# Patient Record
Sex: Female | Born: 1960 | Race: Black or African American | Hispanic: No | Marital: Single | Smoking: Never smoker
Health system: Southern US, Community
[De-identification: ages and names within clinical notes are randomized; demographics above are authoritative.]

## PROBLEM LIST (undated history)

## (undated) DIAGNOSIS — R7303 Prediabetes: Secondary | ICD-10-CM

## (undated) DIAGNOSIS — M549 Dorsalgia, unspecified: Secondary | ICD-10-CM

## (undated) DIAGNOSIS — E785 Hyperlipidemia, unspecified: Secondary | ICD-10-CM

## (undated) DIAGNOSIS — D649 Anemia, unspecified: Secondary | ICD-10-CM

## (undated) DIAGNOSIS — G709 Myoneural disorder, unspecified: Secondary | ICD-10-CM

## (undated) DIAGNOSIS — F419 Anxiety disorder, unspecified: Secondary | ICD-10-CM

## (undated) DIAGNOSIS — T7840XA Allergy, unspecified, initial encounter: Secondary | ICD-10-CM

## (undated) DIAGNOSIS — M1990 Unspecified osteoarthritis, unspecified site: Secondary | ICD-10-CM

## (undated) DIAGNOSIS — J209 Acute bronchitis, unspecified: Secondary | ICD-10-CM

## (undated) DIAGNOSIS — R600 Localized edema: Secondary | ICD-10-CM

## (undated) DIAGNOSIS — I10 Essential (primary) hypertension: Secondary | ICD-10-CM

## (undated) DIAGNOSIS — M2550 Pain in unspecified joint: Secondary | ICD-10-CM

## (undated) HISTORY — DX: Hyperlipidemia, unspecified: E78.5

## (undated) HISTORY — PX: BREAST LUMPECTOMY: SHX2

## (undated) HISTORY — PX: MYOMECTOMY: SHX85

## (undated) HISTORY — PX: COLONOSCOPY: SHX174

## (undated) HISTORY — DX: Myoneural disorder, unspecified: G70.9

## (undated) HISTORY — DX: Acute bronchitis, unspecified: J20.9

## (undated) HISTORY — DX: Dorsalgia, unspecified: M54.9

## (undated) HISTORY — DX: Prediabetes: R73.03

## (undated) HISTORY — DX: Allergy, unspecified, initial encounter: T78.40XA

## (undated) HISTORY — DX: Essential (primary) hypertension: I10

## (undated) HISTORY — DX: Localized edema: R60.0

## (undated) HISTORY — DX: Unspecified osteoarthritis, unspecified site: M19.90

## (undated) HISTORY — DX: Anxiety disorder, unspecified: F41.9

## (undated) HISTORY — DX: Pain in unspecified joint: M25.50

## (undated) HISTORY — PX: POLYPECTOMY: SHX149

## (undated) HISTORY — DX: Anemia, unspecified: D64.9

---

## 1996-04-21 HISTORY — PX: ABDOMINAL HYSTERECTOMY: SHX81

## 1998-10-22 ENCOUNTER — Ambulatory Visit (HOSPITAL_COMMUNITY): Admission: RE | Admit: 1998-10-22 | Discharge: 1998-10-22 | Payer: Self-pay | Admitting: Endocrinology

## 1998-10-22 ENCOUNTER — Encounter: Payer: Self-pay | Admitting: Endocrinology

## 1998-11-29 ENCOUNTER — Ambulatory Visit (HOSPITAL_COMMUNITY): Admission: RE | Admit: 1998-11-29 | Discharge: 1998-11-29 | Payer: Self-pay | Admitting: Nurse Practitioner

## 1998-11-29 ENCOUNTER — Encounter: Payer: Self-pay | Admitting: Internal Medicine

## 1998-12-06 ENCOUNTER — Ambulatory Visit (HOSPITAL_COMMUNITY): Admission: RE | Admit: 1998-12-06 | Discharge: 1998-12-06 | Payer: Self-pay | Admitting: Nurse Practitioner

## 1998-12-12 ENCOUNTER — Encounter (INDEPENDENT_AMBULATORY_CARE_PROVIDER_SITE_OTHER): Payer: Self-pay | Admitting: Specialist

## 1998-12-12 ENCOUNTER — Other Ambulatory Visit: Admission: RE | Admit: 1998-12-12 | Discharge: 1998-12-12 | Payer: Self-pay | Admitting: Gastroenterology

## 2001-09-27 ENCOUNTER — Other Ambulatory Visit: Admission: RE | Admit: 2001-09-27 | Discharge: 2001-09-27 | Payer: Self-pay | Admitting: Family Medicine

## 2004-05-06 ENCOUNTER — Ambulatory Visit: Payer: Self-pay | Admitting: Family Medicine

## 2004-05-09 ENCOUNTER — Ambulatory Visit: Payer: Self-pay | Admitting: Family Medicine

## 2004-05-13 ENCOUNTER — Ambulatory Visit: Payer: Self-pay | Admitting: Family Medicine

## 2004-05-16 ENCOUNTER — Ambulatory Visit: Payer: Self-pay

## 2004-11-18 ENCOUNTER — Ambulatory Visit: Payer: Self-pay | Admitting: Family Medicine

## 2004-11-18 ENCOUNTER — Encounter: Admission: RE | Admit: 2004-11-18 | Discharge: 2004-11-18 | Payer: Self-pay | Admitting: Family Medicine

## 2004-12-05 ENCOUNTER — Ambulatory Visit: Payer: Self-pay | Admitting: Family Medicine

## 2004-12-05 ENCOUNTER — Other Ambulatory Visit: Admission: RE | Admit: 2004-12-05 | Discharge: 2004-12-05 | Payer: Self-pay | Admitting: Family Medicine

## 2005-09-18 ENCOUNTER — Ambulatory Visit: Payer: Self-pay | Admitting: Family Medicine

## 2005-10-07 ENCOUNTER — Ambulatory Visit: Payer: Self-pay | Admitting: Family Medicine

## 2005-10-30 ENCOUNTER — Ambulatory Visit: Payer: Self-pay

## 2005-10-30 ENCOUNTER — Ambulatory Visit: Payer: Self-pay | Admitting: Family Medicine

## 2005-10-30 ENCOUNTER — Encounter: Payer: Self-pay | Admitting: Cardiology

## 2005-11-21 ENCOUNTER — Ambulatory Visit: Payer: Self-pay | Admitting: Family Medicine

## 2005-11-24 ENCOUNTER — Ambulatory Visit: Payer: Self-pay

## 2005-12-16 ENCOUNTER — Ambulatory Visit: Payer: Self-pay | Admitting: Family Medicine

## 2006-01-02 ENCOUNTER — Ambulatory Visit: Payer: Self-pay | Admitting: Family Medicine

## 2006-02-25 ENCOUNTER — Ambulatory Visit: Payer: Self-pay | Admitting: Family Medicine

## 2006-05-22 ENCOUNTER — Ambulatory Visit: Payer: Self-pay | Admitting: Family Medicine

## 2006-05-22 LAB — CONVERTED CEMR LAB
BUN: 14 mg/dL (ref 6–23)
CO2: 32 meq/L (ref 19–32)
Calcium: 9.8 mg/dL (ref 8.4–10.5)
Chloride: 102 meq/L (ref 96–112)
Creatinine, Ser: 0.84 mg/dL (ref 0.40–1.20)
Glucose, Bld: 95 mg/dL (ref 70–99)
Potassium: 4.2 meq/L (ref 3.5–5.3)
Sodium: 139 meq/L (ref 135–145)

## 2006-06-23 ENCOUNTER — Ambulatory Visit: Payer: Self-pay | Admitting: Family Medicine

## 2006-08-05 ENCOUNTER — Encounter: Payer: Self-pay | Admitting: Family Medicine

## 2006-08-05 ENCOUNTER — Ambulatory Visit: Payer: Self-pay | Admitting: Family Medicine

## 2006-08-05 DIAGNOSIS — I209 Angina pectoris, unspecified: Secondary | ICD-10-CM | POA: Insufficient documentation

## 2006-08-05 DIAGNOSIS — J309 Allergic rhinitis, unspecified: Secondary | ICD-10-CM | POA: Insufficient documentation

## 2006-08-05 DIAGNOSIS — R05 Cough: Secondary | ICD-10-CM

## 2006-08-05 DIAGNOSIS — R079 Chest pain, unspecified: Secondary | ICD-10-CM | POA: Insufficient documentation

## 2006-08-05 DIAGNOSIS — E785 Hyperlipidemia, unspecified: Secondary | ICD-10-CM | POA: Insufficient documentation

## 2006-08-05 DIAGNOSIS — E1169 Type 2 diabetes mellitus with other specified complication: Secondary | ICD-10-CM | POA: Insufficient documentation

## 2006-08-05 DIAGNOSIS — I10 Essential (primary) hypertension: Secondary | ICD-10-CM | POA: Insufficient documentation

## 2006-08-05 DIAGNOSIS — R059 Cough, unspecified: Secondary | ICD-10-CM | POA: Insufficient documentation

## 2006-08-13 ENCOUNTER — Ambulatory Visit: Payer: Self-pay | Admitting: Cardiology

## 2007-02-05 ENCOUNTER — Encounter: Payer: Self-pay | Admitting: Family Medicine

## 2007-02-18 ENCOUNTER — Encounter (INDEPENDENT_AMBULATORY_CARE_PROVIDER_SITE_OTHER): Payer: Self-pay | Admitting: *Deleted

## 2007-06-07 ENCOUNTER — Ambulatory Visit: Payer: Self-pay | Admitting: Family Medicine

## 2007-06-07 DIAGNOSIS — R609 Edema, unspecified: Secondary | ICD-10-CM | POA: Insufficient documentation

## 2007-06-11 ENCOUNTER — Telehealth (INDEPENDENT_AMBULATORY_CARE_PROVIDER_SITE_OTHER): Payer: Self-pay | Admitting: *Deleted

## 2007-06-17 ENCOUNTER — Ambulatory Visit: Payer: Self-pay | Admitting: Family Medicine

## 2007-06-17 DIAGNOSIS — J189 Pneumonia, unspecified organism: Secondary | ICD-10-CM | POA: Insufficient documentation

## 2007-06-25 ENCOUNTER — Ambulatory Visit: Payer: Self-pay | Admitting: Family Medicine

## 2007-07-13 ENCOUNTER — Telehealth (INDEPENDENT_AMBULATORY_CARE_PROVIDER_SITE_OTHER): Payer: Self-pay | Admitting: *Deleted

## 2007-07-14 ENCOUNTER — Telehealth: Payer: Self-pay | Admitting: Family Medicine

## 2007-07-14 ENCOUNTER — Encounter: Payer: Self-pay | Admitting: Family Medicine

## 2007-07-19 ENCOUNTER — Telehealth (INDEPENDENT_AMBULATORY_CARE_PROVIDER_SITE_OTHER): Payer: Self-pay | Admitting: *Deleted

## 2007-07-19 ENCOUNTER — Ambulatory Visit: Payer: Self-pay | Admitting: Family Medicine

## 2007-07-19 DIAGNOSIS — J209 Acute bronchitis, unspecified: Secondary | ICD-10-CM | POA: Insufficient documentation

## 2007-07-19 DIAGNOSIS — J90 Pleural effusion, not elsewhere classified: Secondary | ICD-10-CM | POA: Insufficient documentation

## 2007-07-19 LAB — CONVERTED CEMR LAB: Inflenza A Ag: NEGATIVE

## 2007-07-20 ENCOUNTER — Ambulatory Visit: Payer: Self-pay | Admitting: Internal Medicine

## 2007-07-23 ENCOUNTER — Telehealth (INDEPENDENT_AMBULATORY_CARE_PROVIDER_SITE_OTHER): Payer: Self-pay | Admitting: *Deleted

## 2007-08-02 ENCOUNTER — Telehealth (INDEPENDENT_AMBULATORY_CARE_PROVIDER_SITE_OTHER): Payer: Self-pay | Admitting: *Deleted

## 2007-08-02 ENCOUNTER — Encounter: Payer: Self-pay | Admitting: Family Medicine

## 2007-09-16 ENCOUNTER — Telehealth (INDEPENDENT_AMBULATORY_CARE_PROVIDER_SITE_OTHER): Payer: Self-pay | Admitting: *Deleted

## 2007-10-18 ENCOUNTER — Ambulatory Visit: Payer: Self-pay | Admitting: Family Medicine

## 2007-10-18 DIAGNOSIS — M79609 Pain in unspecified limb: Secondary | ICD-10-CM | POA: Insufficient documentation

## 2008-01-18 ENCOUNTER — Ambulatory Visit: Payer: Self-pay | Admitting: Family Medicine

## 2008-01-21 ENCOUNTER — Telehealth (INDEPENDENT_AMBULATORY_CARE_PROVIDER_SITE_OTHER): Payer: Self-pay | Admitting: *Deleted

## 2008-01-30 LAB — CONVERTED CEMR LAB
ALT: 39 units/L — ABNORMAL HIGH (ref 0–35)
AST: 40 units/L — ABNORMAL HIGH (ref 0–37)
Albumin: 3.7 g/dL (ref 3.5–5.2)
Alkaline Phosphatase: 53 units/L (ref 39–117)
BUN: 11 mg/dL (ref 6–23)
Bilirubin, Direct: 0.1 mg/dL (ref 0.0–0.3)
CO2: 30 meq/L (ref 19–32)
Calcium: 9.1 mg/dL (ref 8.4–10.5)
Chloride: 100 meq/L (ref 96–112)
Cholesterol: 144 mg/dL (ref 0–200)
Creatinine, Ser: 0.8 mg/dL (ref 0.4–1.2)
GFR calc Af Amer: 98 mL/min
GFR calc non Af Amer: 81 mL/min
Glucose, Bld: 89 mg/dL (ref 70–99)
HDL: 46.1 mg/dL (ref >39.0)
Hgb A1c MFr Bld: 6.5 % — ABNORMAL HIGH (ref 4.6–6.0)
LDL Cholesterol: 82 mg/dL (ref 0–99)
Potassium: 4 meq/L (ref 3.5–5.1)
Sodium: 138 meq/L (ref 135–145)
Total Bilirubin: 0.5 mg/dL (ref 0.3–1.2)
Total CHOL/HDL Ratio: 3.1
Total Protein: 6.9 g/dL (ref 6.0–8.3)
Triglycerides: 81 mg/dL (ref 0–149)
VLDL: 16 mg/dL (ref 0–40)

## 2008-01-31 ENCOUNTER — Encounter (INDEPENDENT_AMBULATORY_CARE_PROVIDER_SITE_OTHER): Payer: Self-pay | Admitting: *Deleted

## 2008-02-02 ENCOUNTER — Telehealth (INDEPENDENT_AMBULATORY_CARE_PROVIDER_SITE_OTHER): Payer: Self-pay | Admitting: *Deleted

## 2008-02-07 ENCOUNTER — Encounter (INDEPENDENT_AMBULATORY_CARE_PROVIDER_SITE_OTHER): Payer: Self-pay | Admitting: *Deleted

## 2008-02-07 ENCOUNTER — Encounter: Payer: Self-pay | Admitting: Family Medicine

## 2008-07-31 ENCOUNTER — Encounter: Payer: Self-pay | Admitting: Family Medicine

## 2008-08-15 IMAGING — CT CT ANGIO CHEST
2 of 4 series · 19 of 46 positions shown · IV contrast (Omnipaque 300)
Comparison: None

CLINICAL DATA: Elevated D-dimer.  Chest pain and pleural effusion.

CT ANGIOGRAPHY CHEST
TECHNIQUE: Multidetector CT imaging of the chest using the
standard protocol during bolus administration of intravenous
contrast. Multiplanar reconstructed images obtained and reviewed to
evaluate the vascular anatomy.
Contrast: 80 ml Omnipaque

[Series 5: pe_xxl 1.0 b20f st · axial · 0.74mm/px · z∈[-322,-106]mm · 16 of 238 slices shown]
[im 11/238  lung]
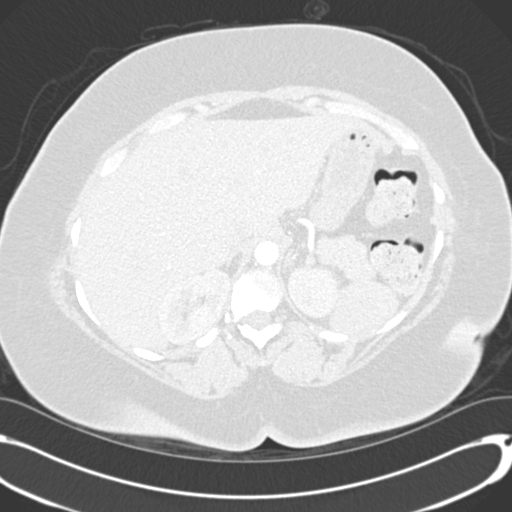
[im 31/238  soft-tissue]
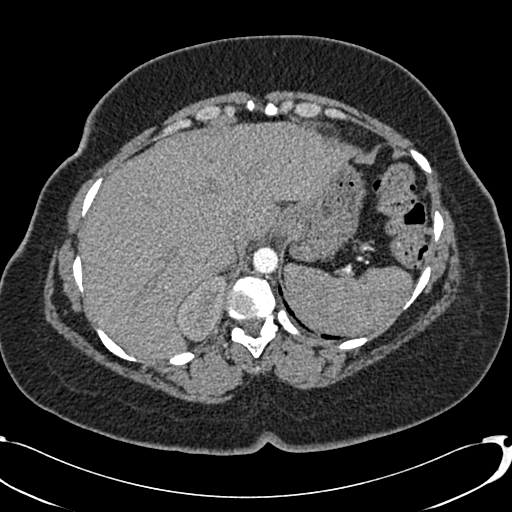
[im 42/238  lung]
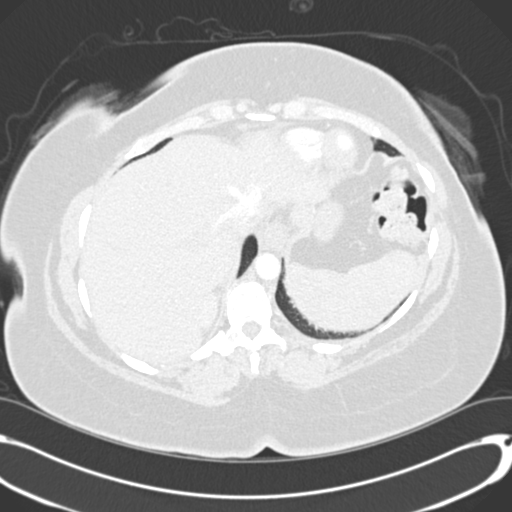
[im 52/238  soft-tissue]
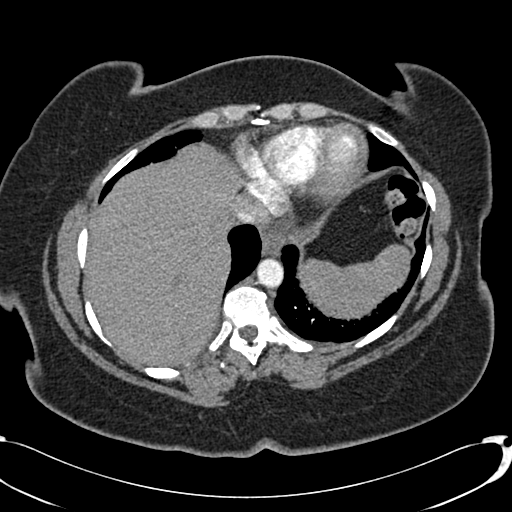
[im 73/238  lung]
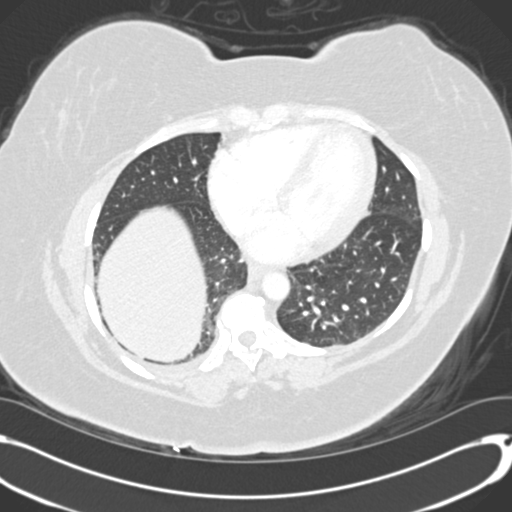
[im 83/238  soft-tissue]
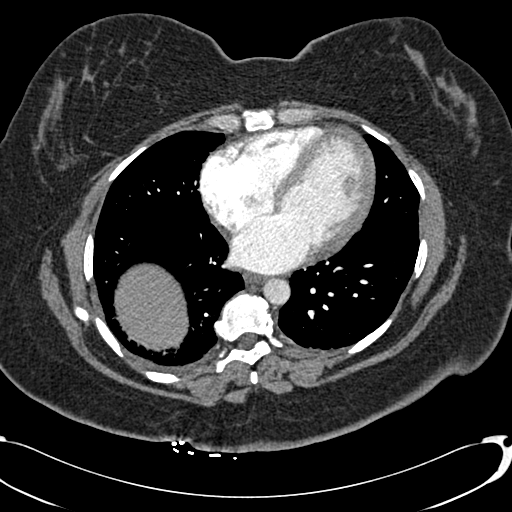
[im 93/238  lung]
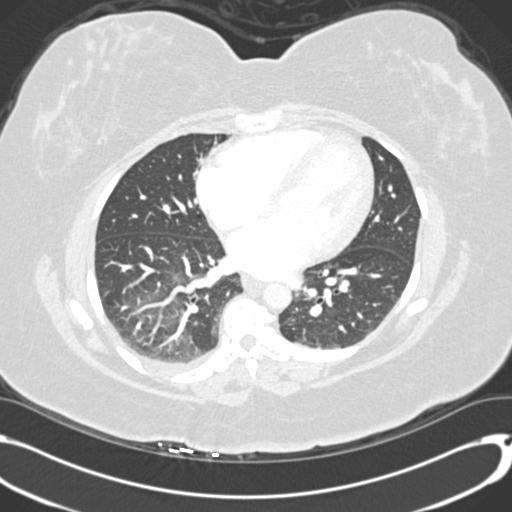
[im 114/238  soft-tissue]
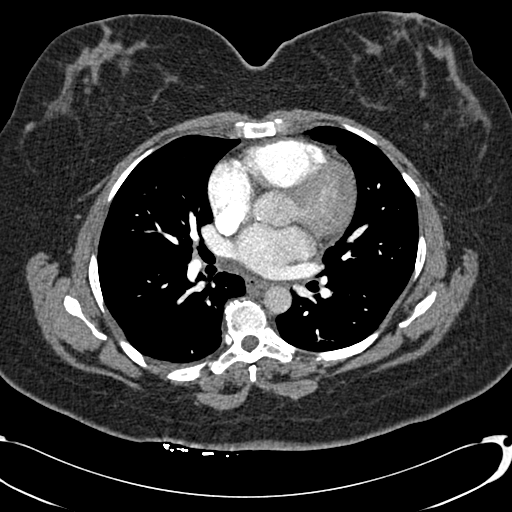
[im 124/238  lung]
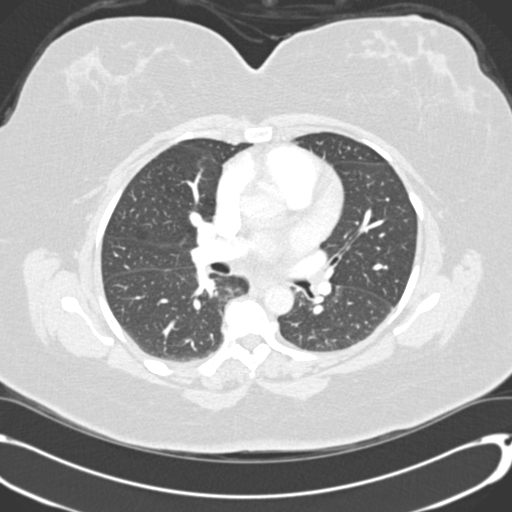
[im 145/238  soft-tissue]
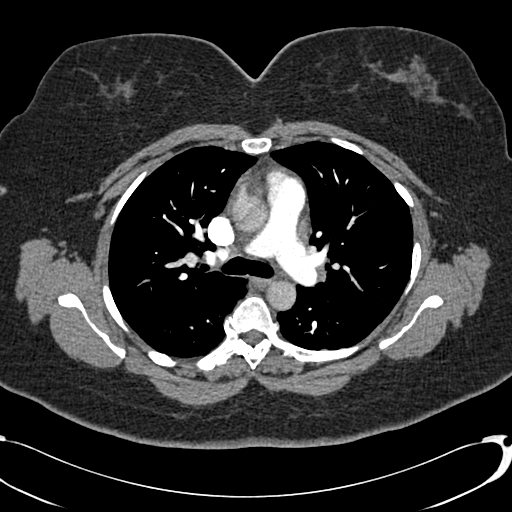
[im 155/238  lung]
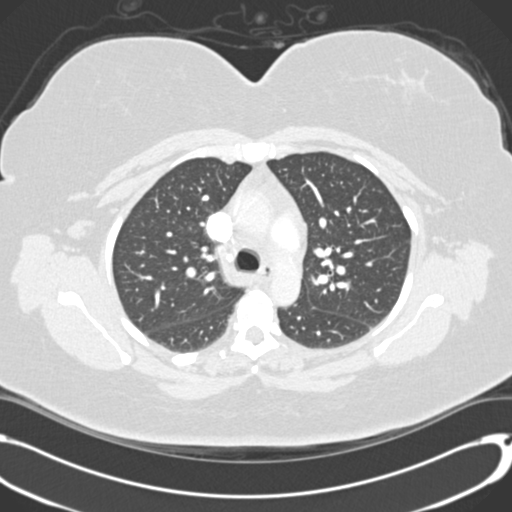
[im 165/238  soft-tissue]
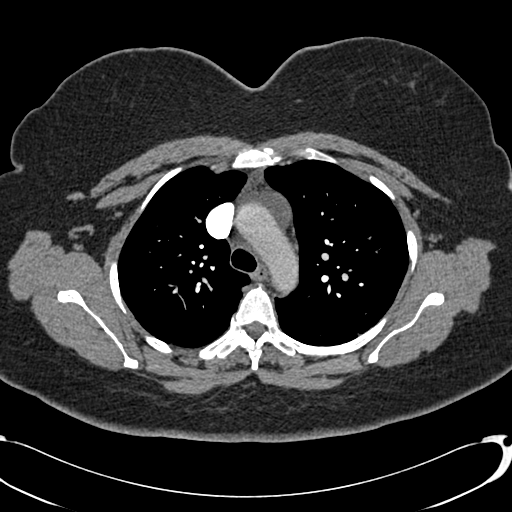
[im 186/238  lung]
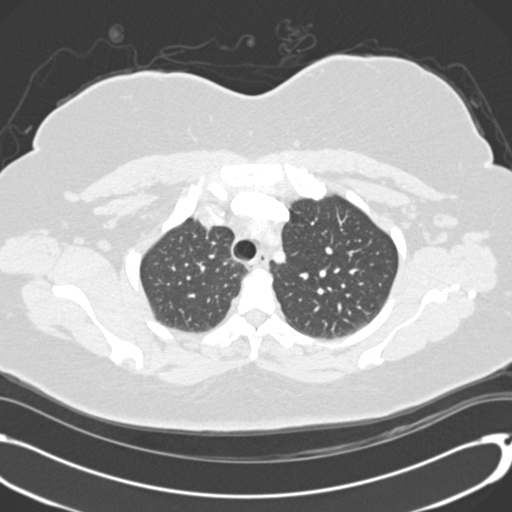
[im 196/238  soft-tissue]
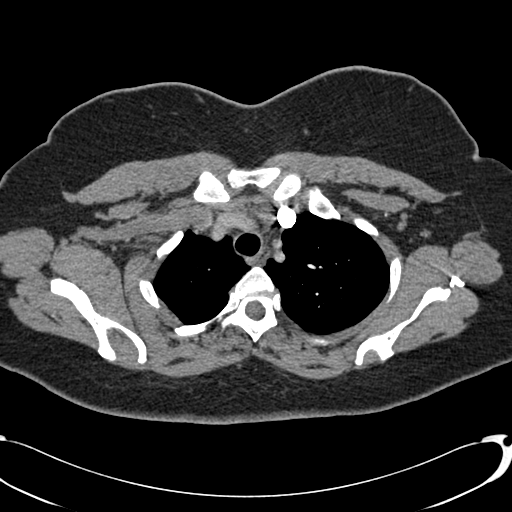
[im 207/238  lung]
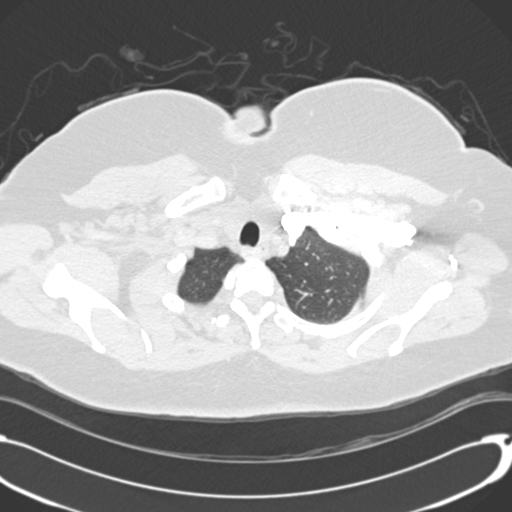
[im 227/238  soft-tissue]
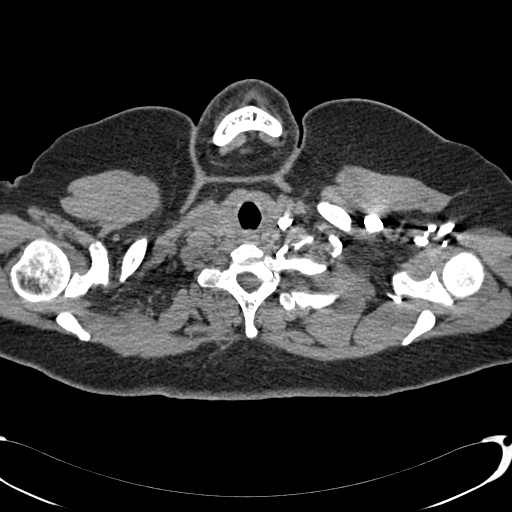

[Series 602: <mpr thick range> · coronal · 0.74mm/px · 3 of 106 slices shown]
[im 36/106  soft-tissue]
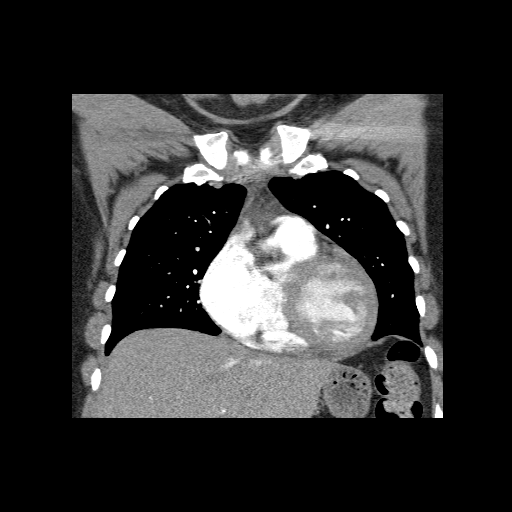
[im 47/106  soft-tissue]
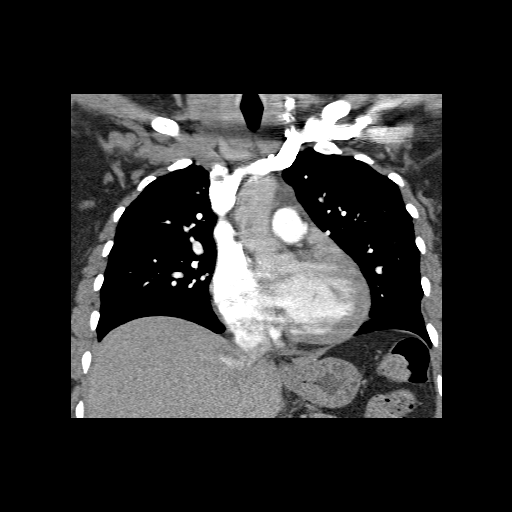
[im 59/106  soft-tissue]
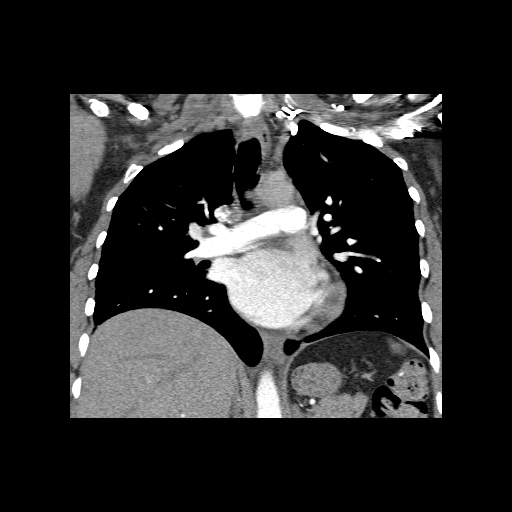

[19 of 46 positions shown; findings below may reference images not displayed]

FINDINGS: There are no abnormal filling defects within the main pulmonary
artery or its branches to suggest an acute pulmonary embolus.

No enlarged axillary lymph nodes.

No enlarged supraclavicular lymph nodes.

There is no pericardial effusion.  There is a small right effusion.

Tiny nodule within the right middle lobe measures 2.9 mm, image 35.

Adjacent nodule measures 3.1 mm, image 40.

At the right lung base there is a 2.1 mm nodule, image 47.

There is no abnormal areas of airspace consolidation.

Review of the visualized osseous structures is unremarkable.
IMPRESSION: 1.  No acute pulmonary embolus.
2.  Small nonspecific pulmonary nodules.  If this patient is at
risk for lung cancer I would recommend a follow-up examination in
12 months.

## 2008-08-24 ENCOUNTER — Ambulatory Visit: Payer: Self-pay | Admitting: Family Medicine

## 2008-08-24 ENCOUNTER — Other Ambulatory Visit: Admission: RE | Admit: 2008-08-24 | Discharge: 2008-08-24 | Payer: Self-pay | Admitting: Family Medicine

## 2008-08-24 ENCOUNTER — Encounter: Payer: Self-pay | Admitting: Family Medicine

## 2008-08-24 DIAGNOSIS — H409 Unspecified glaucoma: Secondary | ICD-10-CM | POA: Insufficient documentation

## 2008-08-24 DIAGNOSIS — R7309 Other abnormal glucose: Secondary | ICD-10-CM | POA: Insufficient documentation

## 2008-08-24 DIAGNOSIS — Z780 Asymptomatic menopausal state: Secondary | ICD-10-CM | POA: Insufficient documentation

## 2008-08-25 ENCOUNTER — Encounter (INDEPENDENT_AMBULATORY_CARE_PROVIDER_SITE_OTHER): Payer: Self-pay | Admitting: *Deleted

## 2008-08-28 ENCOUNTER — Encounter (INDEPENDENT_AMBULATORY_CARE_PROVIDER_SITE_OTHER): Payer: Self-pay | Admitting: *Deleted

## 2008-08-29 ENCOUNTER — Encounter: Payer: Self-pay | Admitting: Family Medicine

## 2008-09-08 ENCOUNTER — Encounter (INDEPENDENT_AMBULATORY_CARE_PROVIDER_SITE_OTHER): Payer: Self-pay | Admitting: *Deleted

## 2008-12-22 ENCOUNTER — Ambulatory Visit: Payer: Self-pay | Admitting: Family Medicine

## 2008-12-22 DIAGNOSIS — F438 Other reactions to severe stress: Secondary | ICD-10-CM | POA: Insufficient documentation

## 2008-12-26 ENCOUNTER — Ambulatory Visit: Payer: Self-pay

## 2008-12-26 ENCOUNTER — Encounter: Payer: Self-pay | Admitting: Family Medicine

## 2009-01-01 ENCOUNTER — Telehealth: Payer: Self-pay | Admitting: Family Medicine

## 2009-01-08 ENCOUNTER — Encounter: Payer: Self-pay | Admitting: Family Medicine

## 2009-02-27 ENCOUNTER — Ambulatory Visit: Payer: Self-pay | Admitting: Family Medicine

## 2009-02-27 ENCOUNTER — Telehealth (INDEPENDENT_AMBULATORY_CARE_PROVIDER_SITE_OTHER): Payer: Self-pay | Admitting: *Deleted

## 2009-03-05 ENCOUNTER — Encounter: Payer: Self-pay | Admitting: Family Medicine

## 2009-03-05 LAB — CONVERTED CEMR LAB
Chloride: 104 meq/L (ref 96–112)
GFR calc non Af Amer: 97.79 mL/min (ref >60)
Hgb A1c MFr Bld: 6.4 % (ref 4.6–6.5)
Potassium: 4.3 meq/L (ref 3.5–5.1)
Sodium: 142 meq/L (ref 135–145)

## 2009-03-30 ENCOUNTER — Encounter (INDEPENDENT_AMBULATORY_CARE_PROVIDER_SITE_OTHER): Payer: Self-pay | Admitting: *Deleted

## 2009-04-06 ENCOUNTER — Encounter: Payer: Self-pay | Admitting: Family Medicine

## 2009-04-11 ENCOUNTER — Encounter: Payer: Self-pay | Admitting: Family Medicine

## 2009-04-19 ENCOUNTER — Ambulatory Visit: Payer: Self-pay | Admitting: Family Medicine

## 2009-04-19 DIAGNOSIS — J0140 Acute pansinusitis, unspecified: Secondary | ICD-10-CM | POA: Insufficient documentation

## 2009-04-19 DIAGNOSIS — J019 Acute sinusitis, unspecified: Secondary | ICD-10-CM

## 2009-04-24 ENCOUNTER — Encounter: Payer: Self-pay | Admitting: Family Medicine

## 2009-05-16 ENCOUNTER — Telehealth (INDEPENDENT_AMBULATORY_CARE_PROVIDER_SITE_OTHER): Payer: Self-pay | Admitting: *Deleted

## 2009-05-22 ENCOUNTER — Ambulatory Visit: Payer: Self-pay | Admitting: Family

## 2009-05-29 ENCOUNTER — Encounter (INDEPENDENT_AMBULATORY_CARE_PROVIDER_SITE_OTHER): Payer: Self-pay | Admitting: *Deleted

## 2009-07-19 ENCOUNTER — Encounter: Payer: Self-pay | Admitting: Family Medicine

## 2009-07-25 ENCOUNTER — Encounter: Payer: Self-pay | Admitting: Family Medicine

## 2009-08-02 ENCOUNTER — Encounter (INDEPENDENT_AMBULATORY_CARE_PROVIDER_SITE_OTHER): Payer: Self-pay | Admitting: *Deleted

## 2009-08-30 ENCOUNTER — Ambulatory Visit: Payer: Self-pay | Admitting: Family Medicine

## 2009-08-30 ENCOUNTER — Telehealth (INDEPENDENT_AMBULATORY_CARE_PROVIDER_SITE_OTHER): Payer: Self-pay | Admitting: *Deleted

## 2009-08-31 ENCOUNTER — Encounter: Payer: Self-pay | Admitting: Family Medicine

## 2009-10-12 ENCOUNTER — Encounter (INDEPENDENT_AMBULATORY_CARE_PROVIDER_SITE_OTHER): Payer: Self-pay

## 2009-10-16 ENCOUNTER — Ambulatory Visit: Payer: Self-pay | Admitting: Gastroenterology

## 2009-10-30 ENCOUNTER — Ambulatory Visit: Payer: Self-pay | Admitting: Gastroenterology

## 2009-10-31 ENCOUNTER — Encounter: Payer: Self-pay | Admitting: Gastroenterology

## 2009-12-04 ENCOUNTER — Ambulatory Visit: Payer: Self-pay | Admitting: Family Medicine

## 2009-12-05 LAB — CONVERTED CEMR LAB
ALT: 14 units/L (ref 0–35)
AST: 17 units/L (ref 0–37)
Albumin: 3.9 g/dL (ref 3.5–5.2)
Calcium: 9.6 mg/dL (ref 8.4–10.5)
GFR calc non Af Amer: 111.88 mL/min (ref >60)
HDL: 47.8 mg/dL (ref >39.00)
Hgb A1c MFr Bld: 6.3 % (ref 4.6–6.5)
Sodium: 144 meq/L (ref 135–145)
Total Protein: 6.7 g/dL (ref 6.0–8.3)
Triglycerides: 59 mg/dL (ref 0.0–149.0)
VLDL: 11.8 mg/dL (ref 0.0–40.0)

## 2010-02-18 ENCOUNTER — Ambulatory Visit: Payer: Self-pay | Admitting: Family Medicine

## 2010-02-20 ENCOUNTER — Ambulatory Visit: Payer: Self-pay | Admitting: Family Medicine

## 2010-02-20 DIAGNOSIS — J029 Acute pharyngitis, unspecified: Secondary | ICD-10-CM | POA: Insufficient documentation

## 2010-05-19 LAB — CONVERTED CEMR LAB
Bilirubin Urine: NEGATIVE
Blood in Urine, dipstick: NEGATIVE
CO2: 31 meq/L (ref 19–32)
Calcium: 9.9 mg/dL (ref 8.4–10.5)
GFR calc non Af Amer: 97.99 mL/min (ref >60)
Glucose, Urine, Semiquant: NEGATIVE
Ketones, urine, test strip: NEGATIVE
Nitrite: NEGATIVE
Potassium: 4.4 meq/L (ref 3.5–5.1)
Protein, U semiquant: NEGATIVE
Sodium: 141 meq/L (ref 135–145)
Specific Gravity, Urine: 1.015
Urobilinogen, UA: NEGATIVE
WBC Urine, dipstick: NEGATIVE
pH: 5

## 2010-05-21 ENCOUNTER — Telehealth (INDEPENDENT_AMBULATORY_CARE_PROVIDER_SITE_OTHER): Payer: Self-pay | Admitting: *Deleted

## 2010-05-23 NOTE — Therapy (Signed)
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

## 2010-05-23 NOTE — Letter (Signed)
Summary: Primary Care Appointment Letter  [facility]  [address] [place], [place] [address]   Phone: [phone]  Fax: [phone]    08/02/2009 MRN: [id number]  Heather [name] [address] [place], [place]  [address]  Dear [name],   Your Primary Care Physician Loreen Freud DO has indicated that:    _______it is time to schedule an appointment.    _______you missed your appointment on______ and need to call and          reschedule.    __X_____you need to have lab work done.    _______you need to schedule an appointment discuss lab or test results.    _______you need to call to reschedule your appointment that is                       scheduled on _________.     Please call our office as soon as possible. Our phone number is [phone]________. Please press option 1. Our office is open 8a-12noon and 1p-5p, Monday through Friday.     Thank you,    [facility]

## 2010-05-23 NOTE — Progress Notes (Signed)
Summary: Prior Auth APPROVED  for [facility]  Phone Note Refill Request Call back at [phone] Message from:  Pharmacy on Aug 30, 2009 4:06 PM  Refills Requested: Medication #1:  LOVAZA 1 GM CAPS 2 by mouth two times a day.   Dosage confirmed as above?Dosage Confirmed   Supply Requested: 3 months Prior Auth [id number] ID#: [id number] [facility] [address]   Next Appointment Scheduled: today Initial call taken by: Harold Barban,  Aug 30, 2009 4:07 PM  Follow-up for Phone Call        Allena Katz [facility]  CASE ID #[id number]  PRIOR AUTH APPROVED FROM 08/10/09 TO 08/31/10 .Kandice Hams  Sep 04, 2009 9:17 AM  Follow-up by: Kandice Hams,  Sep 04, 2009 9:17 AM  Additional Follow-up for Phone Call Additional follow up Details #1::        [facility] [address] informed .Kandice Hams  Sep 04, 2009 9:18 AM

## 2010-05-23 NOTE — Assessment & Plan Note (Signed)
Summary: pain in rt side of mouth and throat///sph   Vital Signs:  Patient profile:   50 year old female Menstrual status:  hysterectomy Weight:      262.8 pounds Temp:     98.3 degrees F oral BP sitting:   120 / 80  (left arm) Cuff size:   large  Vitals Entered By: Almeta Monas CMA Duncan Dull) (February 20, 2010 11:58 AM) CC: c/o of pain on the right side of throat since this morning--pt on Augmentin since Monday   History of Present Illness: Pt here stating that she was feeling better but then this am she woke up with a sore throat and she feels a lump in the back of her throat.  See last visit.  Current Medications (verified): 1)  Lisinopril 10 Mg  Tabs (Lisinopril) .Marland Kitchen.. 1 By Mouth Once Daily 2)  Lasix 20 Mg  Tabs (Furosemide) .Marland Kitchen.. 1 By Mouth Once Daily 3)  Zocor 40 Mg  Tabs (Simvastatin) .Marland Kitchen.. 1 By Mouth At Bedtime. Needs Labwork. 4)  Combigan 0.2-0.5 % Soln (Brimonidine Tartrate-Timolol) 5)  [facility]vs Iron 325 (65 Fe) Mg Tabs (Ferrous Sulfate) .... Take One Tab By Mouth Once Daily 6)  Vitamin B-12 1000 Mcg Tabs (Cyanocobalamin) .... Take As Directed 7)  Calcium 500/d 500-200 Mg-Unit Tabs (Calcium Carbonate-Vitamin D) .... Take As Directed 8)  Combigan 0.2-0.5 % Soln (Brimonidine Tartrate-Timolol) .Marland Kitchen.. 1gtt Two Times A Day 9)  Vitamin D 400 Unit Caps (Cholecalciferol) .Marland Kitchen.. 1 By Mouth Once Daily 10)  Vitamin C 500 Mg Tabs (Ascorbic Acid) .Marland Kitchen.. 1 By Mouth Once Daily 11)  Multivitamins  Tabs (Multiple Vitamin) .Marland Kitchen.. 1 By Mouth Once Daily 12)  Alprazolam 0.25 Mg Tabs (Alprazolam) .Marland Kitchen.. 1 By Mouth Three Times A Day As Needed 13)  Veramyst 27.5 Mcg/spray Susp (Fluticasone Furoate) .... 2 Sprays Each Nostril Once Daily 14)  Cefdinir 300 Mg Caps (Cefdinir) .... One Cap By Mouth Two Times A Day 10 Days 15)  Tessalon Perles 100 Mg Caps (Benzonatate) .... One Cap By Mouth Three Times A Day 16)  Lovaza 1 Gm Caps (Omega-3-Acid Ethyl Esters) .... 2 By Mouth Two Times A Day 17)  Augmentin 875-125 Mg Tabs  (Amoxicillin-Pot Clavulanate) .Marland Kitchen.. 1 By Mouth Two Times A Day 18)  Prednisone 10 Mg Tabs (Prednisone) .... 3 By Mouth Once Daily For 3 Days Then 2 By Mouth Once Daily For 3 Days Then 1 By Mouth Once Daily For 3 Days  Allergies (verified): 1)  ! * Tamaflu  Past History:  Past medical, surgical, family and social histories (including risk factors) reviewed for relevance to current acute and chronic problems.  Past Medical History: Reviewed history from 08/24/2008 and no changes required. Allergic rhinitis Hyperlipidemia Hypertension Current Problems:  GLAUCOMA (ICD-365.9) LEG PAIN, LEFT (ICD-729.5) PLEURAL EFFUSION (ICD-511.9) ACUTE BRONCHITIS (ICD-466.0) PNEUMONIA, ORGANISM UNSPECIFIED (ICD-486) EDEMA (ICD-782.3) CHEST PAIN (ICD-786.50) COUGH (ICD-786.2) FAMILY HISTORY DIABETES 1ST DEGREE RELATIVE (ICD-V18.0) HYPERTENSION (ICD-401.9) HYPERLIPIDEMIA (ICD-272.4) ALLERGIC RHINITIS (ICD-477.9)  Past Surgical History: Reviewed history from 08/24/2008 and no changes required. Hysterectomy (1998)--TAH Lumpectomy  Family History: Reviewed history from 08/30/2009 and no changes required. Family History Diabetes 1st degree relative Family History High cholesterol Family History Hypertension Family History of Endometrial cancer Family History of Stroke M 1st degree relative at 51 yo secondary to a fib F--Dementia   Social History: Reviewed history from 08/24/2008 and no changes required. Occupation: Nurse, learning disability lab Never Smoked Divorced Alcohol use-no Drug use-no Regular exercise-yes  Review of Systems  See HPI  Physical Exam  General:  Well-developed,well-nourished,in no acute distress; alert,appropriate and cooperative throughout examination Mouth:  pharyngeal erythema.   tonsils slightly enlarged Neck:  supple and cervical lymphadenopathy.   Lungs:  Normal respiratory effort, chest expands symmetrically. Lungs are clear to auscultation, no crackles or  wheezes. Heart:  Normal rate and regular rhythm. S1 and S2 normal without gallop, murmur, click, rub or other extra sounds.   Impression & Recommendations:  Problem # 1:  SINUSITIS - ACUTE-NOS (ICD-461.9)  Her updated medication list for this problem includes:    Veramyst 27.5 Mcg/spray Susp (Fluticasone furoate) .Marland Kitchen... 2 sprays each nostril once daily    Cefdinir 300 Mg Caps (Cefdinir) ..... One cap by mouth two times a day 10 days    Tessalon Perles 100 Mg Caps (Benzonatate) ..... One cap by mouth three times a day    Augmentin 875-125 Mg Tabs (Amoxicillin-pot clavulanate) .Marland Kitchen... 1 by mouth two times a day  Instructed on treatment. Call if symptoms persist or worsen.   Problem # 2:  ACUTE PHARYNGITIS (ICD-462)  Her updated medication list for this problem includes:    Cefdinir 300 Mg Caps (Cefdinir) ..... One cap by mouth two times a day 10 days    Augmentin 875-125 Mg Tabs (Amoxicillin-pot clavulanate) .Marland Kitchen... 1 by mouth two times a day  Orders: Admin of Therapeutic Inj  intramuscular or subcutaneous (13086) Depo- Medrol 80mg  (J1040)  Complete Medication List: 1)  Lisinopril 10 Mg Tabs (Lisinopril) .Marland Kitchen.. 1 by mouth once daily 2)  Lasix 20 Mg Tabs (Furosemide) .Marland Kitchen.. 1 by mouth once daily 3)  Zocor 40 Mg Tabs (Simvastatin) .Marland Kitchen.. 1 by mouth at bedtime. needs labwork. 4)  Combigan 0.2-0.5 % Soln (Brimonidine tartrate-timolol) 5)  Cvs Iron 325 (65 Fe) Mg Tabs (Ferrous sulfate) .... Take one tab by mouth once daily 6)  Vitamin B-12 1000 Mcg Tabs (Cyanocobalamin) .... Take as directed 7)  Calcium 500/d 500-200 Mg-unit Tabs (Calcium carbonate-vitamin d) .... Take as directed 8)  Combigan 0.2-0.5 % Soln (Brimonidine tartrate-timolol) .Marland Kitchen.. 1gtt two times a day 9)  Vitamin D 400 Unit Caps (Cholecalciferol) .Marland Kitchen.. 1 by mouth once daily 10)  Vitamin C 500 Mg Tabs (Ascorbic acid) .Marland Kitchen.. 1 by mouth once daily 11)  Multivitamins Tabs (Multiple vitamin) .Marland Kitchen.. 1 by mouth once daily 12)  Alprazolam 0.25 Mg  Tabs (Alprazolam) .Marland Kitchen.. 1 by mouth three times a day as needed 13)  Veramyst 27.5 Mcg/spray Susp (Fluticasone furoate) .... 2 sprays each nostril once daily 14)  Cefdinir 300 Mg Caps (Cefdinir) .... One cap by mouth two times a day 10 days 15)  Tessalon Perles 100 Mg Caps (Benzonatate) .... One cap by mouth three times a day 16)  Lovaza 1 Gm Caps (Omega-3-acid ethyl esters) .... 2 by mouth two times a day 17)  Augmentin 875-125 Mg Tabs (Amoxicillin-pot clavulanate) .Marland Kitchen.. 1 by mouth two times a day 18)  Prednisone 10 Mg Tabs (Prednisone) .... 3 by mouth once daily for 3 days then 2 by mouth once daily for 3 days then 1 by mouth once daily for 3 days Prescriptions: PREDNISONE 10 MG TABS (PREDNISONE) 3 by mouth once daily for 3 days then 2 by mouth once daily for 3 days then 1 by mouth once daily for 3 days  #18 x 0   Entered and Authorized by:   Loreen Freud DO   Signed by:   Loreen Freud DO on 02/20/2010   Method used:   Electronically to  [facility] [address] (retail)       [phone]       [place], [place]  [address]       Ph: [phone]       Fax: [phone]   RxID:   [id number]    Medication Administration  Injection # 1:    Medication: Depo- Medrol 80mg     Diagnosis: ACUTE PHARYNGITIS (ICD-462)    Route: IM    Site: RUOQ gluteus    Exp Date: 10/20/2010    Lot #: [id number]    Mfr: Pharmacia    Patient tolerated injection without complications    Given by: Almeta Monas CMA Duncan Dull) (February 20, 2010 12:22 PM)  Orders Added: 1)  Admin of Therapeutic Inj  intramuscular or subcutaneous [96372] 2)  Depo- Medrol 80mg  [J1040] 3)  Est. Patient Level III [76160]

## 2010-05-23 NOTE — Medication Information (Signed)
Summary: Prior Authorization for [employer]  Prior Authorization for [employer]   Imported By: Lanelle Bal 09/11/2009 12:16:03  _____________________________________________________________________  External Attachment:    Type:   Image     Comment:   External Document

## 2010-05-23 NOTE — Letter (Signed)
Summary: [facility] Instructions  [facility]  [address] [place], [place] [address]   Phone: [phone]  Fax: [phone]       Heather [name]    1961/03/13    MRN: [id number]        Procedure Day /Date: Tuesday 10/30/09      Arrival Time: 8:00 a.m.     Procedure Time: 9:00 a.m.     Location of Procedure:                    _x_  [facility] (4th Floor)   PREPARATION FOR COLONOSCOPY WITH MOVIPREP   Starting 5 days prior to your procedure  10/25/09 do not eat nuts, seeds, popcorn, corn, beans, peas,  salads, or any raw vegetables.  Do not take any fiber supplements (e.g. Metamucil, Citrucel, and Benefiber).  THE DAY BEFORE YOUR PROCEDURE         DATE:  10/29/09  DAY: Monday  1.  Drink clear liquids the entire day-NO SOLID FOOD  2.  Do not drink anything colored red or purple.  Avoid juices with pulp.  No orange juice.  3.  Drink at least 64 oz. (8 glasses) of fluid/clear liquids during the day to prevent dehydration and help the prep work efficiently.  CLEAR LIQUIDS INCLUDE: Water Jello Ice Popsicles Tea (sugar ok, no milk/cream) Powdered fruit flavored drinks Coffee (sugar ok, no milk/cream) Gatorade Juice: apple, white grape, white cranberry  Lemonade Clear bullion, consomm, broth Carbonated beverages (any kind) Strained chicken noodle soup Hard Candy                             4.  In the morning, mix first dose of MoviPrep solution:    Empty 1 Pouch A and 1 Pouch B into the disposable container    Add lukewarm drinking water to the top line of the container. Mix to dissolve    Refrigerate (mixed solution should be used within 24 hrs)  5.  Begin drinking the prep at 5:00 p.m. The MoviPrep container is divided by 4 marks.   Every 15 minutes drink the solution down to the next mark (approximately 8 oz) until the full liter is complete.   6.  Follow completed prep with 16 oz of clear liquid of your choice (Nothing red or purple).   Continue to drink clear liquids until bedtime.  7.  Before going to bed, mix second dose of MoviPrep solution:    Empty 1 Pouch A and 1 Pouch B into the disposable container    Add lukewarm drinking water to the top line of the container. Mix to dissolve    Refrigerate  THE DAY OF YOUR PROCEDURE      DATE:  10/30/09  DAY:  Tuesday  Beginning at  4:00 a.m. (5 hours before procedure):         1. Every 15 minutes, drink the solution down to the next mark (approx 8 oz) until the full liter is complete.  2. Follow completed prep with 16 oz. of clear liquid of your choice.    3. You may drink clear liquids until  7:00 a.m.  (2 HOURS BEFORE PROCEDURE).   MEDICATION INSTRUCTIONS  Unless otherwise instructed, you should take regular prescription medications with a small sip of water   as early as possible the morning of your procedure   Additional medication instructions: Do not take Furosemide am of  procedure.         OTHER INSTRUCTIONS  You will need a responsible adult at least 51 years of age to accompany you and drive you home.   This person must remain in the waiting room during your procedure.  Wear loose fitting clothing that is easily removed.  Leave jewelry and other valuables at home.  However, you may wish to bring a book to read or  an iPod/MP3 player to listen to music as you wait for your procedure to start.  Remove all body piercing jewelry and leave at home.  Total time from sign-in until discharge is approximately 2-3 hours.  You should go home directly after your procedure and rest.  You can resume normal activities the  day after your procedure.  The day of your procedure you should not:   Drive   Make legal decisions   Operate machinery   Drink alcohol   Return to work  You will receive specific instructions about eating, activities and medications before you leave.    The above instructions have been reviewed and explained to me by    Ulis Rias RN  October 16, 2009 4:51 PM     I fully understand and can verbalize these instructions _____________________________ Date _________

## 2010-05-23 NOTE — Procedures (Signed)
Summary: Colonoscopy  Patient: Heather [name] Note: All result statuses are Final unless otherwise noted.  Tests: (1) Colonoscopy (COL)   COL Colonoscopy           DONE     [facility]     [address]     [place], [place]  [address]           COLONOSCOPY PROCEDURE REPORT           PATIENT:  Heather, [name]  MR#:  [id number]     BIRTHDATE:  [birth year], 49 yrs. old  GENDER:  female     ENDOSCOPIST:  Judie Petit T. Russella Dar, MD, [facility]           PROCEDURE DATE:  10/30/2009     PROCEDURE:  Colonoscopy with snare polypectomy     ASA CLASS:  Class II     INDICATIONS:  1) Routine Risk Screening     MEDICATIONS:   Fentanyl 75 mcg IV, Versed 8 mg IV     DESCRIPTION OF PROCEDURE:   After the risks benefits and     alternatives of the procedure were thoroughly explained, informed     consent was obtained.  Digital rectal exam was performed and     revealed no abnormalities.   The LB [id number]-[id number] endoscope     was introduced through the anus and advanced to the cecum, which     was identified by both the appendix and ileocecal valve, limited     by fair prep, a tortuous colon.    The quality of the prep was     Moviprep fair.  The instrument was then slowly withdrawn as the     colon was fully examined.     <<PROCEDUREIMAGES>>     FINDINGS:  Moderate melanosis coli was found throughout the colon.     A sessile polyp was found in the cecum. It was 5 mm in size. Polyp     was snared, then cauterized with monopolar cautery. Retrieval was     successful. This was otherwise a normal examination of the colon.     Retroflexed views in the rectum revealed no abnormalities. The time     to cecum =  8  minutes. The scope was then withdrawn (time =  11.5     min) from the patient and the procedure completed.           COMPLICATIONS:  None           ENDOSCOPIC IMPRESSION:     1) Melanosis throughout the colon     2) 5 mm sessile polyp in the cecum           RECOMMENDATIONS:     1)  Await pathology results     2) No aspirin or NSAID's for 2 weeks     3) High fiber diet with liberal fluid intake.     4) Repeat Colonoscopy in 5 years due to fair prep and tortuous     colon, pending path review           Zamariah Seaborn T. Russella Dar, MD, [facility]           CC: Lelon Perla, DO           n.     eSIGNED:   Venita Lick. Julianne Chamberlin at 10/30/2009 09:33 AM           Berdine Addison, [id number]  Note: An exclamation mark (!) indicates a result that  was not dispersed into the flowsheet. Document Creation Date: 10/30/2009 9:35 AM _______________________________________________________________________  (1) Order result status: Final Collection or observation date-time: 10/30/2009 09:30 Requested date-time:  Receipt date-time:  Reported date-time:  Referring Physician:   Ordering Physician: Claudette Head [phone]) Specimen Source:  Source: Launa Grill Order Number: [id number] Lab site:   Appended Document: Colonoscopy     Colonoscopy  Procedure date:  10/30/2009  Findings:      Location:  [facility].    Procedures Next Due Date:    Colonoscopy: 10/2014

## 2010-05-23 NOTE — Assessment & Plan Note (Signed)
Summary: SINUS PAIN/RH.....   Vital Signs:  Patient profile:   50 year old female Menstrual status:  hysterectomy Height:      66 inches Weight:      265.0 pounds BMI:     42.93 Temp:     98.1 degrees F oral Pulse rate:   70 / minute Pulse rhythm:   regular BP sitting:   118 / 80  (right arm) Cuff size:   large  Vitals Entered By: Almeta Monas CMA Duncan Dull) (February 18, 2010 10:33 AM) CC: x4 days c/o sinus pain/pressure, teeth pain, congestion, cough, a dn yellow mucus, URI symptoms   History of Present Illness:       This is a 50 year old woman who presents with URI symptoms.  The symptoms began 5 days ago.  Pt was taking advil and zyrtec with flonase and neti pot --- no relief.  The patient complains of nasal congestion, purulent nasal discharge, productive cough, earache, and sick contacts, but denies clear nasal discharge, sore throat, and dry cough.  Associated symptoms include fever.  The patient denies low-grade fever (<100.5 degrees), fever of 100.5-103 degrees, fever of 103.1-104 degrees, fever to >104 degrees, stiff neck, dyspnea, wheezing, rash, vomiting, diarrhea, use of an antipyretic, and response to antipyretic.  The patient also reports sneezing and headache.  The patient denies itchy watery eyes, itchy throat, seasonal symptoms, response to antihistamine, muscle aches, and severe fatigue.  Risk factors for Strep sinusitis include tooth pain.  The patient denies the following risk factors for Strep sinusitis: unilateral facial pain, unilateral nasal discharge, poor response to decongestant, double sickening, Strep exposure, tender adenopathy, and absence of cough.    Current Medications (verified): 1)  Lisinopril 10 Mg  Tabs (Lisinopril) .Marland Kitchen.. 1 By Mouth Once Daily 2)  Lasix 20 Mg  Tabs (Furosemide) .Marland Kitchen.. 1 By Mouth Once Daily 3)  Zocor 40 Mg  Tabs (Simvastatin) .Marland Kitchen.. 1 By Mouth At Bedtime. Needs Labwork. 4)  Combigan 0.2-0.5 % Soln (Brimonidine Tartrate-Timolol) 5)  Cvs Iron  325 (65 Fe) Mg Tabs (Ferrous Sulfate) .... Take One Tab By Mouth Once Daily 6)  Vitamin B-12 1000 Mcg Tabs (Cyanocobalamin) .... Take As Directed 7)  Calcium 500/d 500-200 Mg-Unit Tabs (Calcium Carbonate-Vitamin D) .... Take As Directed 8)  Combigan 0.2-0.5 % Soln (Brimonidine Tartrate-Timolol) .Marland Kitchen.. 1gtt Two Times A Day 9)  Vitamin D 400 Unit Caps (Cholecalciferol) .Marland Kitchen.. 1 By Mouth Once Daily 10)  Vitamin C 500 Mg Tabs (Ascorbic Acid) .Marland Kitchen.. 1 By Mouth Once Daily 11)  Multivitamins  Tabs (Multiple Vitamin) .Marland Kitchen.. 1 By Mouth Once Daily 12)  Alprazolam 0.25 Mg Tabs (Alprazolam) .Marland Kitchen.. 1 By Mouth Three Times A Day As Needed 13)  Flonase 50 Mcg/act Susp (Fluticasone Propionate) .... 2 Sprays Each Nostril Once Daily 14)  Cefdinir 300 Mg Caps (Cefdinir) .... One Cap By Mouth Two Times A Day 10 Days 15)  Tessalon Perles 100 Mg Caps (Benzonatate) .... One Cap By Mouth Three Times A Day 16)  Lovaza 1 Gm Caps (Omega-3-Acid Ethyl Esters) .... 2 By Mouth Two Times A Day 17)  Augmentin 875-125 Mg Tabs (Amoxicillin-Pot Clavulanate) .Marland Kitchen.. 1 By Mouth Two Times A Day  Allergies (verified): 1)  ! * Tamaflu  Past History:  Past medical, surgical, family and social histories (including risk factors) reviewed for relevance to current acute and chronic problems.  Past Medical History: Reviewed history from 08/24/2008 and no changes required. Allergic rhinitis Hyperlipidemia Hypertension Current Problems:  GLAUCOMA (ICD-365.9) LEG PAIN,  LEFT (ICD-729.5) PLEURAL EFFUSION (ICD-511.9) ACUTE BRONCHITIS (ICD-466.0) PNEUMONIA, ORGANISM UNSPECIFIED (ICD-486) EDEMA (ICD-782.3) CHEST PAIN (ICD-786.50) COUGH (ICD-786.2) FAMILY HISTORY DIABETES 1ST DEGREE RELATIVE (ICD-V18.0) HYPERTENSION (ICD-401.9) HYPERLIPIDEMIA (ICD-272.4) ALLERGIC RHINITIS (ICD-477.9)  Past Surgical History: Reviewed history from 08/24/2008 and no changes required. Hysterectomy (1998)--TAH Lumpectomy  Family History: Reviewed history from  08/30/2009 and no changes required. Family History Diabetes 1st degree relative Family History High cholesterol Family History Hypertension Family History of Endometrial cancer Family History of Stroke M 1st degree relative at 51 yo secondary to a fib F--Dementia   Social History: Reviewed history from 08/24/2008 and no changes required. Occupation: Nurse, learning disability lab Never Smoked Divorced Alcohol use-no Drug use-no Regular exercise-yes  Review of Systems      See HPI  Physical Exam  General:  Well-developed,well-nourished,in no acute distress; alert,appropriate and cooperative throughout examination Ears:  External ear exam shows no significant lesions or deformities.  Otoscopic examination reveals clear canals, tympanic membranes are intact bilaterally without bulging, retraction, inflammation or discharge. Hearing is grossly normal bilaterally. Nose:  L frontal sinus tenderness, L maxillary sinus tenderness, R frontal sinus tenderness, and R maxillary sinus tenderness.   Mouth:  Oral mucosa and oropharynx without lesions or exudates.  Teeth in good repair. Neck:  supple.   Lungs:  Normal respiratory effort, chest expands symmetrically. Lungs are clear to auscultation, no crackles or wheezes. Heart:  Normal rate and regular rhythm. S1 and S2 normal without gallop, murmur, click, rub or other extra sounds. Cervical Nodes:  R anterior LN enlarged and L anterior LN enlarged.   Psych:  Cognition and judgment appear intact. Alert and cooperative with normal attention span and concentration. No apparent delusions, illusions, hallucinations   Impression & Recommendations:  Problem # 1:  SINUSITIS - ACUTE-NOS (ICD-461.9)  Her updated medication list for this problem includes:    Veramyst 27.5 Mcg/spray Susp (Fluticasone furoate) .Marland Kitchen... 2 sprays each nostril once daily    Cefdinir 300 Mg Caps (Cefdinir) ..... One cap by mouth two times a day 10 days    Tessalon Perles 100 Mg Caps  (Benzonatate) ..... One cap by mouth three times a day    Augmentin 875-125 Mg Tabs (Amoxicillin-pot clavulanate) .Marland Kitchen... 1 by mouth two times a day  Instructed on treatment. Call if symptoms persist or worsen.   Complete Medication List: 1)  Lisinopril 10 Mg Tabs (Lisinopril) .Marland Kitchen.. 1 by mouth once daily 2)  Lasix 20 Mg Tabs (Furosemide) .Marland Kitchen.. 1 by mouth once daily 3)  Zocor 40 Mg Tabs (Simvastatin) .Marland Kitchen.. 1 by mouth at bedtime. needs labwork. 4)  Combigan 0.2-0.5 % Soln (Brimonidine tartrate-timolol) 5)  Cvs Iron 325 (65 Fe) Mg Tabs (Ferrous sulfate) .... Take one tab by mouth once daily 6)  Vitamin B-12 1000 Mcg Tabs (Cyanocobalamin) .... Take as directed 7)  Calcium 500/d 500-200 Mg-unit Tabs (Calcium carbonate-vitamin d) .... Take as directed 8)  Combigan 0.2-0.5 % Soln (Brimonidine tartrate-timolol) .Marland Kitchen.. 1gtt two times a day 9)  Vitamin D 400 Unit Caps (Cholecalciferol) .Marland Kitchen.. 1 by mouth once daily 10)  Vitamin C 500 Mg Tabs (Ascorbic acid) .Marland Kitchen.. 1 by mouth once daily 11)  Multivitamins Tabs (Multiple vitamin) .Marland Kitchen.. 1 by mouth once daily 12)  Alprazolam 0.25 Mg Tabs (Alprazolam) .Marland Kitchen.. 1 by mouth three times a day as needed 13)  Veramyst 27.5 Mcg/spray Susp (Fluticasone furoate) .... 2 sprays each nostril once daily 14)  Cefdinir 300 Mg Caps (Cefdinir) .... One cap by mouth two times a day 10 days 15)  Tessalon Perles 100 Mg Caps (Benzonatate) .... One cap by mouth three times a day 16)  Lovaza 1 Gm Caps (Omega-3-acid ethyl esters) .... 2 by mouth two times a day 17)  Augmentin 875-125 Mg Tabs (Amoxicillin-pot clavulanate) .Marland Kitchen.. 1 by mouth two times a day Prescriptions: AUGMENTIN 875-125 MG TABS (AMOXICILLIN-POT CLAVULANATE) 1 by mouth two times a day  #20 x 0   Entered and Authorized by:   Loreen Freud DO   Signed by:   Loreen Freud DO on 02/18/2010   Method used:   Electronically to        [facility] [address] (retail)       [phone]       [place], [place]  [address]       Ph: [phone]       Fax: [phone]   RxID:   [id number]    Orders Added: 1)  Est. Patient Level III [24401]

## 2010-05-23 NOTE — Miscellaneous (Signed)
Summary: Lec previsit  Clinical Lists Changes  Medications: Added new medication of MOVIPREP 100 GM  SOLR (PEG-KCL-NACL-NASULF-NA ASC-C) As per prep instructions. - Signed Rx of MOVIPREP 100 GM  SOLR (PEG-KCL-NACL-NASULF-NA ASC-C) As per prep instructions.;  #1 x 0;  Signed;  Entered by: Ulis Rias RN;  Authorized by: Meryl Dare MD [facility];  Method used: [facility] [phone]*, [address] [place], [place]  [address], Ph: [phone] or [phone], Fax: [phone] Observations: Added new observation of ALLERGY REV: Done (10/16/2009 16:20)    Prescriptions: MOVIPREP 100 GM  SOLR (PEG-KCL-NACL-NASULF-NA ASC-C) As per prep instructions.  #1 x 0   Entered by:   Ulis Rias RN   Authorized by:   Meryl Dare MD [facility]   Signed by:   Ulis Rias RN on 10/16/2009   Method used:   [facility]ically to        [facility] [address] [phone]* (retail)       [address]       [place], [place]  [address]       Ph: [phone] or [phone]       Fax: [phone]   RxID:   [id number]

## 2010-05-23 NOTE — Letter (Signed)
Summary: Patient Notice- Colon Biospy Results  [facility]  [address] [place], [place] [address]   Phone: [phone]  Fax: [phone]        October 31, 2009 MRN: [id number]    Heather [name] [address] [place], [place]  [address]    Dear [name],  I am pleased to inform you that the biopsies taken during your recent colonoscopy did not show any evidence of cancer upon pathologic examination. 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 There was no polyp tissue.  Continue with the treatment plan as outlined on the day of your      exam.  You should have a repeat colonoscopy examination in 5 years.  Please call us if you are having persistent problems or have questions about your condition that have not been fully answered at this time.  Sincerely,  Meryl Dare MD [facility]  This letter has been electronically signed by your physician.  Appended Document: Patient Notice- Colon Biospy Results letter mailed.

## 2010-05-23 NOTE — Assessment & Plan Note (Signed)
Summary: cough,neck pain,eye pain//lh   Vital Signs:  Patient profile:   50 year old female Menstrual status:  hysterectomy Height:      66 inches Weight:      284 pounds BMI:     46.00 Temp:     98.4 degrees F oral BP sitting:   124 / 80  (left arm)  Vitals Entered By: Doristine Devoid (May 22, 2009 1:31 PM) CC: dry cough and sinus pressure    Primary Care Provider:  Laury Axon  CC:  dry cough and sinus pressure .  History of Present Illness: Ms Heather [name] isa a 50 year old female who presents with c/o throat irritation, cough, neck soreness, swollen gland, + sinus HA, pressure behind eyes.  Denies fever.  Has tried mucinex D and has been using neti pot without relief.  Notes nasal discharge is clear, cough is sometimes productive of clear sputum.    Allergies: 1)  ! * Tamaflu  Physical Exam  General:  Well-developed,well-nourished,in no acute distress; alert,appropriate and cooperative throughout examination Ears:  L TM will serous effusion Nose:  External nasal examination shows no deformity or inflammation. Nasal mucosa are pink and moist without lesions or exudates. Mouth:  Oral mucosa and oropharynx without lesions or exudates.  Teeth in good repair. Neck:  No deformities, masses, or tenderness noted. Lungs:  Normal respiratory effort, chest expands symmetrically. Lungs are clear to auscultation, no crackles or wheezes. Heart:  Normal rate and regular rhythm. S1 and S2 normal without gallop, murmur, click, rub or other extra sounds.   Impression & Recommendations:  Problem # 1:  SINUSITIS - ACUTE-NOS (ICD-461.9) Assessment New Plan treatment with cefdinir, flonase and tessalon.  Patient made aware of red flags that should prompt return Her updated medication list for this problem includes:    Flonase 50 Mcg/act Susp (Fluticasone propionate) .Marland Kitchen... 2 sprays each nostril once daily    Cefdinir 300 Mg Caps (Cefdinir) ..... One cap by mouth two times a day 10 days    Tessalon  Perles 100 Mg Caps (Benzonatate) ..... One cap by mouth three times a day  Complete Medication List: 1)  Lisinopril 10 Mg Tabs (Lisinopril) .Marland Kitchen.. 1 by mouth once daily 2)  Lasix 20 Mg Tabs (Furosemide) .Marland Kitchen.. 1 by mouth once daily 3)  Zocor 40 Mg Tabs (Simvastatin) .Marland Kitchen.. 1 by mouth at bedtime 4)  Combigan 0.2-0.5 % Soln (Brimonidine tartrate-timolol) 5)  Cvs Iron 325 (65 Fe) Mg Tabs (Ferrous sulfate) .... Take one tab by mouth once daily 6)  Vitamin B-12 1000 Mcg Tabs (Cyanocobalamin) .... Take as directed 7)  Calcium 500/d 500-200 Mg-unit Tabs (Calcium carbonate-vitamin d) .... Take as directed 8)  Combigan 0.2-0.5 % Soln (Brimonidine tartrate-timolol) .Marland Kitchen.. 1gtt two times a day 9)  Vitamin D 400 Unit Caps (Cholecalciferol) .Marland Kitchen.. 1 by mouth once daily 10)  Vitamin C 500 Mg Tabs (Ascorbic acid) .Marland Kitchen.. 1 by mouth once daily 11)  Multivitamins Tabs (Multiple vitamin) .Marland Kitchen.. 1 by mouth once daily 12)  Alprazolam 0.25 Mg Tabs (Alprazolam) .Marland Kitchen.. 1 by mouth three times a day as needed 13)  Flonase 50 Mcg/act Susp (Fluticasone propionate) .... 2 sprays each nostril once daily 14)  Cefdinir 300 Mg Caps (Cefdinir) .... One cap by mouth two times a day 10 days 15)  Tessalon Perles 100 Mg Caps (Benzonatate) .... One cap by mouth three times a day  Patient Instructions: 1)   Call if you develop fever over 101, increasing sinus pressure, pain with eye  movement, increased facial tenderness of swelling, or if you develop visual changes. 2)  Continue Mucinex-D, zyrtec, flonase, and neti pot as needed.   Prescriptions: TESSALON PERLES 100 MG CAPS (BENZONATATE) one cap by mouth three times a day  #30 x 0   Entered and Authorized by:   Lemont Fillers FNP   Signed by:   Lemont Fillers FNP on 05/22/2009   Method used:   [facility]        [facility] [phone][facility] (retail)       [address]       [place], [place]  [address]       Ph: [phone]       Fax: [phone]    RxID:   [id number] CEFDINIR 300 MG CAPS (CEFDINIR) one cap by mouth two times a day 10 days  #20 x 0   Entered and Authorized by:   Lemont Fillers FNP   Signed by:   Lemont Fillers FNP on 05/22/2009   Method used:   [facility]ronically to        [facility] [phone]* (retail)       [address]       [place], [place]  [address]       Ph: [phone]       Fax: [phone]   RxID:   [id number]

## 2010-05-23 NOTE — Medication Information (Signed)
Summary: Approval for Lovaza/[employer]  Approval for Lovaza/[employer]   Imported By: Lanelle Bal 09/07/2009 08:51:22  _____________________________________________________________________  External Attachment:    Type:   Image     Comment:   External Document

## 2010-05-23 NOTE — Letter (Signed)
Summary: Colonoscopy Letter  [facility]  [address] [place], [place] [address]   Phone: [phone]  Fax: [phone]      May 29, 2009 MRN: [id number]   Heather [name] [address] [place], [place]  [address]   Dear [name],   According to your medical record, it is time for you to schedule a Colonoscopy. The American Cancer Society recommends this procedure as a method to detect early colon cancer. Patients with a family history of colon cancer, or a personal history of colon polyps or inflammatory bowel disease are at increased risk.  This letter has beeen generated based on the recommendations made at the time of your procedure. If you feel that in your particular situation this may no longer apply, please contact our office.  Please call our office at [phone] to schedule this appointment or to update your records at your earliest convenience.  Thank you for cooperating with [place] to provide you with the very best care possible.   Sincerely,  Judie Petit T. Russella Dar, M.D.  [facility] [phone]

## 2010-05-23 NOTE — Progress Notes (Signed)
Summary: REFILL  Phone Note Refill Request Message from:  Fax from [facility]acy on [facility] [address] FAX [phone]  SIMVASTATIN 40 MG  Initial call taken by: Barb Merino,  May 16, 2009 12:00 PM    Prescriptions: ZOCOR 40 MG  TABS (SIMVASTATIN) 1 by mouth at bedtime  #30 Tablet x 0   Entered by:   Army Fossa CMA   Authorized by:   Loreen Freud DO   Signed by:   Army Fossa CMA on 05/16/2009   Method used:   Electronically to        [facility] [phone]* (retail)       [address]       [place], [place]  [address]       Ph: [phone]       Fax: [phone]   RxID:   [id number]

## 2010-05-23 NOTE — Assessment & Plan Note (Signed)
Summary: cpx//lch   Vital Signs:  Patient profile:   50 year old female Menstrual status:  hysterectomy Weight:      278 pounds Pulse rate:   86 / minute Pulse rhythm:   regular BP sitting:   126 / 80  (left arm) Cuff size:   large  Vitals Entered By: Army Fossa CMA (Aug 30, 2009 12:51 PM) CC: Pt here for cpx, no pap   History of Present Illness: Pt here for cpe --no labs. Labs were done at work in March.   No complaints except swelling R leg.    Hypertension follow-up      This is a 50 year old woman who presents for Hypertension follow-up.  The patient denies lightheadedness, urinary frequency, headaches, edema, impotence, rash, and fatigue.  The patient denies the following associated symptoms: chest pain, chest pressure, exercise intolerance, dyspnea, palpitations, syncope, leg edema, and pedal edema.  Compliance with medications (by patient report) has been near 100%.  The patient reports that dietary compliance has been good.  The patient reports exercising daily.  Adjunctive measures currently used by the patient include salt restriction.    Hyperlipidemia follow-up      The patient also presents for Hyperlipidemia follow-up.  The patient denies muscle aches, GI upset, abdominal pain, flushing, itching, constipation, diarrhea, and fatigue.  The patient denies the following symptoms: chest pain/pressure, exercise intolerance, dypsnea, palpitations, syncope, and pedal edema.  Compliance with medications (by patient report) has been near 100%.  Dietary compliance has been good.  The patient reports exercising daily.  Adjunctive measures currently used by the patient include ASA, folic acid, fish oil supplements, limiting alcohol consumpton, and weight reduction.    Preventive Screening-Counseling & Management  Alcohol-Tobacco     Alcohol drinks/day: 0     Smoking Status: never     Passive Smoke Exposure: no  Caffeine-Diet-Exercise     Caffeine use/day: 1     Does Patient  Exercise: yes     Type of exercise: treadmill     Exercise (avg: min/session): <30     Times/week: 6  Hep-HIV-STD-Contraception     Dental Visit-last 6 months yes     Dental Care Counseling: not indicated; dental care within six months     SBE monthly: yes     SBE Education/Counseling: to perform regular SBE  Current Medications (verified): 1)  Lisinopril 10 Mg  Tabs (Lisinopril) .Marland Kitchen.. 1 By Mouth Once Daily 2)  Lasix 20 Mg  Tabs (Furosemide) .Marland Kitchen.. 1 By Mouth Once Daily 3)  Zocor 40 Mg  Tabs (Simvastatin) .Marland Kitchen.. 1 By Mouth At Bedtime. Needs Labwork. 4)  Combigan 0.2-0.5 % Soln (Brimonidine Tartrate-Timolol) 5)  Cvs Iron 325 (65 Fe) Mg Tabs (Ferrous Sulfate) .... Take One Tab By Mouth Once Daily 6)  Vitamin B-12 1000 Mcg Tabs (Cyanocobalamin) .... Take As Directed 7)  Calcium 500/d 500-200 Mg-Unit Tabs (Calcium Carbonate-Vitamin D) .... Take As Directed 8)  Combigan 0.2-0.5 % Soln (Brimonidine Tartrate-Timolol) .Marland Kitchen.. 1gtt Two Times A Day 9)  Vitamin D 400 Unit Caps (Cholecalciferol) .Marland Kitchen.. 1 By Mouth Once Daily 10)  Vitamin C 500 Mg Tabs (Ascorbic Acid) .Marland Kitchen.. 1 By Mouth Once Daily 11)  Multivitamins  Tabs (Multiple Vitamin) .Marland Kitchen.. 1 By Mouth Once Daily 12)  Alprazolam 0.25 Mg Tabs (Alprazolam) .Marland Kitchen.. 1 By Mouth Three Times A Day As Needed 13)  Flonase 50 Mcg/act Susp (Fluticasone Propionate) .... 2 Sprays Each Nostril Once Daily 14)  Cefdinir 300 Mg Caps (Cefdinir) .... One  Cap By Mouth Two Times A Day 10 Days 15)  Tessalon Perles 100 Mg Caps (Benzonatate) .... One Cap By Mouth Three Times A Day 16)  Lovaza 1 Gm Caps (Omega-3-Acid Ethyl Esters) .... 2 By Mouth Two Times A Day  Allergies: 1)  ! * Tamaflu  Past History:  Past Medical History: Last updated: 08/24/2008 Allergic rhinitis Hyperlipidemia Hypertension Current Problems:  GLAUCOMA (ICD-365.9) LEG PAIN, LEFT (ICD-729.5) PLEURAL EFFUSION (ICD-511.9) ACUTE BRONCHITIS (ICD-466.0) PNEUMONIA, ORGANISM UNSPECIFIED (ICD-486) EDEMA  (ICD-782.3) CHEST PAIN (ICD-786.50) COUGH (ICD-786.2) FAMILY HISTORY DIABETES 1ST DEGREE RELATIVE (ICD-V18.0) HYPERTENSION (ICD-401.9) HYPERLIPIDEMIA (ICD-272.4) ALLERGIC RHINITIS (ICD-477.9)  Past Surgical History: Last updated: 08/24/2008 Hysterectomy (1998)--TAH Lumpectomy  Family History: Last updated: 08/30/2009 Family History Diabetes 1st degree relative Family History High cholesterol Family History Hypertension Family History of Endometrial cancer Family History of Stroke M 1st degree relative at 51 yo secondary to a fib F--Dementia   Social History: Last updated: 08/24/2008 Occupation: Production designer, theatre/television/film 24h lab Never Smoked Divorced Alcohol use-no Drug use-no Regular exercise-yes  Risk Factors: Alcohol Use: 0 (08/30/2009) Caffeine Use: 1 (08/30/2009) Exercise: yes (08/30/2009)  Risk Factors: Smoking Status: never (08/30/2009) Passive Smoke Exposure: no (08/30/2009)  Family History: Reviewed history from 08/05/2006 and no changes required. Family History Diabetes 1st degree relative Family History High cholesterol Family History Hypertension Family History of Endometrial cancer Family History of Stroke M 1st degree relative at 51 yo secondary to a fib F--Dementia   Social History: Reviewed history from 08/24/2008 and no changes required. 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 Eyes:  Denies blurring, discharge, double vision, eye irritation, eye pain, halos, itching, light sensitivity, red eye, vision loss-1 eye, and vision loss-both eyes; optho--- Z6X . ENT:  Denies decreased hearing, difficulty swallowing, ear discharge, earache, hoarseness, nasal congestion, nosebleeds, postnasal drainage, ringing in ears, sinus pressure, and sore throat. CV:   Denies bluish discoloration of lips or nails, chest pain or discomfort, difficulty breathing at night, difficulty breathing while lying down, fainting, fatigue, leg cramps with exertion, lightheadness, near fainting, palpitations, shortness of breath with exertion, swelling of feet, swelling of hands, and weight gain. Resp:  Denies chest discomfort, chest pain with inspiration, cough, coughing up blood, excessive snoring, hypersomnolence, morning headaches, pleuritic, shortness of breath, sputum productive, and wheezing. GI:  Denies abdominal pain, bloody stools, change in bowel habits, constipation, dark tarry stools, diarrhea, excessive appetite, gas, hemorrhoids, indigestion, loss of appetite, and nausea. GU:  Denies abnormal vaginal bleeding, decreased libido, discharge, dysuria, genital sores, hematuria, incontinence, nocturia, urinary frequency, and urinary hesitancy. MS:  Complains of joint pain; denies joint redness, joint swelling, loss of strength, low back pain, mid back pain, muscle aches, muscle , cramps, muscle weakness, stiffness, and thoracic pain. Derm:  Denies changes in color of skin, changes in nail beds, dryness, excessive perspiration, flushing, hair loss, insect bite(s), itching, lesion(s), poor wound healing, and rash. Neuro:  Denies brief paralysis, difficulty with concentration, disturbances in coordination, falling down, headaches, inability to speak, memory loss, numbness, poor balance, seizures, sensation of room spinning, tingling, tremors, visual disturbances, and weakness. Psych:  Denies alternate hallucination ( auditory/visual), anxiety, depression, easily angered, easily tearful, irritability, mental problems, panic attacks, sense of great danger, suicidal thoughts/plans, thoughts of violence, unusual visions or sounds, and thoughts /plans of harming others. Endo:  Denies  cold intolerance, excessive hunger, excessive thirst, excessive urination, heat intolerance, polyuria,  and weight change. Heme:  Denies abnormal bruising, bleeding, enlarge lymph nodes, fevers, pallor, and skin discoloration. Allergy:  Denies hives or rash, itching eyes, persistent infections, seasonal allergies, and sneezing.  Physical Exam  General:  Well-developed,well-nourished,in no acute distress; alert,appropriate and cooperative throughout examinationoverweight-appearing.   Head:  Normocephalic and atraumatic without obvious abnormalities. No apparent alopecia or balding. Eyes:  vision grossly intact, pupils equal, pupils round, pupils reactive to light, and no injection.   Ears:  External ear exam shows no significant lesions or deformities.  Otoscopic examination reveals clear canals, tympanic membranes are intact bilaterally without bulging, retraction, inflammation or discharge. Hearing is grossly normal bilaterally. Nose:  External nasal examination shows no deformity or inflammation. Nasal mucosa are pink and moist without lesions or exudates. Mouth:  Oral mucosa and oropharynx without lesions or exudates.  Teeth in good repair. Neck:  No deformities, masses, or tenderness noted. Chest Wall:  No deformities, masses, or tenderness noted. Breasts:  No mass, nodules, thickening, tenderness, bulging, retraction, inflamation, nipple discharge or skin changes noted.   Lungs:  Normal respiratory effort, chest expands symmetrically. Lungs are clear to auscultation, no crackles or wheezes. Heart:  normal rate and no murmur.   Abdomen:  Bowel sounds positive,abdomen soft and non-tender without masses, organomegaly or hernias noted. Msk:  normal ROM, no joint tenderness, no joint swelling, no joint warmth, no redness over joints, no joint deformities, no joint instability, and no crepitation.   Pulses:  R posterior tibial normal, R dorsalis pedis normal, R carotid normal, L posterior tibial normal, L dorsalis pedis normal, and L carotid normal.   Extremities:  left pretibial edema and right  pretibial edema.   Neurologic:  alert & oriented X3 and gait normal.   Skin:  Intact without suspicious lesions or rashes Cervical Nodes:  No lymphadenopathy noted Axillary Nodes:  No palpable lymphadenopathy Psych:  Oriented X3 and normally interactive.     Impression & Recommendations:  Problem # 1:  PREVENTIVE HEALTH CARE (ICD-V70.0)  labs from work reviewed ghm utd   Orders: EKG w/ Interpretation (93000) UA Dipstick w/o Micro (manual) (13086)  Problem # 2:  HYPERGLYCEMIA, FASTING (ICD-790.29)  recheck labs 3 months watch simple sugars---con't diet and exercise Labs Reviewed: Creat: 0.8 (02/27/2009)     Orders: EKG w/ Interpretation (93000) UA Dipstick w/o Micro (manual) (57846)  Problem # 3:  POSTMENOPAUSAL STATUS (ICD-V49.81)  Orders: EKG w/ Interpretation (93000) UA Dipstick w/o Micro (manual) (96295)  Problem # 4:  HYPERTENSION (ICD-401.9)  Her updated medication list for this problem includes:    Lisinopril 10 Mg Tabs (Lisinopril) .Marland Kitchen... 1 by mouth once daily    Lasix 20 Mg Tabs (Furosemide) .Marland Kitchen... 1 by mouth once daily  BP today: 126/80 Prior BP: 124/80 (05/22/2009)  Labs Reviewed: K+: 4.3 (02/27/2009) Creat: : 0.8 (02/27/2009)   Chol: 144 (01/18/2008)   HDL: 46.1 (01/18/2008)   LDL: 82 (01/18/2008)   TG: 81 (01/18/2008)  Orders: EKG w/ Interpretation (93000) UA Dipstick w/o Micro (manual) (28413)  Problem # 5:  HYPERLIPIDEMIA (ICD-272.4)  Her updated medication list for this problem includes:    Zocor 40 Mg Tabs (Simvastatin) .Marland Kitchen... 1 by mouth at bedtime. needs labwork.    Lovaza 1 Gm Caps (Omega-3-acid ethyl esters) .Marland Kitchen... 2 by mouth two times a day  Labs Reviewed: SGOT: 40 (01/18/2008)   SGPT: 39 (01/18/2008)   HDL:46.1 (01/18/2008)  LDL:82 (01/18/2008)  Chol:144 (01/18/2008)  Trig:81 (01/18/2008)  Orders: EKG w/ Interpretation (93000) UA Dipstick w/o Micro (manual) (14782)  Complete Medication List: 1)  Lisinopril 10 Mg Tabs (Lisinopril)  .Marland Kitchen.. 1 by mouth once daily 2)  Lasix 20 Mg Tabs (Furosemide) .Marland Kitchen.. 1 by mouth once daily 3)  Zocor 40 Mg Tabs (Simvastatin) .Marland Kitchen.. 1 by mouth at bedtime. needs labwork. 4)  Combigan 0.2-0.5 % Soln (Brimonidine tartrate-timolol) 5)  Cvs Iron 325 (65 Fe) Mg Tabs (Ferrous sulfate) .... Take one tab by mouth once daily 6)  Vitamin B-12 1000 Mcg Tabs (Cyanocobalamin) .... Take as directed 7)  Calcium 500/d 500-200 Mg-unit Tabs (Calcium carbonate-vitamin d) .... Take as directed 8)  Combigan 0.2-0.5 % Soln (Brimonidine tartrate-timolol) .Marland Kitchen.. 1gtt two times a day 9)  Vitamin D 400 Unit Caps (Cholecalciferol) .Marland Kitchen.. 1 by mouth once daily 10)  Vitamin C 500 Mg Tabs (Ascorbic acid) .Marland Kitchen.. 1 by mouth once daily 11)  Multivitamins Tabs (Multiple vitamin) .Marland Kitchen.. 1 by mouth once daily 12)  Alprazolam 0.25 Mg Tabs (Alprazolam) .Marland Kitchen.. 1 by mouth three times a day as needed 13)  Flonase 50 Mcg/act Susp (Fluticasone propionate) .... 2 sprays each nostril once daily 14)  Cefdinir 300 Mg Caps (Cefdinir) .... One cap by mouth two times a day 10 days 15)  Tessalon Perles 100 Mg Caps (Benzonatate) .... One cap by mouth three times a day 16)  Lovaza 1 Gm Caps (Omega-3-acid ethyl esters) .... 2 by mouth two times a day  Patient Instructions: 1)  Please return for a FASTING Lipid Profile in 3 months with hep 272.4 790.6  bmp, [id number] Prescriptions: ZOCOR 40 MG  TABS (SIMVASTATIN) 1 by mouth at bedtime. NEEDS LABWORK.  #30 Tablet x 5   Entered and Authorized by:   Loreen Freud DO   Signed by:   Loreen Freud DO on 08/30/2009   Method used:   Electronically to        [facility] [address] [phone]* (retail)       [address]       [place], [place]  [address]       Ph: [phone] or [phone]       Fax: [phone]   RxID:   [id number] LOVAZA 1 GM CAPS (OMEGA-3-ACID ETHYL ESTERS) 2 by mouth two times a day  #120 x 5   Entered and Authorized by:   Loreen Freud DO   Signed by:   Loreen Freud  DO on 08/30/2009   Method used:   [facility]        [facility] [address] [phone]* (retail)       [address]       [place], [place]  [address]       Ph: [phone] or [phone]       Fax: [phone]   RxID:   [id number]    EKG  Procedure date:  08/30/2009  Findings:      Normal sinus rhythm with rate of:  60  Flu Vaccine Result Date:  01/31/2009 Flu Vaccine Result:  given Flu Vaccine Next Due:  1 yr Mammogram Result Date:  05/02/2009 Mammogram Result:  normal--cyst Mammogram Next Due:  1 yr

## 2010-05-29 NOTE — Progress Notes (Signed)
Summary: REFILL  Phone Note Refill Request Call back at [phone] Message from:  Pharmacy on May 21, 2010 1:45 PM  Refills Requested: Medication #1:  LOVAZA 1 GM CAPS 2 by mouth two times a day.   Dosage confirmed as above?Dosage Confirmed   Supply Requested: 1 month  Medication #2:  ZOCOR 40 MG  TABS 1 by mouth at bedtime. NEEDS LABWORK IN FEB.   Dosage confirmed as above?Dosage Confirmed   Supply Requested: 1 month [facility] [address]  Next Appointment Scheduled: NONE Initial call taken by: Lavell Islam,  May 21, 2010 1:46 PM    Prescriptions: LOVAZA 1 GM CAPS (OMEGA-3-ACID ETHYL ESTERS) 2 by mouth two times a day  #60 x 0   Entered by:   Almeta Monas CMA (AAMA)   Authorized by:   Loreen Freud DO   Signed by:   Almeta Monas CMA (AAMA) on 05/21/2010   Method used:   Faxed to ...       [facility] [address] (retail)       [phone]       [place], [place]  [address]       Ph: [phone]       Fax: [phone]   RxID:   [id number] ZOCOR 40 MG  TABS (SIMVASTATIN) 1 by mouth at bedtime. NEEDS LABWORK IN FEB.  #30 x 0   Entered by:   Almeta Monas CMA (AAMA)   Authorized by:   Loreen Freud DO   Signed by:   Almeta Monas CMA (AAMA) on 05/21/2010   Method used:   Faxed to ...       [facility] [address] (retail)       [phone]       [place], [place]  [address]       Ph: [phone]       Fax: [phone]   RxID:   [id number]

## 2010-06-20 ENCOUNTER — Encounter: Payer: Self-pay | Admitting: Family Medicine

## 2010-06-20 LAB — HM MAMMOGRAPHY

## 2010-07-22 ENCOUNTER — Telehealth: Payer: Self-pay

## 2010-07-22 NOTE — Telephone Encounter (Signed)
Rcv'd labs and they were WNL but patient stated she had concerns about a clot and will wait until physical exam..per orders Dr. Laury Axon if patient thinks she has a clot she will need to be seen now.... I left a message for the patient to call back.      KP

## 2010-07-23 NOTE — Telephone Encounter (Signed)
Pt aware coming in for OV tomorrow, advise of warning and ED if symptoms worsen, Pt ok.

## 2010-07-23 NOTE — Telephone Encounter (Signed)
Pt return call,Left message to call office.  

## 2010-07-24 ENCOUNTER — Encounter: Payer: Self-pay | Admitting: Family Medicine

## 2010-07-24 ENCOUNTER — Ambulatory Visit (INDEPENDENT_AMBULATORY_CARE_PROVIDER_SITE_OTHER): Payer: 59 | Admitting: Family Medicine

## 2010-07-24 DIAGNOSIS — M79609 Pain in unspecified limb: Secondary | ICD-10-CM

## 2010-07-24 DIAGNOSIS — M79646 Pain in unspecified finger(s): Secondary | ICD-10-CM

## 2010-07-24 NOTE — Progress Notes (Signed)
  Subjective:    Patient ID: Heather [name], female    DOB: 10-08-1960, 50 y.o.   MRN: [id number]  HPI Leg pain- R leg both medial thigh and lateral calf.  Will sit for long periods of time in the same positions.  Has hx of elevated Ddimer but (-) Dopplers.  Pain is intermittant.  sxs started >1 yr ago.  No redness or swelling.  + family hx of vericose veins.  Lower leg has been more bothersome.  R thumb pain- has lump on knuckle.  Only painful when pressed.  First noticed 'months' ago.   Review of Systems For ROS see HPI     Objective:   Physical Exam  Constitutional: She appears well-developed and well-nourished. No distress.  Musculoskeletal:       Firm, mobile, calcified area on R thumb IP joint  Tenderness of R lateral calf w/out edema or erythema.  Evidence of venous insufficiency/vericosities  No tenderness, swelling, erythema, or mass on R medial thigh          Assessment & Plan:

## 2010-07-24 NOTE — Patient Instructions (Signed)
We will call you with your vascular appt Next time you develop leg pain or throbbing- try ibuprofen for pain relief Elevate your legs as much as possible Your thumb appears to be a calcified cyst- you can see a hand specialist, have an ultrasound or get an XRay to assess.  Think about it and talk w/ Dr Laury Axon. Call with any questions or concerns Hang in there!!

## 2010-07-26 ENCOUNTER — Encounter: Payer: Self-pay | Admitting: Family Medicine

## 2010-07-29 ENCOUNTER — Encounter: Payer: Self-pay | Admitting: Family Medicine

## 2010-07-30 NOTE — Assessment & Plan Note (Signed)
Area only tender when pushed.  Consistent w/ calcified cyst.  Offered pt multiple diagnostic options, including watchful waiting.  She is to think about these and talk w/ Dr Laury Axon at her CPE.

## 2010-07-30 NOTE — Assessment & Plan Note (Signed)
Pt's leg pain is most likely related to vericose veins.  Reviewed importance of elevation, compression.  Refer to vascular for evaluation and tx.  Pt's hx and sxs not consistent w/ DVT.

## 2010-08-02 ENCOUNTER — Ambulatory Visit (INDEPENDENT_AMBULATORY_CARE_PROVIDER_SITE_OTHER): Payer: 59 | Admitting: Family Medicine

## 2010-08-02 ENCOUNTER — Encounter: Payer: Self-pay | Admitting: Family Medicine

## 2010-08-02 VITALS — BP 114/62 | HR 60 | Ht 66.0 in | Wt 260.2 lb

## 2010-08-02 DIAGNOSIS — E785 Hyperlipidemia, unspecified: Secondary | ICD-10-CM

## 2010-08-02 DIAGNOSIS — Z0000 Encounter for general adult medical examination without abnormal findings: Secondary | ICD-10-CM

## 2010-08-02 DIAGNOSIS — H409 Unspecified glaucoma: Secondary | ICD-10-CM

## 2010-08-02 DIAGNOSIS — I10 Essential (primary) hypertension: Secondary | ICD-10-CM

## 2010-08-02 MED ORDER — OMEGA-3-ACID ETHYL ESTERS 1 G PO CAPS: 2.0000 g | ORAL_CAPSULE | Freq: Two times a day (BID) | ORAL | Status: DC

## 2010-08-02 MED ORDER — FUROSEMIDE 20 MG PO TABS: 20.0000 mg | ORAL_TABLET | Freq: Every day | ORAL | Status: DC

## 2010-08-02 MED ORDER — SIMVASTATIN 40 MG PO TABS: 40.0000 mg | ORAL_TABLET | Freq: Every day | ORAL | Status: DC

## 2010-08-02 MED ORDER — LISINOPRIL 10 MG PO TABS: 10.0000 mg | ORAL_TABLET | Freq: Every day | ORAL | Status: DC

## 2010-08-02 NOTE — Assessment & Plan Note (Signed)
Cont meds   

## 2010-08-02 NOTE — Progress Notes (Signed)
  Subjective:     Heather [name] is a 50 y.o. female and is here for a comprehensive physical exam. The patient reports problems - L low back / flank pain for 2 days now gone.  History   Social History  . Marital Status: Single    Spouse Name: N/A    Number of Children: N/A  . Years of Education: 20   Occupational History  . [employer]--manage lab    Social History Main Topics  . Smoking status: Never Smoker   . Smokeless tobacco: Never Used  . Alcohol Use: No  . Drug Use: No  . Sexually Active: Yes   Other Topics Concern  . Not on file   Social History Narrative  . No narrative on file   Health Maintenance  Topic Date Due  . Pap Smear  07/08/1977  . Mammogram  07/08/2009  . Influenza Vaccine  01/20/2011  . Tetanus/tdap  07/25/2018  . Colonoscopy  10/31/2019    The following portions of the patient's history were reviewed and updated as appropriate: allergies, current medications, past family history, past medical history, past social history, past surgical history and problem list.  Review of SystemsConstitutional: negative for anorexia, chills, fatigue, fevers, malaise, night sweats, sweats and weight loss Eyes: negative for cataracts, color blindness, contacts/glasses, glaucoma, icterus, irritation and visual disturbance Ears, nose, mouth, throat, and face: negative for ear drainage, earaches, hearing loss, hoarseness, nasal congestion and snoring Respiratory: negative for chronic bronchitis, cough and dyspnea on exertion Cardiovascular: negative for chest pain, chest pressure/discomfort, dyspnea and fatigue Gastrointestinal: negative for abdominal pain, constipation, diarrhea, dyspepsia, dysphagia and nausea Genitourinary:negative for hot flashes and vaginal discharge Integument/breast: negative for breast lump, breast tenderness, changed mole, nipple discharge, pruritus, rash, skin color change and skin lesion(s) Hematologic/lymphatic: negative for  bleeding, easy bruising, lymphadenopathy and petechiae Musculoskeletal:negative for arthralgias, muscle weakness and myalgias Neurological: negative for coordination problems, dizziness, gait problems, headaches, memory problems and tremors Behavioral/Psych: negative for abusive relationship, aggressive behavior, anxiety, depression and fatigue Endocrine: negative for diabetic symptoms including blurry vision, polydipsia, polyphagia and polyuria Allergic/Immunologic: negative for hay fever and urticaria . Objective:    BP 114/62  Pulse 60  Ht 5\' 6"  (1.676 m)  Wt 260 lb 3.2 oz (118.026 kg)  BMI 42.00 kg/m2 General appearance: alert, cooperative, appears stated age and no distress Head: Normocephalic, without obvious abnormality, atraumatic Eyes: conjunctivae/corneas clear. PERRL, EOM's intact. Fundi benign. Ears: normal TM's and external ear canals both ears Nose: Nares normal. Septum midline. Mucosa normal. No drainage or sinus tenderness. Throat: lips, mucosa, and tongue normal; teeth and gums normal Neck: no adenopathy, no carotid bruit, no JVD, supple, symmetrical, trachea midline and thyroid not enlarged, symmetric, no tenderness/mass/nodules Lungs: clear to auscultation bilaterally Breasts: normal appearance, no masses or tenderness Heart: regular rate and rhythm, S1, S2 normal, no murmur, click, rub or gallop Abdomen: soft, non-tender; bowel sounds normal; no masses,  no organomegaly Extremities: extremities normal, atraumatic, no cyanosis or edema Pulses: 2+ and symmetric Skin: Skin color, texture, turgor normal. No rashes or lesions Lymph nodes: Cervical, supraclavicular, and axillary nodes normal. Neurologic: Grossly normal    Assessment:    Healthy female exam.      Plan:     See After Visit Summary for Counseling Recommendations

## 2010-08-02 NOTE — Assessment & Plan Note (Signed)
Sees optho regularly

## 2010-08-02 NOTE — Assessment & Plan Note (Signed)
Labs reviewed.

## 2010-08-20 ENCOUNTER — Encounter (INDEPENDENT_AMBULATORY_CARE_PROVIDER_SITE_OTHER): Payer: 59

## 2010-08-20 ENCOUNTER — Encounter (INDEPENDENT_AMBULATORY_CARE_PROVIDER_SITE_OTHER): Payer: 59 | Admitting: Vascular Surgery

## 2010-08-20 DIAGNOSIS — I83893 Varicose veins of bilateral lower extremities with other complications: Secondary | ICD-10-CM

## 2010-08-20 DIAGNOSIS — M79609 Pain in unspecified limb: Secondary | ICD-10-CM

## 2010-08-21 NOTE — Consult Note (Signed)
NEW PATIENT CONSULTATION  [name], [name] DOB:  [birth year]                                       08/20/2010 ZOXWR#:[id number]  Patient presents today for evaluation of lower extremity pain and swelling and also a history of ulcerations that have healed on her right lateral calf.  She is an otherwise healthy 50 year old female with pain in her right calf and thigh, more so on the right than the left.  She does have swelling bilaterally and a history of healed ulceration over her right lateral calf.  She had an episode in 2010 with suspicion of DVT.  Had a formal duplex at that time which was negative.  I do have that for evaluation.  She underwent repeat venous duplex today in our office which I have also reviewed.  Past history is significant for hypertension, elevated cholesterol and past surgical history for C-section and hysterectomy.  SOCIAL HISTORY:  She is single with 2 children.  She works as a Personal assistant.  She does not smoke or drink alcohol.  FAMILY HISTORY:  Negative for premature atherosclerotic disease.  Does have a history of PE in her family member.  REVIEW OF SYSTEMS:  Positive for weight gain up to 250 pounds.  She is 5 feet 6 inches tall. VASCULAR:  Pain in her legs with walking and lying flat. CARDIAC:  Shortness of breath with exertion. NEUROLOGIC:  Dizziness and headache. PULMONARY:  Bronchitis. URINARY:  Frequent urination. MUSCULOSKELETAL:  Joint pain and arthritis. Review of systems otherwise negative.  PHYSICAL EXAMINATION:  A well-developed, obese black female in no acute distress.  Blood pressure is 146/82, pulse 72, respirations 16.  HEENT: Normal.  Her radial and dorsalis pedis pulses are 2+ bilaterally. Musculoskeletal shows no major deformity or cyanosis.  Neurologic:  No focal weakness or paresthesias.  Skin without ulcers or rashes.  She does have some brown discoloration over several areas over her right lateral calf  where she had healed ulcers.  She underwent noninvasive venous study in our office, and this reveals no evidence of DVT, no evidence of reflux in her superficial or deep system.  I discussed this at length with patient.  I explained that I did not see any evidence of arterial or venous pathology to explain her symptoms.  I explained the importance of elevation and the potential use of compression garments.  She has tried these in the past with mild relief. I explained that she does not have any absolute indication for compression but occasionally these pain complexes are improved with compression.  She will try all this to see.  We will see her again on an as-needed basis.    Larina Earthly, M.D. Electronically Signed  TFE/MEDQ  D:  08/20/2010  T:  08/21/2010  Job:  [id number]  cc:   Lelon Perla, DO

## 2010-08-22 NOTE — Procedures (Unsigned)
DUPLEX DEEP VENOUS EXAM - LOWER EXTREMITY  INDICATION:  Right lower extremity pain.  HISTORY:  Edema:  No. Trauma/Surgery:  No. Pain:  Lateral leg pain for 1 year. PE:  No. Previous DVT:  No. Anticoagulants:  No. Other:  DUPLEX EXAM:               CFV   SFV   PopV  PTV    GSV               R  L  R  L  R  L  R   L  R  L Thrombosis    o  o  o     o     o      o Spontaneous   +  +  +     +     +      + Phasic        +  +  +     +     +      + Augmentation  +  +  +     +     +      + Compressible  +  +  +     +     +      + Competent     +  +  +     +     +      +  Legend:  + - yes  o - no  p - partial  D - decreased  IMPRESSION: 1. No evidence of right lower extremity deep venous thrombosis. 2. No evidence of deep or superficial insufficiency.   _____________________________ Larina Earthly, M.D.  EM/MEDQ  D:  08/20/2010  T:  08/20/2010  Job:  [id number]

## 2010-09-06 NOTE — Assessment & Plan Note (Signed)
[facility]                            CARDIOLOGY OFFICE NOTE   NAME:Jaffer, NELI FOFANA                   MRN:          [id number]  DATE:08/13/2006                            DOB:          1960/12/05    I was asked by Dr. Laury Axon to evaluate Berdine Addison, a delightful 50-  year-old African-American female for discomfort in her chest and a  sensation in the left arm.   The discomfort is described as a slight pressure, ache in the mid  sternum with numbness and tingling, and a funny feeling in the left  arm.  This occurs spontaneously and out of the blue.  It does not occur  with exertion.   She has a lot of stress in her life, including twin 56 year old sons,  both of whom are going off to college this year, to very good schools I  will add.  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  She is under a lot of stress  there.   Her cardiac risk factors are significant for obesity, sedentary  lifestyle, hyperlipidemia on Vytorin, and hypertension.  She has never  smoked.  She does not have a premature history in the immediate family  of coronary artery disease.   PAST MEDICAL HISTORY:  She is intolerant of TAMIFLU and TRAVATAN.  She  does not have a dye allergy.  She does not smoke, drink, or use illicit  drugs.   CURRENT MEDICATIONS:  1. Vytorin 10/20 nightly.  2. Lisinopril hydrochlorothiazide 20/25 daily.  3. Combivent.   She has had a hysterectomy in 1998.   SOCIAL HISTORY:  She is divorced and has these 51 year old twins.  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   REVIEW OF SYSTEMS:  Other than allergies, hay fever, and chronic  fatigue, negative times all systems reviewed.   Of note, she had a 2D echocardiogram October 30, 2005, which was  essentially normal and no evidence of LVH.  In addition, she had an  adenosine Myoview October 04, 2002 for chest discomfort, which  showed an EF  of 60% with normal perfusion.   EXAM:  Blood pressure is 131/76, pulse 74 and regular.  She weighs 266  pounds.  She is very pleasant.  HEENT:  Normocephalic, atraumatic.  PERRLA.  Extraocular movements  intact.  Sclerae clear.  Facial symmetry is normal.  Carotid upstrokes are equal bilaterally without bruits.  There is no  JVD.  NECK:  Supple.  Thyroid is not enlarged.  LUNGS:  Clear.  HEART:  Reveals a nondisplaced PMI.  She has normal S1, S2 without  murmur, rub, or gallop.  ABDOMEN:  Protuberant with good bowel sounds.  Organomegaly could not be  assessed.  EXTREMITIES:  No edema.  Pulses are present.  NEURO:  Intact.   Electrocardiogram was unremarkable at Dr. Ernst Spell office.   ASSESSMENT:  1. Atypical chest pain, which is non-cardiac.  2. Cardiac risk factors of hypertension, hyperlipidemia, and sedentary      lifestyle, as well as obesity.  RECOMMENDATIONS:  1. Reassurance.  2. Weight loss.  3. Walking 3 hours a week.  This was particularly emphasized, as it      will neutralize some of the risks.   We will see her back on a p.r.n. basis.     Thomas C. Daleen Squibb, MD, [facility]  Electronically Signed    TCW/MedQ  DD: 08/13/2006  DT: 08/13/2006  Job #: [id number]   cc:   Lelon Perla, DO

## 2010-10-14 ENCOUNTER — Other Ambulatory Visit: Payer: Self-pay | Admitting: Family Medicine

## 2011-05-09 ENCOUNTER — Encounter: Payer: Self-pay | Admitting: Family Medicine

## 2011-05-09 ENCOUNTER — Ambulatory Visit (INDEPENDENT_AMBULATORY_CARE_PROVIDER_SITE_OTHER): Payer: 59 | Admitting: Family Medicine

## 2011-05-09 ENCOUNTER — Ambulatory Visit: Payer: 59 | Admitting: Family Medicine

## 2011-05-09 VITALS — BP 122/74 | HR 71 | Temp 97.7°F | Wt 269.4 lb

## 2011-05-09 DIAGNOSIS — S0990XA Unspecified injury of head, initial encounter: Secondary | ICD-10-CM

## 2011-05-09 NOTE — Progress Notes (Signed)
  Subjective:     Heather [name] is a 51 y.o. female who presents for evaluation of dizziness. The symptoms started 1 month ago and are ongoing. The attacks occur daily and last 30 minutes. Positions that worsen symptoms: any motion. Previous workup/treatments: none. Associated ear symptoms: none. Associated CNS symptoms: none. Recent infections: none. Head trauma: 38month ago pt fell at church at hit head on floor--carpeted concrete slab. Drug ingestion: none. Noise exposure: no occupational exposure. Family history: non-contributory.  The following portions of the patient's history were reviewed and updated as appropriate: allergies, current medications, past family history, past medical history, past social history, past surgical history and problem list.  Review of Systems Pertinent items are noted in HPI.    Objective:    BP 122/74  Pulse 71  Temp(Src) 97.7 F (36.5 C) (Oral)  Wt 269 lb 6.4 oz (122.199 kg)  SpO2 98% General appearance: alert, cooperative, appears stated age and no distress Eyes: conjunctivae/corneas clear. PERRL, EOM's intact. Fundi benign. Ears: normal TM's and external ear canals both ears Nose: Nares normal. Septum midline. Mucosa normal. No drainage or sinus tenderness. Throat: lips, mucosa, and tongue normal; teeth and gums normal Neck: no adenopathy, no carotid bruit, no JVD, supple, symmetrical, trachea midline and thyroid not enlarged, symmetric, no tenderness/mass/nodules Lungs: clear to auscultation bilaterally Extremities: extremities normal, atraumatic, no cyanosis or edema Lymph nodes: Cervical, supraclavicular, and axillary nodes normal. Neurologic: Alert and oriented X 3, normal strength and tone. Normal symmetric reflexes. Normal coordination and gait      Assessment:    head trauma    Plan:    MRI head. Educational materials given and questions answered. Follow up in several weeks.

## 2011-05-09 NOTE — Patient Instructions (Signed)
Head Injury, Adult You have had a head injury that does not appear serious at this time. A concussion is a state of changed mental ability, usually from a blow to the head. You should take clear liquids for the rest of the day and then resume your regular diet. You should not take sedatives or alcoholic beverages for as long as directed by your caregiver after discharge. After injuries such as yours, most problems occur within the first 24 hours. SYMPTOMS These minor symptoms may be experienced after discharge:  Memory difficulties.   Dizziness.   Headaches.   Double vision.   Hearing difficulties.   Depression.   Tiredness.   Weakness.   Difficulty with concentration.  If you experience any of these problems, you should not be alarmed. A concussion requires a few days for recovery. Many patients with head injuries frequently experience such symptoms. Usually, these problems disappear without medical care. If symptoms last for more than one day, notify your caregiver. See your caregiver sooner if symptoms are becoming worse rather than better. HOME CARE INSTRUCTIONS   During the next 24 hours you must stay with someone who can watch you for the warning signs listed below.  Although it is unlikely that serious side effects will occur, you should be aware of signs and symptoms which may necessitate your return to this location. Side effects may occur up to 7 - 10 days following the injury. It is important for you to carefully monitor your condition and contact your caregiver or seek immediate medical attention if there is a change in your condition. SEEK IMMEDIATE MEDICAL CARE IF:   There is confusion or drowsiness.   You can not awaken the injured person.   There is nausea (feeling sick to your stomach) or continued, forceful vomiting.   You notice dizziness or unsteadiness which is getting worse, or inability to walk.   You have convulsions or unconsciousness.   You experience  severe, persistent headaches not relieved by over-the-counter or prescription medicines for pain. (Do not take aspirin as this impairs clotting abilities). Take other pain medications only as directed.   You can not use arms or legs normally.   There is clear or bloody discharge from the nose or ears.  MAKE SURE YOU:   Understand these instructions.   Will watch your condition.   Will get help right away if you are not doing well or get worse.  Document Released: 04/07/2005 Document Revised: 12/18/2010 Document Reviewed: 02/23/2009 Exit[employer] Patient Information 2012 [employer]. 

## 2011-05-10 ENCOUNTER — Ambulatory Visit (HOSPITAL_BASED_OUTPATIENT_CLINIC_OR_DEPARTMENT_OTHER)
Admission: RE | Admit: 2011-05-10 | Discharge: 2011-05-10 | Disposition: A | Discharge status: Home / Self Care | Payer: 59 | Source: Ambulatory Visit | Attending: Family Medicine | Admitting: Family Medicine

## 2011-05-10 DIAGNOSIS — H538 Other visual disturbances: Secondary | ICD-10-CM

## 2011-05-10 DIAGNOSIS — S0990XA Unspecified injury of head, initial encounter: Secondary | ICD-10-CM

## 2011-05-10 DIAGNOSIS — I679 Cerebrovascular disease, unspecified: Secondary | ICD-10-CM | POA: Insufficient documentation

## 2011-05-10 DIAGNOSIS — R42 Dizziness and giddiness: Secondary | ICD-10-CM

## 2011-06-16 ENCOUNTER — Encounter: Payer: Self-pay | Admitting: Family Medicine

## 2011-06-16 ENCOUNTER — Ambulatory Visit (INDEPENDENT_AMBULATORY_CARE_PROVIDER_SITE_OTHER): Payer: 59 | Admitting: Family Medicine

## 2011-06-16 VITALS — BP 118/78 | HR 69 | Temp 98.3°F | Wt 271.0 lb

## 2011-06-16 DIAGNOSIS — J329 Chronic sinusitis, unspecified: Secondary | ICD-10-CM

## 2011-06-16 DIAGNOSIS — J209 Acute bronchitis, unspecified: Secondary | ICD-10-CM

## 2011-06-16 DIAGNOSIS — J40 Bronchitis, not specified as acute or chronic: Secondary | ICD-10-CM

## 2011-06-16 MED ORDER — AMOXICILLIN-POT CLAVULANATE 875-125 MG PO TABS: 1.0000 | ORAL_TABLET | Freq: Two times a day (BID) | ORAL | Status: AC

## 2011-06-16 MED ORDER — GUAIFENESIN-CODEINE 100-10 MG/5ML PO SYRP: ORAL_SOLUTION | ORAL | Status: DC

## 2011-06-16 MED ORDER — PREDNISONE 10 MG PO TABS: ORAL_TABLET | ORAL | Status: DC

## 2011-06-16 MED ORDER — METHYLPREDNISOLONE ACETATE 80 MG/ML IJ SUSP
80.0000 mg | Freq: Once | INTRAMUSCULAR | Status: AC
  Administered 2011-06-16: 80 mg via INTRAMUSCULAR

## 2011-06-16 NOTE — Patient Instructions (Signed)

## 2011-06-16 NOTE — Progress Notes (Signed)
  Subjective:     Heather [name] is a 51 y.o. female here for evaluation of a cough. Onset of symptoms was 10 days ago. Symptoms have been gradually worsening since that time. The cough is productive and is aggravated by exercise and reclining position. Associated symptoms include: chills, fever, postnasal drip, shortness of breath and sputum production. Patient does not have a history of asthma. Patient does not have a history of environmental allergens. Patient has not traveled recently. Patient does not have a history of smoking. Patient has not had a previous chest x-ray. Patient has not had a PPD done.  The following portions of the patient's history were reviewed and updated as appropriate: allergies, current medications, past family history, past medical history, past social history, past surgical history and problem list.  Review of Systems Pertinent items are noted in HPI.    Objective:    Oxygen saturation 99% on room air BP 118/78  Pulse 69  Temp(Src) 98.3 F (36.8 C) (Oral)  Wt 271 lb (122.925 kg)  SpO2 99% General appearance: alert, cooperative, appears stated age and no distress Ears: abnormal TM right ear - erythematous, dull and retracted Nose: green discharge, mild congestion, turbinates pale, swollen, sinus tenderness bilateral Throat: abnormal findings: mild oropharyngeal erythema Neck: mild anterior cervical adenopathy, supple, symmetrical, trachea midline and thyroid not enlarged, symmetric, no tenderness/mass/nodules Lungs: wheezes bilaterally Heart: S1, S2 normal    Assessment:    Acute Bronchitis and Sinusitis    Plan:    Antibiotics per medication orders. Antitussives per medication orders. Avoid exposure to tobacco smoke and fumes. B-agonist inhaler. Call if shortness of breath worsens, blood in sputum, change in character of cough, development of fever or chills, inability to maintain nutrition and hydration. Avoid exposure to tobacco smoke and  fumes. pred taper and depomedrol

## 2011-06-30 ENCOUNTER — Ambulatory Visit (HOSPITAL_BASED_OUTPATIENT_CLINIC_OR_DEPARTMENT_OTHER)
Admission: RE | Admit: 2011-06-30 | Discharge: 2011-06-30 | Disposition: A | Discharge status: Home / Self Care | Payer: 59 | Source: Ambulatory Visit | Attending: Family Medicine | Admitting: Family Medicine

## 2011-06-30 ENCOUNTER — Encounter: Payer: Self-pay | Admitting: Family Medicine

## 2011-06-30 ENCOUNTER — Ambulatory Visit (INDEPENDENT_AMBULATORY_CARE_PROVIDER_SITE_OTHER): Payer: 59 | Admitting: Family Medicine

## 2011-06-30 VITALS — BP 118/66 | HR 98 | Temp 98.3°F | Wt 268.2 lb

## 2011-06-30 DIAGNOSIS — J40 Bronchitis, not specified as acute or chronic: Secondary | ICD-10-CM

## 2011-06-30 DIAGNOSIS — R079 Chest pain, unspecified: Secondary | ICD-10-CM

## 2011-06-30 DIAGNOSIS — R059 Cough, unspecified: Secondary | ICD-10-CM | POA: Insufficient documentation

## 2011-06-30 DIAGNOSIS — R05 Cough: Secondary | ICD-10-CM

## 2011-06-30 DIAGNOSIS — R0789 Other chest pain: Secondary | ICD-10-CM | POA: Insufficient documentation

## 2011-06-30 MED ORDER — MOXIFLOXACIN HCL 400 MG PO TABS: 400.0000 mg | ORAL_TABLET | Freq: Every day | ORAL | Status: AC

## 2011-06-30 NOTE — Progress Notes (Signed)
  Subjective:     Heather [name] is a 51 y.o. female here for evaluation of a cough. Onset of symptoms was a few weeks ago. Symptoms have been gradually improving since that time. See last ov.  Pt has finished prednisone and abx.  The cough is productive and is aggravated by nothing. Associated symptoms include: shortness of breath, sputum production and wheezing. Patient does not have a history of asthma. Patient does not have a history of environmental allergens. Patient has not traveled recently. Patient does not have a history of smoking. Patient has not had a previous chest x-ray. Patient has not had a PPD done.  The following portions of the patient's history were reviewed and updated as appropriate: allergies, current medications, past family history, past medical history, past social history, past surgical history and problem list.  Review of Systems Pertinent items are noted in HPI.    Objective:    Oxygen saturation 99% on room air BP 118/66  Pulse 98  Temp(Src) 98.3 F (36.8 C) (Oral)  Wt 268 lb 3.2 oz (121.655 kg)  SpO2 99% General appearance: alert, cooperative, appears stated age and no distress Ears: normal TM's and external ear canals both ears Nose: Nares normal. Septum midline. Mucosa normal. No drainage or sinus tenderness. Throat: abnormal findings: mild oropharyngeal erythema Neck: mild anterior cervical adenopathy, supple, symmetrical, trachea midline and thyroid not enlarged, symmetric, no tenderness/mass/nodules Lungs: diminished breath sounds bilaterally    Assessment:    Acute Bronchitis    Plan:    Antibiotics per medication orders. Antitussives per medication orders. Avoid exposure to tobacco smoke and fumes. B-agonist inhaler. Call if shortness of breath worsens, blood in sputum, change in character of cough, development of fever or chills, inability to maintain nutrition and hydration. Avoid exposure to tobacco smoke and fumes. Chest  x-ray. Steroid inhaler as ordered.

## 2011-06-30 NOTE — Patient Instructions (Signed)

## 2011-07-01 ENCOUNTER — Encounter: Payer: Self-pay | Admitting: Family Medicine

## 2011-08-05 ENCOUNTER — Ambulatory Visit (INDEPENDENT_AMBULATORY_CARE_PROVIDER_SITE_OTHER): Payer: 59 | Admitting: Family

## 2011-08-05 ENCOUNTER — Encounter: Payer: Self-pay | Admitting: Family

## 2011-08-05 VITALS — BP 132/80 | HR 80 | Temp 98.4°F | Resp 16 | Wt 271.1 lb

## 2011-08-05 DIAGNOSIS — J309 Allergic rhinitis, unspecified: Secondary | ICD-10-CM

## 2011-08-05 DIAGNOSIS — J329 Chronic sinusitis, unspecified: Secondary | ICD-10-CM

## 2011-08-05 MED ORDER — CEFUROXIME AXETIL 500 MG PO TABS: 500.0000 mg | ORAL_TABLET | Freq: Two times a day (BID) | ORAL | Status: AC

## 2011-08-05 NOTE — Progress Notes (Signed)
Subjective:    Patient ID: Heather [name]    DOB: Oct 04, 1960, 51 y.o.   MRN: [id number]  HPI  Ms.  [name] is a 51 yr old [name] who presents today with chief complaint of nasal congestion.  She describes associated pain around eyes, teeth hurting, sneezing fits and cough.  Reports that it "hurts to cough".  + nasal discharge- yellow.  She had chills yesterday.  She has tried zyrtec and mucinex without improvement. Symptoms worsened Friday 4/12 and were preceded by her typical nasal congestion which she has due to seasonal allergies. She continues veramyst.   Review of Systems See HPI  Past Medical History  Diagnosis Date  . Allergic rhinitis   . Hyperlipidemia   . Hypertension   . Glaucoma   . Acute bronchitis     History   Social History  . Marital Status: Single    Spouse Name: N/A    Number of Children: N/A  . Years of Education: 20   Occupational History  . [employer]--manage lab    Social History Main Topics  . Smoking status: Never Smoker   . Smokeless tobacco: Never Used  . Alcohol Use: No  . Drug Use: No  . Sexually Active: Yes   Other Topics Concern  . Not on file   Social History Narrative  . No narrative on file    Past Surgical History  Procedure Date  . Abdominal hysterectomy 1998  . Breast lumpectomy     Family History  Problem Relation Age of Onset  . Endometrial cancer    . Atrial fibrillation    . Dementia Father   . Stroke Father   . Cancer Father 21    prostate  . Atrial fibrillation Father   . Hyperlipidemia Brother   . Hypertension Brother   . Diabetes Maternal Aunt   . Diabetes Maternal Uncle   . Kidney disease Maternal Uncle     dialysis  . Cancer Maternal Grandmother     uterine  . Cancer Maternal Grandfather   . Cancer Paternal Grandfather     pancreatic  . Hyperlipidemia Brother   . Hypertension Brother   . Hypertension Brother   . Pulmonary embolism Brother   . Colon cancer Brother   .  Cancer Brother 75    colon CA--stage 4    Allergies  Allergen Reactions  . Tamiflu     Current Outpatient Prescriptions on File Prior to Visit  Medication Sig Dispense Refill  . ALPRAZolam (XANAX) 0.25 MG tablet Take 0.25 mg by mouth 3 (three) times daily as needed.        . Ascorbic Acid (VITAMIN C) 500 MG tablet Take 500 mg by mouth daily.        . brimonidine-timolol (COMBIGAN) 0.2-0.5 % ophthalmic solution Place 1 drop into both eyes every 12 (twelve) hours.        . Calcium Carbonate-Vitamin D (CALCIUM-VITAMIN D) 500-200 MG-UNIT per tablet Take 1 tablet by mouth as directed.        . ferrous sulfate 325 (65 FE) MG tablet Take 325 mg by mouth daily.        . fluticasone (VERAMYST) 27.5 MCG/SPRAY nasal spray 2 sprays by Nasal route daily.        . furosemide (LASIX) 20 MG tablet TAKE ONE TABLET BY MOUTH EVERY DAY  30 tablet  5  . guaiFENesin-codeine (ROBITUSSIN AC) 100-10 MG/5ML syrup 1-2 tsp po qpm prn  120 mL  0  .  lisinopril (PRINIVIL,ZESTRIL) 10 MG tablet Take 1 tablet (10 mg total) by mouth daily.  90 tablet  3  . LOVAZA 1 G capsule TAKE TWO CAPSULES BY MOUTH TWICE DAILY  120 each  5  . Multiple Vitamin (MULTIVITAMIN) tablet Take 1 tablet by mouth daily.        . simvastatin (ZOCOR) 40 MG tablet Take 1 tablet (40 mg total) by mouth at bedtime.  90 tablet  3  . vitamin B-12 (CYANOCOBALAMIN) 1000 MCG tablet Take 1,000 mcg by mouth as directed.          BP 132/80  Pulse 80  Temp(Src) 98.4 F (36.9 C) (Oral)  Resp 16  Wt 271 lb 1.9 oz (122.979 kg)  SpO2 97%       Objective:   Physical Exam  Constitutional: She appears well-developed and well-nourished. No distress.  HENT:  Mouth/Throat: No posterior oropharyngeal edema or posterior oropharyngeal erythema.       Frontal sinus tenderness to palpation R>L.  No maxillary sinus tenderness.  Bilateral TM's are pink in color without bulging.  Cardiovascular: Normal rate and regular rhythm.   No murmur  heard. Pulmonary/Chest: Effort normal and breath sounds normal. No respiratory distress. She has no wheezes. She has no rales. She exhibits no tenderness.  Lymphadenopathy:    She has no cervical adenopathy.  Psychiatric: She has a normal mood and affect. Her behavior is normal. Judgment and thought content normal.          Assessment & Plan:

## 2011-08-05 NOTE — Assessment & Plan Note (Signed)
Recommended that she continue veramyst and resume zyrtec.

## 2011-08-05 NOTE — Patient Instructions (Signed)

## 2011-08-05 NOTE — Assessment & Plan Note (Addendum)
Will rx with ceftin x 10 days.  Pt is instructed to contact us if symptoms worsen or if no improvement in 2-3 days.  Pt verbalizes understanding. Recommended delsym prn cough.

## 2011-08-22 ENCOUNTER — Other Ambulatory Visit: Payer: Self-pay | Admitting: Family Medicine

## 2011-09-10 ENCOUNTER — Other Ambulatory Visit: Payer: Self-pay | Admitting: Family Medicine

## 2011-11-10 ENCOUNTER — Ambulatory Visit (INDEPENDENT_AMBULATORY_CARE_PROVIDER_SITE_OTHER): Payer: 59 | Admitting: Family Medicine

## 2011-11-10 ENCOUNTER — Encounter: Payer: Self-pay | Admitting: Family Medicine

## 2011-11-10 VITALS — BP 122/70 | HR 75 | Temp 98.4°F | Wt 275.0 lb

## 2011-11-10 DIAGNOSIS — Z0000 Encounter for general adult medical examination without abnormal findings: Secondary | ICD-10-CM

## 2011-11-10 DIAGNOSIS — I10 Essential (primary) hypertension: Secondary | ICD-10-CM

## 2011-11-10 DIAGNOSIS — E785 Hyperlipidemia, unspecified: Secondary | ICD-10-CM

## 2011-11-10 MED ORDER — SIMVASTATIN 40 MG PO TABS: 40.0000 mg | ORAL_TABLET | Freq: Every day | ORAL | Status: DC

## 2011-11-10 MED ORDER — LISINOPRIL 10 MG PO TABS: 10.0000 mg | ORAL_TABLET | Freq: Every day | ORAL | Status: DC

## 2011-11-10 MED ORDER — FUROSEMIDE 20 MG PO TABS: 20.0000 mg | ORAL_TABLET | Freq: Every day | ORAL | Status: DC

## 2011-11-10 NOTE — Patient Instructions (Signed)

## 2011-11-10 NOTE — Progress Notes (Signed)
  Subjective:    Patient here for follow-up of elevated blood pressure.  She is exercising and is adherent to a low-salt diet.  Blood pressure is well controlled at home. Cardiac symptoms: none. Patient denies: chest pain, chest pressure/discomfort, claudication, dyspnea, exertional chest pressure/discomfort, fatigue, irregular heart beat, lower extremity edema, near-syncope, orthopnea, palpitations, paroxysmal nocturnal dyspnea, syncope and tachypnea. Cardiovascular risk factors: dyslipidemia, hypertension and obesity (BMI >= 30 kg/m2). Use of agents associated with hypertension: none. History of target organ damage: none.  The following portions of the patient's history were reviewed and updated as appropriate: allergies, current medications, past family history, past medical history, past social history, past surgical history and problem list.  Review of Systems Pertinent items are noted in HPI.     Objective:    BP 122/70  Pulse 75  Temp 98.4 F (36.9 C) (Oral)  Wt 275 lb (124.739 kg)  SpO2 97% General appearance: alert, cooperative, appears stated age and no distress Lungs: clear to auscultation bilaterally Heart: S1, S2 normal Extremities: extremities normal, atraumatic, no cyanosis or edema    Assessment:    Hypertension, normal blood pressure . Evidence of target organ damage: none.   hyperlipidemia--check labs Plan:    Medication: no change. Dietary sodium restriction. Regular aerobic exercise. Check blood pressures 2-3 times weekly and record. Follow up: 2 months and as needed. --for cpe

## 2011-12-08 ENCOUNTER — Other Ambulatory Visit: Payer: Self-pay | Admitting: Family Medicine

## 2011-12-08 DIAGNOSIS — I10 Essential (primary) hypertension: Secondary | ICD-10-CM

## 2011-12-08 MED ORDER — LISINOPRIL 10 MG PO TABS: 10.0000 mg | ORAL_TABLET | Freq: Every day | ORAL | Status: DC

## 2011-12-08 NOTE — Telephone Encounter (Signed)
LISINOPRIL 10 MG QTY:30 TID

## 2011-12-16 ENCOUNTER — Other Ambulatory Visit: Payer: 59

## 2011-12-16 ENCOUNTER — Other Ambulatory Visit (INDEPENDENT_AMBULATORY_CARE_PROVIDER_SITE_OTHER): Payer: 59

## 2011-12-16 DIAGNOSIS — Z0000 Encounter for general adult medical examination without abnormal findings: Secondary | ICD-10-CM

## 2011-12-16 DIAGNOSIS — E785 Hyperlipidemia, unspecified: Secondary | ICD-10-CM

## 2011-12-16 DIAGNOSIS — I10 Essential (primary) hypertension: Secondary | ICD-10-CM

## 2011-12-16 LAB — CBC WITH DIFFERENTIAL/PLATELET
Basophils Relative: 0.7 % (ref 0.0–3.0)
Eosinophils Relative: 1.4 % (ref 0.0–5.0)
Lymphocytes Relative: 49.6 % — ABNORMAL HIGH (ref 12.0–46.0)
Neutrophils Relative %: 42.6 % — ABNORMAL LOW (ref 43.0–77.0)
Platelets: 165 10*3/uL (ref 150.0–400.0)
RBC: 4.87 Mil/uL (ref 3.87–5.11)
WBC: 5.4 10*3/uL (ref 4.5–10.5)

## 2011-12-16 LAB — HEPATIC FUNCTION PANEL
ALT: 12 U/L (ref 0–35)
AST: 16 U/L (ref 0–37)
Albumin: 3.6 g/dL (ref 3.5–5.2)
Alkaline Phosphatase: 44 U/L (ref 39–117)
Total Protein: 6.9 g/dL (ref 6.0–8.3)

## 2011-12-16 LAB — URINALYSIS
Hgb urine dipstick: NEGATIVE
Nitrite: NEGATIVE
Specific Gravity, Urine: 1.025 (ref 1.000–1.030)
Total Protein, Urine: NEGATIVE
Urine Glucose: NEGATIVE
Urobilinogen, UA: 0.2 (ref 0.0–1.0)

## 2011-12-16 LAB — LIPID PANEL
Cholesterol: 160 mg/dL (ref 0–200)
LDL Cholesterol: 92 mg/dL (ref 0–99)
Triglycerides: 67 mg/dL (ref 0.0–149.0)

## 2011-12-16 LAB — TSH: TSH: 1.26 u[IU]/mL (ref 0.35–5.50)

## 2011-12-16 LAB — BASIC METABOLIC PANEL
BUN: 13 mg/dL (ref 6–23)
Calcium: 9.2 mg/dL (ref 8.4–10.5)
Creatinine, Ser: 0.7 mg/dL (ref 0.4–1.2)
GFR: 107.48 mL/min (ref >60.00)

## 2011-12-23 ENCOUNTER — Ambulatory Visit (INDEPENDENT_AMBULATORY_CARE_PROVIDER_SITE_OTHER): Payer: 59 | Admitting: Family Medicine

## 2011-12-23 ENCOUNTER — Encounter: Payer: Self-pay | Admitting: Family Medicine

## 2011-12-23 VITALS — BP 122/74 | HR 65 | Temp 98.3°F | Ht 65.75 in | Wt 274.6 lb

## 2011-12-23 DIAGNOSIS — E785 Hyperlipidemia, unspecified: Secondary | ICD-10-CM

## 2011-12-23 DIAGNOSIS — Z0000 Encounter for general adult medical examination without abnormal findings: Secondary | ICD-10-CM

## 2011-12-23 DIAGNOSIS — I10 Essential (primary) hypertension: Secondary | ICD-10-CM

## 2011-12-23 MED ORDER — OMEGA-3-ACID ETHYL ESTERS 1 G PO CAPS: 2.0000 g | ORAL_CAPSULE | Freq: Two times a day (BID) | ORAL | Status: DC

## 2011-12-23 MED ORDER — LISINOPRIL 10 MG PO TABS: 10.0000 mg | ORAL_TABLET | Freq: Every day | ORAL | Status: DC

## 2011-12-23 MED ORDER — SIMVASTATIN 40 MG PO TABS: 40.0000 mg | ORAL_TABLET | Freq: Every day | ORAL | Status: DC

## 2011-12-23 MED ORDER — FUROSEMIDE 20 MG PO TABS: 20.0000 mg | ORAL_TABLET | Freq: Every day | ORAL | Status: DC

## 2011-12-23 NOTE — Patient Instructions (Addendum)
Preventive Care for Adults, Female A healthy lifestyle and preventive care can promote health and wellness. Preventive health guidelines for women include the following key practices.  A routine yearly physical is a good way to check with your caregiver about your health and preventive screening. It is a chance to share any concerns and updates on your health, and to receive a thorough exam.   Visit your dentist for a routine exam and preventive care every 6 months. Brush your teeth twice a day and floss once a day. Good oral hygiene prevents tooth decay and gum disease.   The frequency of eye exams is based on your age, health, family medical history, use of contact lenses, and other factors. Follow your caregiver's recommendations for frequency of eye exams.   Eat a healthy diet. Foods like vegetables, fruits, whole grains, low-fat dairy products, and lean protein foods contain the nutrients you need without too many calories. Decrease your intake of foods high in solid fats, added sugars, and salt. Eat the right amount of calories for you.Get information about a proper diet from your caregiver, if necessary.   Regular physical exercise is one of the most important things you can do for your health. Most adults should get at least 150 minutes of moderate-intensity exercise (any activity that increases your heart rate and causes you to sweat) each week. In addition, most adults need muscle-strengthening exercises on 2 or more days a week.   Maintain a healthy weight. The body mass index (BMI) is a screening tool to identify possible weight problems. It provides an estimate of body fat based on height and weight. Your caregiver can help determine your BMI, and can help you achieve or maintain a healthy weight.For adults 20 years and older:   A BMI below 18.5 is considered underweight.   A BMI of 18.5 to 24.9 is normal.   A BMI of 25 to 29.9 is considered overweight.   A BMI of 30 and above is  considered obese.   Maintain normal blood lipids and cholesterol levels by exercising and minimizing your intake of saturated fat. Eat a balanced diet with plenty of fruit and vegetables. Blood tests for lipids and cholesterol should begin at age 20 and be repeated every 5 years. If your lipid or cholesterol levels are high, you are over 50, or you are at high risk for heart disease, you may need your cholesterol levels checked more frequently.Ongoing high lipid and cholesterol levels should be treated with medicines if diet and exercise are not effective.   If you smoke, find out from your caregiver how to quit. If you do not use tobacco, do not start.   If you are pregnant, do not drink alcohol. If you are breastfeeding, be very cautious about drinking alcohol. If you are not pregnant and choose to drink alcohol, do not exceed 1 drink per day. One drink is considered to be 12 ounces (355 mL) of beer, 5 ounces (148 mL) of wine, or 1.5 ounces (44 mL) of liquor.   Avoid use of street drugs. Do not share needles with anyone. Ask for help if you need support or instructions about stopping the use of drugs.   High blood pressure causes heart disease and increases the risk of stroke. Your blood pressure should be checked at least every 1 to 2 years. Ongoing high blood pressure should be treated with medicines if weight loss and exercise are not effective.   If you are 55 to 52   years old, ask your caregiver if you should take aspirin to prevent strokes.   Diabetes screening involves taking a blood sample to check your fasting blood sugar level. This should be done once every 3 years, after age 45, if you are within normal weight and without risk factors for diabetes. Testing should be considered at a younger age or be carried out more frequently if you are overweight and have at least 1 risk factor for diabetes.   Breast cancer screening is essential preventive care for women. You should practice "breast  self-awareness." This means understanding the normal appearance and feel of your breasts and may include breast self-examination. Any changes detected, no matter how small, should be reported to a caregiver. Women in their 20s and 30s should have a clinical breast exam (CBE) by a caregiver as part of a regular health exam every 1 to 3 years. After age 40, women should have a CBE every year. Starting at age 40, women should consider having a mammography (breast X-ray test) every year. Women who have a family history of breast cancer should talk to their caregiver about genetic screening. Women at a high risk of breast cancer should talk to their caregivers about having magnetic resonance imaging (MRI) and a mammography every year.   The Pap test is a screening test for cervical cancer. A Pap test can show cell changes on the cervix that might become cervical cancer if left untreated. A Pap test is a procedure in which cells are obtained and examined from the lower end of the uterus (cervix).   Women should have a Pap test starting at age 21.   Between ages 21 and 29, Pap tests should be repeated every 2 years.   Beginning at age 30, you should have a Pap test every 3 years as long as the past 3 Pap tests have been normal.   Some women have medical problems that increase the chance of getting cervical cancer. Talk to your caregiver about these problems. It is especially important to talk to your caregiver if a new problem develops soon after your last Pap test. In these cases, your caregiver may recommend more frequent screening and Pap tests.   The above recommendations are the same for women who have or have not gotten the vaccine for human papillomavirus (HPV).   If you had a hysterectomy for a problem that was not cancer or a condition that could lead to cancer, then you no longer need Pap tests. Even if you no longer need a Pap test, a regular exam is a good idea to make sure no other problems are  starting.   If you are between ages 65 and 70, and you have had normal Pap tests going back 10 years, you no longer need Pap tests. Even if you no longer need a Pap test, a regular exam is a good idea to make sure no other problems are starting.   If you have had past treatment for cervical cancer or a condition that could lead to cancer, you need Pap tests and screening for cancer for at least 20 years after your treatment.   If Pap tests have been discontinued, risk factors (such as a new sexual partner) need to be reassessed to determine if screening should be resumed.   The HPV test is an additional test that may be used for cervical cancer screening. The HPV test looks for the virus that can cause the cell changes on the cervix.   The cells collected during the Pap test can be tested for HPV. The HPV test could be used to screen women aged 30 years and older, and should be used in women of any age who have unclear Pap test results. After the age of 30, women should have HPV testing at the same frequency as a Pap test.   Colorectal cancer can be detected and often prevented. Most routine colorectal cancer screening begins at the age of 50 and continues through age 75. However, your caregiver may recommend screening at an earlier age if you have risk factors for colon cancer. On a yearly basis, your caregiver may provide home test kits to check for hidden blood in the stool. Use of a small camera at the end of a tube, to directly examine the colon (sigmoidoscopy or colonoscopy), can detect the earliest forms of colorectal cancer. Talk to your caregiver about this at age 50, when routine screening begins. Direct examination of the colon should be repeated every 5 to 10 years through age 75, unless early forms of pre-cancerous polyps or small growths are found.   Hepatitis C blood testing is recommended for all people born from 1945 through 1965 and any individual with known risks for hepatitis C.    Practice safe sex. Use condoms and avoid high-risk sexual practices to reduce the spread of sexually transmitted infections (STIs). STIs include gonorrhea, chlamydia, syphilis, trichomonas, herpes, HPV, and human immunodeficiency virus (HIV). Herpes, HIV, and HPV are viral illnesses that have no cure. They can result in disability, cancer, and death. Sexually active women aged 25 and younger should be checked for chlamydia. Older women with new or multiple partners should also be tested for chlamydia. Testing for other STIs is recommended if you are sexually active and at increased risk.   Osteoporosis is a disease in which the bones lose minerals and strength with aging. This can result in serious bone fractures. The risk of osteoporosis can be identified using a bone density scan. Women ages 65 and over and women at risk for fractures or osteoporosis should discuss screening with their caregivers. Ask your caregiver whether you should take a calcium supplement or vitamin D to reduce the rate of osteoporosis.   Menopause can be associated with physical symptoms and risks. Hormone replacement therapy is available to decrease symptoms and risks. You should talk to your caregiver about whether hormone replacement therapy is right for you.   Use sunscreen with sun protection factor (SPF) of 30 or more. Apply sunscreen liberally and repeatedly throughout the day. You should seek shade when your shadow is shorter than you. Protect yourself by wearing long sleeves, pants, a wide-brimmed hat, and sunglasses year round, whenever you are outdoors.   Once a month, do a whole body skin exam, using a mirror to look at the skin on your back. Notify your caregiver of new moles, moles that have irregular borders, moles that are larger than a pencil eraser, or moles that have changed in shape or color.   Stay current with required immunizations.   Influenza. You need a dose every fall (or winter). The composition of  the flu vaccine changes each year, so being vaccinated once is not enough.   Pneumococcal polysaccharide. You need 1 to 2 doses if you smoke cigarettes or if you have certain chronic medical conditions. You need 1 dose at age 65 (or older) if you have never been vaccinated.   Tetanus, diphtheria, pertussis (Tdap, Td). Get 1 dose of   Tdap vaccine if you are younger than age 65, are over 65 and have contact with an infant, are a healthcare worker, are pregnant, or simply want to be protected from whooping cough. After that, you need a Td booster dose every 10 years. Consult your caregiver if you have not had at least 3 tetanus and diphtheria-containing shots sometime in your life or have a deep or dirty wound.   HPV. You need this vaccine if you are a woman age 26 or younger. The vaccine is given in 3 doses over 6 months.   Measles, mumps, rubella (MMR). You need at least 1 dose of MMR if you were born in 1957 or later. You may also need a second dose.   Meningococcal. If you are age 19 to 21 and a first-year college student living in a residence hall, or have one of several medical conditions, you need to get vaccinated against meningococcal disease. You may also need additional booster doses.   Zoster (shingles). If you are age 60 or older, you should get this vaccine.   Varicella (chickenpox). If you have never had chickenpox or you were vaccinated but received only 1 dose, talk to your caregiver to find out if you need this vaccine.   Hepatitis A. You need this vaccine if you have a specific risk factor for hepatitis A virus infection or you simply wish to be protected from this disease. The vaccine is usually given as 2 doses, 6 to 18 months apart.   Hepatitis B. You need this vaccine if you have a specific risk factor for hepatitis B virus infection or you simply wish to be protected from this disease. The vaccine is given in 3 doses, usually over 6 months.  Preventive Services /  Frequency Ages 19 to 39  Blood pressure check.** / Every 1 to 2 years.   Lipid and cholesterol check.** / Every 5 years beginning at age 20.   Clinical breast exam.** / Every 3 years for women in their 20s and 30s.   Pap test.** / Every 2 years from ages 21 through 29. Every 3 years starting at age 30 through age 65 or 70 with a history of 3 consecutive normal Pap tests.   HPV screening.** / Every 3 years from ages 30 through ages 65 to 70 with a history of 3 consecutive normal Pap tests.   Hepatitis C blood test.** / For any individual with known risks for hepatitis C.   Skin self-exam. / Monthly.   Influenza immunization.** / Every year.   Pneumococcal polysaccharide immunization.** / 1 to 2 doses if you smoke cigarettes or if you have certain chronic medical conditions.   Tetanus, diphtheria, pertussis (Tdap, Td) immunization. / A one-time dose of Tdap vaccine. After that, you need a Td booster dose every 10 years.   HPV immunization. / 3 doses over 6 months, if you are 26 and younger.   Measles, mumps, rubella (MMR) immunization. / You need at least 1 dose of MMR if you were born in 1957 or later. You may also need a second dose.   Meningococcal immunization. / 1 dose if you are age 19 to 21 and a first-year college student living in a residence hall, or have one of several medical conditions, you need to get vaccinated against meningococcal disease. You may also need additional booster doses.   Varicella immunization.** / Consult your caregiver.   Hepatitis A immunization.** / Consult your caregiver. 2 doses, 6 to 18 months   apart.   Hepatitis B immunization.** / Consult your caregiver. 3 doses usually over 6 months.  Ages 40 to 64  Blood pressure check.** / Every 1 to 2 years.   Lipid and cholesterol check.** / Every 5 years beginning at age 20.   Clinical breast exam.** / Every year after age 40.   Mammogram.** / Every year beginning at age 40 and continuing for as  long as you are in good health. Consult with your caregiver.   Pap test.** / Every 3 years starting at age 30 through age 65 or 70 with a history of 3 consecutive normal Pap tests.   HPV screening.** / Every 3 years from ages 30 through ages 65 to 70 with a history of 3 consecutive normal Pap tests.   Fecal occult blood test (FOBT) of stool. / Every year beginning at age 50 and continuing until age 75. You may not need to do this test if you get a colonoscopy every 10 years.   Flexible sigmoidoscopy or colonoscopy.** / Every 5 years for a flexible sigmoidoscopy or every 10 years for a colonoscopy beginning at age 50 and continuing until age 75.   Hepatitis C blood test.** / For all people born from 1945 through 1965 and any individual with known risks for hepatitis C.   Skin self-exam. / Monthly.   Influenza immunization.** / Every year.   Pneumococcal polysaccharide immunization.** / 1 to 2 doses if you smoke cigarettes or if you have certain chronic medical conditions.   Tetanus, diphtheria, pertussis (Tdap, Td) immunization.** / A one-time dose of Tdap vaccine. After that, you need a Td booster dose every 10 years.   Measles, mumps, rubella (MMR) immunization. / You need at least 1 dose of MMR if you were born in 1957 or later. You may also need a second dose.   Varicella immunization.** / Consult your caregiver.   Meningococcal immunization.** / Consult your caregiver.   Hepatitis A immunization.** / Consult your caregiver. 2 doses, 6 to 18 months apart.   Hepatitis B immunization.** / Consult your caregiver. 3 doses, usually over 6 months.  Ages 65 and over  Blood pressure check.** / Every 1 to 2 years.   Lipid and cholesterol check.** / Every 5 years beginning at age 20.   Clinical breast exam.** / Every year after age 40.   Mammogram.** / Every year beginning at age 40 and continuing for as long as you are in good health. Consult with your caregiver.   Pap test.** /  Every 3 years starting at age 30 through age 65 or 70 with a 3 consecutive normal Pap tests. Testing can be stopped between 65 and 70 with 3 consecutive normal Pap tests and no abnormal Pap or HPV tests in the past 10 years.   HPV screening.** / Every 3 years from ages 30 through ages 65 or 70 with a history of 3 consecutive normal Pap tests. Testing can be stopped between 65 and 70 with 3 consecutive normal Pap tests and no abnormal Pap or HPV tests in the past 10 years.   Fecal occult blood test (FOBT) of stool. / Every year beginning at age 50 and continuing until age 75. You may not need to do this test if you get a colonoscopy every 10 years.   Flexible sigmoidoscopy or colonoscopy.** / Every 5 years for a flexible sigmoidoscopy or every 10 years for a colonoscopy beginning at age 50 and continuing until age 75.   Hepatitis   C blood test.** / For all people born from 1945 through 1965 and any individual with known risks for hepatitis C.   Osteoporosis screening.** / A one-time screening for women ages 65 and over and women at risk for fractures or osteoporosis.   Skin self-exam. / Monthly.   Influenza immunization.** / Every year.   Pneumococcal polysaccharide immunization.** / 1 dose at age 65 (or older) if you have never been vaccinated.   Tetanus, diphtheria, pertussis (Tdap, Td) immunization. / A one-time dose of Tdap vaccine if you are over 65 and have contact with an infant, are a healthcare worker, or simply want to be protected from whooping cough. After that, you need a Td booster dose every 10 years.   Varicella immunization.** / Consult your caregiver.   Meningococcal immunization.** / Consult your caregiver.   Hepatitis A immunization.** / Consult your caregiver. 2 doses, 6 to 18 months apart.   Hepatitis B immunization.** / Check with your caregiver. 3 doses, usually over 6 months.  ** Family history and personal history of risk and conditions may change your caregiver's  recommendations. Document Released: 06/03/2001 Document Revised: 03/27/2011 Document Reviewed: 09/02/2010 [employer] Patient Information 2012 [employer]. 

## 2011-12-23 NOTE — Progress Notes (Signed)
  Subjective:     Heather [name] is a 51 y.o. female and is here for a comprehensive physical exam. The patient reports problems - headaches -- more frequent-- around eyes 3-4 x a week.  History   Social History  . Marital Status: Single    Spouse Name: N/A    Number of Children: N/A  . Years of Education: 20   Occupational History  . [employer]    Social History Main Topics  . Smoking status: Never Smoker   . Smokeless tobacco: Never Used  . Alcohol Use: No  . Drug Use: No  . Sexually Active: Yes   Other Topics Concern  . Not on file   Social History Narrative   Exercise-- no   Health Maintenance  Topic Date Due  . Pap Smear  12/23/2011  . Influenza Vaccine  01/20/2012  . Mammogram  06/22/2013  . Tetanus/tdap  07/25/2018  . Colonoscopy  10/31/2019    The following portions of the patient's history were reviewed and updated as appropriate: allergies, current medications, past family history, past medical history, past social history, past surgical history and problem list.  Review of Systems Review of Systems  Constitutional: Negative for activity change, appetite change and fatigue.  HENT: Negative for hearing loss, congestion, tinnitus and ear discharge.  dentist q22m Eyes: Negative for visual disturbance (see optho q1y -- vision corrected to 20/20 with glasses).  Respiratory: Negative for cough, chest tightness and shortness of breath.   Cardiovascular: Negative for chest pain, palpitations and leg swelling.  Gastrointestinal: Negative for abdominal pain, diarrhea, constipation and abdominal distention.  Genitourinary: Negative for urgency, frequency, decreased urine volume and difficulty urinating.  Musculoskeletal: Negative for back pain, arthralgias and gait problem.  Skin: Negative for color change, pallor and rash.  Neurological: Negative for dizziness, light-headedness, numbness and headaches.  Hematological: Negative for adenopathy.  Does not bruise/bleed easily.  Psychiatric/Behavioral: Negative for suicidal ideas, confusion, sleep disturbance, self-injury, dysphoric mood, decreased concentration and agitation.       Objective:    BP 122/74  Pulse 65  Temp 98.3 F (36.8 C) (Oral)  Ht 5' 5.75" (1.67 m)  Wt 274 lb 9.6 oz (124.558 kg)  BMI 44.66 kg/m2  SpO2 97% General appearance: alert, cooperative, appears stated age and no distress Head: Normocephalic, without obvious abnormality, atraumatic Eyes: conjunctivae/corneas clear. PERRL, EOM's intact. Fundi benign. Ears: normal TM's and external ear canals both ears Nose: Nares normal. Septum midline. Mucosa normal. No drainage or sinus tenderness. Throat: lips, mucosa, and tongue normal; teeth and gums normal Neck: no adenopathy, no carotid bruit, no JVD, supple, symmetrical, trachea midline and thyroid not enlarged, symmetric, no tenderness/mass/nodules Back: symmetric, no curvature. ROM normal. No CVA tenderness. Lungs: clear to auscultation bilaterally Breasts: normal appearance, no masses or tenderness Heart: regular rate and rhythm, S1, S2 normal, no murmur, click, rub or gallop Abdomen: soft, non-tender; bowel sounds normal; no masses,  no organomegaly Pelvic: not indicated; post-menopausal, no abnormal Pap smears in past Extremities: extremities normal, atraumatic, no cyanosis or edema Pulses: 2+ and symmetric Skin: Skin color, texture, turgor normal. No rashes or lesions Lymph nodes: Cervical, supraclavicular, and axillary nodes normal. Neurologic: Alert and oriented X 3, normal strength and tone. Normal symmetric reflexes. Normal coordination and gait psych-- no depression or anxiety    Assessment:    Healthy female exam.     Plan:     See After Visit Summary for Counseling Recommendations

## 2011-12-24 NOTE — Assessment & Plan Note (Signed)
Check labs 

## 2011-12-24 NOTE — Assessment & Plan Note (Signed)
Cont meds Check labs 

## 2012-06-05 ENCOUNTER — Other Ambulatory Visit: Payer: Self-pay

## 2012-07-05 ENCOUNTER — Telehealth: Payer: Self-pay | Admitting: Family Medicine

## 2012-07-05 DIAGNOSIS — I10 Essential (primary) hypertension: Secondary | ICD-10-CM

## 2012-07-05 DIAGNOSIS — E785 Hyperlipidemia, unspecified: Secondary | ICD-10-CM

## 2012-07-05 MED ORDER — SIMVASTATIN 40 MG PO TABS: 40.0000 mg | ORAL_TABLET | Freq: Every day | ORAL | Status: DC

## 2012-07-05 MED ORDER — FUROSEMIDE 20 MG PO TABS: 20.0000 mg | ORAL_TABLET | Freq: Every day | ORAL | Status: DC

## 2012-07-05 MED ORDER — LISINOPRIL 10 MG PO TABS: 10.0000 mg | ORAL_TABLET | Freq: Every day | ORAL | Status: DC

## 2012-07-05 NOTE — Telephone Encounter (Signed)
Refill: Simvastatin 40 mg tab. Take one tablet by mouth at bedtime. Qty 90. Last fill 05-29-12  Furosemide 20 mg tab. Take one tablet by mouth every day. Qty 90. Last fill 04-16-12  Lisinopril 10 mg tab. Take one tablet by mouth every day. Qty 90. Last fill 04-16-12

## 2012-09-07 ENCOUNTER — Encounter: Payer: Self-pay | Admitting: Family Medicine

## 2012-09-08 ENCOUNTER — Encounter: Payer: Self-pay | Admitting: Family Medicine

## 2012-10-06 ENCOUNTER — Other Ambulatory Visit: Payer: Self-pay | Admitting: Family Medicine

## 2012-11-29 ENCOUNTER — Telehealth: Payer: Self-pay | Admitting: Family Medicine

## 2012-11-29 ENCOUNTER — Other Ambulatory Visit: Payer: Self-pay | Admitting: Family Medicine

## 2012-11-29 DIAGNOSIS — E785 Hyperlipidemia, unspecified: Secondary | ICD-10-CM

## 2012-11-29 DIAGNOSIS — I10 Essential (primary) hypertension: Secondary | ICD-10-CM

## 2012-11-29 NOTE — Telephone Encounter (Signed)
Order in.

## 2012-11-29 NOTE — Telephone Encounter (Signed)
Please advise      KP 

## 2012-11-29 NOTE — Telephone Encounter (Signed)
Please schedule.    KP 

## 2012-11-29 NOTE — Telephone Encounter (Signed)
Patient is coming in for her CPE on 01/07/13 and wants to have her labs done ahead of time on 01/03/13. Says her insurance will cover. Needs orders for labs.

## 2013-01-03 ENCOUNTER — Other Ambulatory Visit (INDEPENDENT_AMBULATORY_CARE_PROVIDER_SITE_OTHER): Payer: 59

## 2013-01-03 DIAGNOSIS — I10 Essential (primary) hypertension: Secondary | ICD-10-CM

## 2013-01-03 DIAGNOSIS — E785 Hyperlipidemia, unspecified: Secondary | ICD-10-CM

## 2013-01-03 LAB — CBC WITH DIFFERENTIAL/PLATELET
Basophils Absolute: 0.1 10*3/uL (ref 0.0–0.1)
Eosinophils Absolute: 0.1 10*3/uL (ref 0.0–0.7)
Hemoglobin: 11.9 g/dL — ABNORMAL LOW (ref 12.0–15.0)
Lymphocytes Relative: 49.9 % — ABNORMAL HIGH (ref 12.0–46.0)
Lymphs Abs: 2.5 10*3/uL (ref 0.7–4.0)
MCHC: 32.5 g/dL (ref 30.0–36.0)
MCV: 76.3 fl — ABNORMAL LOW (ref 78.0–100.0)
Monocytes Absolute: 0.3 10*3/uL (ref 0.1–1.0)
Neutro Abs: 2 10*3/uL (ref 1.4–7.7)
RDW: 16.5 % — ABNORMAL HIGH (ref 11.5–14.6)

## 2013-01-03 LAB — LIPID PANEL
Cholesterol: 202 mg/dL — ABNORMAL HIGH (ref 0–200)
HDL: 52 mg/dL (ref >39.00)
Triglycerides: 74 mg/dL (ref 0.0–149.0)
VLDL: 14.8 mg/dL (ref 0.0–40.0)

## 2013-01-03 LAB — POCT URINALYSIS DIPSTICK
Blood, UA: NEGATIVE
Ketones, UA: NEGATIVE
Protein, UA: NEGATIVE
Spec Grav, UA: 1.01
Urobilinogen, UA: 0.2

## 2013-01-03 LAB — HEPATIC FUNCTION PANEL
Albumin: 3.9 g/dL (ref 3.5–5.2)
Total Protein: 6.7 g/dL (ref 6.0–8.3)

## 2013-01-03 LAB — BASIC METABOLIC PANEL
CO2: 29 mEq/L (ref 19–32)
Calcium: 9.2 mg/dL (ref 8.4–10.5)
Chloride: 105 mEq/L (ref 96–112)
Glucose, Bld: 98 mg/dL (ref 70–99)
Sodium: 138 mEq/L (ref 135–145)

## 2013-01-03 LAB — MICROALBUMIN / CREATININE URINE RATIO: Microalb Creat Ratio: 0.2 mg/g (ref 0.0–30.0)

## 2013-01-03 LAB — LDL CHOLESTEROL, DIRECT: Direct LDL: 128 mg/dL

## 2013-01-06 ENCOUNTER — Telehealth: Payer: Self-pay

## 2013-01-06 NOTE — Telephone Encounter (Signed)
Assessment/Plan: --Medications updated --Last PAP 12/2008-PAP due --[facility]-09/08/2012 Current  --Colon Cancer Screening--11/08/2009 Current next due 2016 --Patient concerns: Pain in Knees will discuss at visit. --Receives flu shot at work

## 2013-01-07 ENCOUNTER — Other Ambulatory Visit (HOSPITAL_COMMUNITY)
Admission: RE | Admit: 2013-01-07 | Discharge: 2013-01-07 | Disposition: A | Discharge status: Home / Self Care | Payer: 59 | Source: Ambulatory Visit | Attending: Family Medicine | Admitting: Family Medicine

## 2013-01-07 ENCOUNTER — Encounter: Payer: Self-pay | Admitting: Family Medicine

## 2013-01-07 ENCOUNTER — Ambulatory Visit (INDEPENDENT_AMBULATORY_CARE_PROVIDER_SITE_OTHER): Payer: 59 | Admitting: Family Medicine

## 2013-01-07 VITALS — BP 120/70 | HR 66 | Temp 98.1°F | Ht 65.75 in | Wt 252.0 lb

## 2013-01-07 DIAGNOSIS — E669 Obesity, unspecified: Secondary | ICD-10-CM | POA: Insufficient documentation

## 2013-01-07 DIAGNOSIS — Z0000 Encounter for general adult medical examination without abnormal findings: Secondary | ICD-10-CM

## 2013-01-07 DIAGNOSIS — M179 Osteoarthritis of knee, unspecified: Secondary | ICD-10-CM

## 2013-01-07 DIAGNOSIS — Z124 Encounter for screening for malignant neoplasm of cervix: Secondary | ICD-10-CM

## 2013-01-07 DIAGNOSIS — E785 Hyperlipidemia, unspecified: Secondary | ICD-10-CM

## 2013-01-07 DIAGNOSIS — M25569 Pain in unspecified knee: Secondary | ICD-10-CM

## 2013-01-07 DIAGNOSIS — M1710 Unilateral primary osteoarthritis, unspecified knee: Secondary | ICD-10-CM

## 2013-01-07 DIAGNOSIS — I10 Essential (primary) hypertension: Secondary | ICD-10-CM

## 2013-01-07 DIAGNOSIS — IMO0002 Reserved for concepts with insufficient information to code with codable children: Secondary | ICD-10-CM

## 2013-01-07 DIAGNOSIS — M25561 Pain in right knee: Secondary | ICD-10-CM

## 2013-01-07 DIAGNOSIS — Z01419 Encounter for gynecological examination (general) (routine) without abnormal findings: Secondary | ICD-10-CM | POA: Insufficient documentation

## 2013-01-07 MED ORDER — MELOXICAM 15 MG PO TABS: ORAL_TABLET | ORAL | Status: DC

## 2013-01-07 MED ORDER — LISINOPRIL 10 MG PO TABS: ORAL_TABLET | ORAL | Status: DC

## 2013-01-07 MED ORDER — PRAVASTATIN SODIUM 20 MG PO TABS: 20.0000 mg | ORAL_TABLET | Freq: Every day | ORAL | Status: DC

## 2013-01-07 MED ORDER — FUROSEMIDE 20 MG PO TABS: ORAL_TABLET | ORAL | Status: DC

## 2013-01-07 NOTE — Progress Notes (Signed)
Subjective:     Heather [name] is a 52 y.o. female and is here for a comprehensive physical exam. The patient reports problems - b/l knee pain, no known injury.  History   Social History  . Marital Status: Single    Spouse Name: N/A    Number of Children: N/A  . Years of Education: 20   Occupational History  . [employer]manage lab    Social History Main Topics  . Smoking status: Never Smoker   . Smokeless tobacco: Never Used  . Alcohol Use: No  . Drug Use: No  . Sexual Activity: Yes   Other Topics Concern  . Not on file   Social History Narrative   Exercise-- no   Health Maintenance  Topic Date Due  . Pap Smear  12/23/2011  . Influenza Vaccine  11/19/2012  . Mammogram  07/13/2014  . Tetanus/tdap  07/25/2018  . Colonoscopy  10/31/2019    The following portions of the patient's history were reviewed and updated as appropriate:  She  has a past medical history of Allergic rhinitis; Hyperlipidemia; Hypertension; Glaucoma; and Acute bronchitis. She  does not have any pertinent problems on file. She  has past surgical history that includes Abdominal hysterectomy (1998) and Breast lumpectomy. Her family history includes Alzheimer's disease in her father; Atrial fibrillation in her father and another family member; Cancer in her maternal grandfather, maternal grandmother, and paternal grandfather; Cancer (age of onset: 11) in her father; Cancer (age of onset: 42) in her brother; Colon cancer in her brother; Dementia in her father; Diabetes in her maternal aunt and maternal uncle; Endometrial cancer in an other family member; Hyperlipidemia in her brother and brother; Hypertension in her brother, brother, and brother; Kidney disease in her maternal uncle; Pulmonary embolism in her brother; Stroke in her father. She  reports that she has never smoked. She has never used smokeless tobacco. She reports that she does not drink alcohol or use illicit drugs. She has  a current medication list which includes the following prescription(s): alprazolam, brimonidine-timolol, fluticasone, furosemide, glucosamine-chondroitin, lisinopril, multivitamin, meloxicam, and pravastatin. Current Outpatient Prescriptions on File Prior to Visit  Medication Sig Dispense Refill  . ALPRAZolam (XANAX) 0.25 MG tablet Take 0.25 mg by mouth 3 (three) times daily as needed.        . brimonidine-timolol (COMBIGAN) 0.2-0.5 % ophthalmic solution Place 1 drop into both eyes every 12 (twelve) hours.        . fluticasone (VERAMYST) 27.5 MCG/SPRAY nasal spray 2 sprays by Nasal route daily.        . Multiple Vitamin (MULTIVITAMIN) tablet Take 1 tablet by mouth daily.         No current facility-administered medications on file prior to visit.   She is allergic to tamiflu..  Review of Systems Review of Systems  Constitutional: Negative for activity change, appetite change and fatigue.  HENT: Negative for hearing loss, congestion, tinnitus and ear discharge.  dentist q29m Eyes: Negative for visual disturbance (see optho q1y -- vision corrected to 20/20 with glasses).  Respiratory: Negative for cough, chest tightness and shortness of breath.   Cardiovascular: Negative for chest pain, palpitations and leg swelling.  Gastrointestinal: Negative for abdominal pain, diarrhea, constipation and abdominal distention.  Genitourinary: Negative for urgency, frequency, decreased urine volume and difficulty urinating.  Musculoskeletal: Negative for back pain, arthralgias and gait problem.  Skin: Negative for color change, pallor and rash.  Neurological: Negative for dizziness, light-headedness, numbness and headaches.  Hematological:  Negative for adenopathy. Does not bruise/bleed easily.  Psychiatric/Behavioral: Negative for suicidal ideas, confusion, sleep disturbance, self-injury, dysphoric mood, decreased concentration and agitation.       Objective:    BP 120/70  Pulse 66  Temp(Src) 98.1 F  (36.7 C) (Oral)  Ht 5' 5.75" (1.67 m)  Wt 252 lb (114.306 kg)  BMI 40.99 kg/m2  SpO2 97% General appearance: alert, cooperative, appears stated age and no distress Head: Normocephalic, without obvious abnormality, atraumatic Eyes: conjunctivae/corneas clear. PERRL, EOM's intact. Fundi benign. Ears: normal TM's and external ear canals both ears Nose: Nares normal. Septum midline. Mucosa normal. No drainage or sinus tenderness. Throat: lips, mucosa, and tongue normal; teeth and gums normal Neck: no adenopathy, no carotid bruit, no JVD, supple, symmetrical, trachea midline and thyroid not enlarged, symmetric, no tenderness/mass/nodules Back: symmetric, no curvature. ROM normal. No CVA tenderness. Lungs: clear to auscultation bilaterally Breasts: Inspection negative, No nipple retraction or dimpling, No nipple discharge or bleeding, No axillary or supraclavicular adenopathy, positive findings: soft and non-tender nodule located on the left upper outer quadrant Heart: regular rate and rhythm, S1, S2 normal, no murmur, click, rub or gallop Abdomen: soft, non-tender; bowel sounds normal; no masses,  no organomegaly Pelvic: external genitalia normal, no adnexal masses or tenderness, rectovaginal septum normal, uterus surgically absent and vagina normal without discharge Extremities: extremities normal, atraumatic, no cyanosis or edema--+ crepitus and popping b/l knees Pulses: 2+ and symmetric Skin: Skin color, texture, turgor normal. No rashes or lesions Lymph nodes: Cervical, supraclavicular, and axillary nodes normal. Neurologic: Alert and oriented X 3, normal strength and tone. Normal symmetric reflexes. Normal coordination and gait Psych-- no depression, no anxiety      Assessment:    Healthy female exam.       Plan:    ghm utd Check labs See After Visit Summary for Counseling Recommendations

## 2013-01-07 NOTE — Assessment & Plan Note (Signed)
mobic qd Ice, elevation rto or call prn

## 2013-01-07 NOTE — Assessment & Plan Note (Signed)
Stable con't med 

## 2013-01-07 NOTE — Patient Instructions (Signed)
Preventive Care for Adults, Female A healthy lifestyle and preventive care can promote health and wellness. Preventive health guidelines for women include the following key practices.  A routine yearly physical is a good way to check with your caregiver about your health and preventive screening. It is a chance to share any concerns and updates on your health, and to receive a thorough exam.  Visit your dentist for a routine exam and preventive care every 6 months. Brush your teeth twice a day and floss once a day. Good oral hygiene prevents tooth decay and gum disease.  The frequency of eye exams is based on your age, health, family medical history, use of contact lenses, and other factors. Follow your caregiver's recommendations for frequency of eye exams.  Eat a healthy diet. Foods like vegetables, fruits, whole grains, low-fat dairy products, and lean protein foods contain the nutrients you need without too many calories. Decrease your intake of foods high in solid fats, added sugars, and salt. Eat the right amount of calories for you.Get information about a proper diet from your caregiver, if necessary.  Regular physical exercise is one of the most important things you can do for your health. Most adults should get at least 150 minutes of moderate-intensity exercise (any activity that increases your heart rate and causes you to sweat) each week. In addition, most adults need muscle-strengthening exercises on 2 or more days a week.  Maintain a healthy weight. The body mass index (BMI) is a screening tool to identify possible weight problems. It provides an estimate of body fat based on height and weight. Your caregiver can help determine your BMI, and can help you achieve or maintain a healthy weight.For adults 20 years and older:  A BMI below 18.5 is considered underweight.  A BMI of 18.5 to 24.9 is normal.  A BMI of 25 to 29.9 is considered overweight.  A BMI of 30 and above is  considered obese.  Maintain normal blood lipids and cholesterol levels by exercising and minimizing your intake of saturated fat. Eat a balanced diet with plenty of fruit and vegetables. Blood tests for lipids and cholesterol should begin at age 20 and be repeated every 5 years. If your lipid or cholesterol levels are high, you are over 50, or you are at high risk for heart disease, you may need your cholesterol levels checked more frequently.Ongoing high lipid and cholesterol levels should be treated with medicines if diet and exercise are not effective.  If you smoke, find out from your caregiver how to quit. If you do not use tobacco, do not start.  If you are pregnant, do not drink alcohol. If you are breastfeeding, be very cautious about drinking alcohol. If you are not pregnant and choose to drink alcohol, do not exceed 1 drink per day. One drink is considered to be 12 ounces (355 mL) of beer, 5 ounces (148 mL) of wine, or 1.5 ounces (44 mL) of liquor.  Avoid use of street drugs. Do not share needles with anyone. Ask for help if you need support or instructions about stopping the use of drugs.  High blood pressure causes heart disease and increases the risk of stroke. Your blood pressure should be checked at least every 1 to 2 years. Ongoing high blood pressure should be treated with medicines if weight loss and exercise are not effective.  If you are 55 to 53 years old, ask your caregiver if you should take aspirin to prevent strokes.  Diabetes   screening involves taking a blood sample to check your fasting blood sugar level. This should be done once every 3 years, after age 45, if you are within normal weight and without risk factors for diabetes. Testing should be considered at a younger age or be carried out more frequently if you are overweight and have at least 1 risk factor for diabetes.  Breast cancer screening is essential preventive care for women. You should practice "breast  self-awareness." This means understanding the normal appearance and feel of your breasts and may include breast self-examination. Any changes detected, no matter how small, should be reported to a caregiver. Women in their 20s and 30s should have a clinical breast exam (CBE) by a caregiver as part of a regular health exam every 1 to 3 years. After age 40, women should have a CBE every year. Starting at age 40, women should consider having a mammography (breast X-ray test) every year. Women who have a family history of breast cancer should talk to their caregiver about genetic screening. Women at a high risk of breast cancer should talk to their caregivers about having magnetic resonance imaging (MRI) and a mammography every year.  The Pap test is a screening test for cervical cancer. A Pap test can show cell changes on the cervix that might become cervical cancer if left untreated. A Pap test is a procedure in which cells are obtained and examined from the lower end of the uterus (cervix).  Women should have a Pap test starting at age 21.  Between ages 21 and 29, Pap tests should be repeated every 2 years.  Beginning at age 30, you should have a Pap test every 3 years as long as the past 3 Pap tests have been normal.  Some women have medical problems that increase the chance of getting cervical cancer. Talk to your caregiver about these problems. It is especially important to talk to your caregiver if a new problem develops soon after your last Pap test. In these cases, your caregiver may recommend more frequent screening and Pap tests.  The above recommendations are the same for women who have or have not gotten the vaccine for human papillomavirus (HPV).  If you had a hysterectomy for a problem that was not cancer or a condition that could lead to cancer, then you no longer need Pap tests. Even if you no longer need a Pap test, a regular exam is a good idea to make sure no other problems are  starting.  If you are between ages 65 and 70, and you have had normal Pap tests going back 10 years, you no longer need Pap tests. Even if you no longer need a Pap test, a regular exam is a good idea to make sure no other problems are starting.  If you have had past treatment for cervical cancer or a condition that could lead to cancer, you need Pap tests and screening for cancer for at least 20 years after your treatment.  If Pap tests have been discontinued, risk factors (such as a new sexual partner) need to be reassessed to determine if screening should be resumed.  The HPV test is an additional test that may be used for cervical cancer screening. The HPV test looks for the virus that can cause the cell changes on the cervix. The cells collected during the Pap test can be tested for HPV. The HPV test could be used to screen women aged 30 years and older, and should   be used in women of any age who have unclear Pap test results. After the age of 30, women should have HPV testing at the same frequency as a Pap test.  Colorectal cancer can be detected and often prevented. Most routine colorectal cancer screening begins at the age of 50 and continues through age 75. However, your caregiver may recommend screening at an earlier age if you have risk factors for colon cancer. On a yearly basis, your caregiver may provide home test kits to check for hidden blood in the stool. Use of a small camera at the end of a tube, to directly examine the colon (sigmoidoscopy or colonoscopy), can detect the earliest forms of colorectal cancer. Talk to your caregiver about this at age 50, when routine screening begins. Direct examination of the colon should be repeated every 5 to 10 years through age 75, unless early forms of pre-cancerous polyps or small growths are found.  Hepatitis C blood testing is recommended for all people born from 1945 through 1965 and any individual with known risks for hepatitis C.  Practice  safe sex. Use condoms and avoid high-risk sexual practices to reduce the spread of sexually transmitted infections (STIs). STIs include gonorrhea, chlamydia, syphilis, trichomonas, herpes, HPV, and human immunodeficiency virus (HIV). Herpes, HIV, and HPV are viral illnesses that have no cure. They can result in disability, cancer, and death. Sexually active women aged 25 and younger should be checked for chlamydia. Older women with new or multiple partners should also be tested for chlamydia. Testing for other STIs is recommended if you are sexually active and at increased risk.  Osteoporosis is a disease in which the bones lose minerals and strength with aging. This can result in serious bone fractures. The risk of osteoporosis can be identified using a bone density scan. Women ages 65 and over and women at risk for fractures or osteoporosis should discuss screening with their caregivers. Ask your caregiver whether you should take a calcium supplement or vitamin D to reduce the rate of osteoporosis.  Menopause can be associated with physical symptoms and risks. Hormone replacement therapy is available to decrease symptoms and risks. You should talk to your caregiver about whether hormone replacement therapy is right for you.  Use sunscreen with sun protection factor (SPF) of 30 or more. Apply sunscreen liberally and repeatedly throughout the day. You should seek shade when your shadow is shorter than you. Protect yourself by wearing long sleeves, pants, a wide-brimmed hat, and sunglasses year round, whenever you are outdoors.  Once a month, do a whole body skin exam, using a mirror to look at the skin on your back. Notify your caregiver of new moles, moles that have irregular borders, moles that are larger than a pencil eraser, or moles that have changed in shape or color.  Stay current with required immunizations.  Influenza. You need a dose every fall (or winter). The composition of the flu vaccine  changes each year, so being vaccinated once is not enough.  Pneumococcal polysaccharide. You need 1 to 2 doses if you smoke cigarettes or if you have certain chronic medical conditions. You need 1 dose at age 65 (or older) if you have never been vaccinated.  Tetanus, diphtheria, pertussis (Tdap, Td). Get 1 dose of Tdap vaccine if you are younger than age 65, are over 65 and have contact with an infant, are a healthcare worker, are pregnant, or simply want to be protected from whooping cough. After that, you need a Td   booster dose every 10 years. Consult your caregiver if you have not had at least 3 tetanus and diphtheria-containing shots sometime in your life or have a deep or dirty wound.  HPV. You need this vaccine if you are a woman age 26 or younger. The vaccine is given in 3 doses over 6 months.  Measles, mumps, rubella (MMR). You need at least 1 dose of MMR if you were born in 1957 or later. You may also need a second dose.  Meningococcal. If you are age 19 to 21 and a first-year college student living in a residence hall, or have one of several medical conditions, you need to get vaccinated against meningococcal disease. You may also need additional booster doses.  Zoster (shingles). If you are age 60 or older, you should get this vaccine.  Varicella (chickenpox). If you have never had chickenpox or you were vaccinated but received only 1 dose, talk to your caregiver to find out if you need this vaccine.  Hepatitis A. You need this vaccine if you have a specific risk factor for hepatitis A virus infection or you simply wish to be protected from this disease. The vaccine is usually given as 2 doses, 6 to 18 months apart.  Hepatitis B. You need this vaccine if you have a specific risk factor for hepatitis B virus infection or you simply wish to be protected from this disease. The vaccine is given in 3 doses, usually over 6 months. Preventive Services / Frequency Ages 19 to 39  Blood  pressure check.** / Every 1 to 2 years.  Lipid and cholesterol check.** / Every 5 years beginning at age 20.  Clinical breast exam.** / Every 3 years for women in their 20s and 30s.  Pap test.** / Every 2 years from ages 21 through 29. Every 3 years starting at age 30 through age 65 or 70 with a history of 3 consecutive normal Pap tests.  HPV screening.** / Every 3 years from ages 30 through ages 65 to 70 with a history of 3 consecutive normal Pap tests.  Hepatitis C blood test.** / For any individual with known risks for hepatitis C.  Skin self-exam. / Monthly.  Influenza immunization.** / Every year.  Pneumococcal polysaccharide immunization.** / 1 to 2 doses if you smoke cigarettes or if you have certain chronic medical conditions.  Tetanus, diphtheria, pertussis (Tdap, Td) immunization. / A one-time dose of Tdap vaccine. After that, you need a Td booster dose every 10 years.  HPV immunization. / 3 doses over 6 months, if you are 26 and younger.  Measles, mumps, rubella (MMR) immunization. / You need at least 1 dose of MMR if you were born in 1957 or later. You may also need a second dose.  Meningococcal immunization. / 1 dose if you are age 19 to 21 and a first-year college student living in a residence hall, or have one of several medical conditions, you need to get vaccinated against meningococcal disease. You may also need additional booster doses.  Varicella immunization.** / Consult your caregiver.  Hepatitis A immunization.** / Consult your caregiver. 2 doses, 6 to 18 months apart.  Hepatitis B immunization.** / Consult your caregiver. 3 doses usually over 6 months. Ages 40 to 64  Blood pressure check.** / Every 1 to 2 years.  Lipid and cholesterol check.** / Every 5 years beginning at age 20.  Clinical breast exam.** / Every year after age 40.  Mammogram.** / Every year beginning at age 40   and continuing for as long as you are in good health. Consult with your  caregiver.  Pap test.** / Every 3 years starting at age 30 through age 65 or 70 with a history of 3 consecutive normal Pap tests.  HPV screening.** / Every 3 years from ages 30 through ages 65 to 70 with a history of 3 consecutive normal Pap tests.  Fecal occult blood test (FOBT) of stool. / Every year beginning at age 50 and continuing until age 75. You may not need to do this test if you get a colonoscopy every 10 years.  Flexible sigmoidoscopy or colonoscopy.** / Every 5 years for a flexible sigmoidoscopy or every 10 years for a colonoscopy beginning at age 50 and continuing until age 75.  Hepatitis C blood test.** / For all people born from 1945 through 1965 and any individual with known risks for hepatitis C.  Skin self-exam. / Monthly.  Influenza immunization.** / Every year.  Pneumococcal polysaccharide immunization.** / 1 to 2 doses if you smoke cigarettes or if you have certain chronic medical conditions.  Tetanus, diphtheria, pertussis (Tdap, Td) immunization.** / A one-time dose of Tdap vaccine. After that, you need a Td booster dose every 10 years.  Measles, mumps, rubella (MMR) immunization. / You need at least 1 dose of MMR if you were born in 1957 or later. You may also need a second dose.  Varicella immunization.** / Consult your caregiver.  Meningococcal immunization.** / Consult your caregiver.  Hepatitis A immunization.** / Consult your caregiver. 2 doses, 6 to 18 months apart.  Hepatitis B immunization.** / Consult your caregiver. 3 doses, usually over 6 months. Ages 65 and over  Blood pressure check.** / Every 1 to 2 years.  Lipid and cholesterol check.** / Every 5 years beginning at age 20.  Clinical breast exam.** / Every year after age 40.  Mammogram.** / Every year beginning at age 40 and continuing for as long as you are in good health. Consult with your caregiver.  Pap test.** / Every 3 years starting at age 30 through age 65 or 70 with a 3  consecutive normal Pap tests. Testing can be stopped between 65 and 70 with 3 consecutive normal Pap tests and no abnormal Pap or HPV tests in the past 10 years.  HPV screening.** / Every 3 years from ages 30 through ages 65 or 70 with a history of 3 consecutive normal Pap tests. Testing can be stopped between 65 and 70 with 3 consecutive normal Pap tests and no abnormal Pap or HPV tests in the past 10 years.  Fecal occult blood test (FOBT) of stool. / Every year beginning at age 50 and continuing until age 75. You may not need to do this test if you get a colonoscopy every 10 years.  Flexible sigmoidoscopy or colonoscopy.** / Every 5 years for a flexible sigmoidoscopy or every 10 years for a colonoscopy beginning at age 50 and continuing until age 75.  Hepatitis C blood test.** / For all people born from 1945 through 1965 and any individual with known risks for hepatitis C.  Osteoporosis screening.** / A one-time screening for women ages 65 and over and women at risk for fractures or osteoporosis.  Skin self-exam. / Monthly.  Influenza immunization.** / Every year.  Pneumococcal polysaccharide immunization.** / 1 dose at age 65 (or older) if you have never been vaccinated.  Tetanus, diphtheria, pertussis (Tdap, Td) immunization. / A one-time dose of Tdap vaccine if you are over   65 and have contact with an infant, are a healthcare worker, or simply want to be protected from whooping cough. After that, you need a Td booster dose every 10 years.  Varicella immunization.** / Consult your caregiver.  Meningococcal immunization.** / Consult your caregiver.  Hepatitis A immunization.** / Consult your caregiver. 2 doses, 6 to 18 months apart.  Hepatitis B immunization.** / Check with your caregiver. 3 doses, usually over 6 months. ** Family history and personal history of risk and conditions may change your caregiver's recommendations. Document Released: 06/03/2001 Document Revised: 06/30/2011  Document Reviewed: 09/02/2010 [employer] Patient Information 2014 [employer]  

## 2013-01-07 NOTE — Assessment & Plan Note (Signed)
Labs reviewed pravachol started today

## 2013-01-24 ENCOUNTER — Ambulatory Visit (INDEPENDENT_AMBULATORY_CARE_PROVIDER_SITE_OTHER): Payer: 59 | Admitting: Family Medicine

## 2013-01-24 ENCOUNTER — Encounter: Payer: Self-pay | Admitting: Family Medicine

## 2013-01-24 VITALS — BP 122/80 | HR 66 | Temp 98.4°F | Wt 254.6 lb

## 2013-01-24 DIAGNOSIS — H6692 Otitis media, unspecified, left ear: Secondary | ICD-10-CM

## 2013-01-24 DIAGNOSIS — H6690 Otitis media, unspecified, unspecified ear: Secondary | ICD-10-CM

## 2013-01-24 DIAGNOSIS — J019 Acute sinusitis, unspecified: Secondary | ICD-10-CM

## 2013-01-24 MED ORDER — AMOXICILLIN-POT CLAVULANATE 875-125 MG PO TABS: 1.0000 | ORAL_TABLET | Freq: Two times a day (BID) | ORAL | Status: DC

## 2013-01-24 NOTE — Progress Notes (Signed)
  Subjective:     Heather [name] is a 52 y.o. female who presents for evaluation of sinus pain. Symptoms include: congestion, cough, facial pain, headaches, nasal congestion, purulent rhinorrhea and sinus pressure. Onset of symptoms was 1 week ago. Symptoms have been gradually worsening since that time. Past history is significant for no history of pneumonia or bronchitis. Patient is a non-smoker.  The following portions of the patient's history were reviewed and updated as appropriate: allergies, current medications, past family history, past medical history, past social history, past surgical history and problem list.  Review of Systems Pertinent items are noted in HPI.   Objective:    BP 122/80  Pulse 66  Temp(Src) 98.4 F (36.9 C) (Oral)  Wt 254 lb 9.6 oz (115.486 kg)  BMI 41.41 kg/m2  SpO2 98% General appearance: alert, cooperative, appears stated age and no distress Ears: abnormal TM left ear - erythematous and retracted Nose: green discharge, moderate congestion, turbinates red, swollen, sinus tenderness bilateral Throat: lips, mucosa, and tongue normal; teeth and gums normal Neck: mild anterior cervical adenopathy, supple, symmetrical, trachea midline and thyroid not enlarged, symmetric, no tenderness/mass/nodules Lungs: clear to auscultation bilaterally Heart: S1, S2 normal    Assessment:    Acute bacterial sinusitis.    Plan:    Nasal steroids per medication orders. Antihistamines per medication orders. Augmentin per medication orders.

## 2013-01-24 NOTE — Patient Instructions (Signed)

## 2013-02-24 ENCOUNTER — Other Ambulatory Visit: Payer: Self-pay

## 2013-03-31 ENCOUNTER — Encounter: Payer: Self-pay | Admitting: Family Medicine

## 2013-07-15 LAB — HM MAMMOGRAPHY

## 2013-12-09 ENCOUNTER — Ambulatory Visit (INDEPENDENT_AMBULATORY_CARE_PROVIDER_SITE_OTHER): Payer: 59 | Admitting: Medical

## 2013-12-09 VITALS — BP 136/81 | HR 52 | Temp 98.4°F | Wt 230.2 lb

## 2013-12-09 DIAGNOSIS — H6690 Otitis media, unspecified, unspecified ear: Secondary | ICD-10-CM | POA: Insufficient documentation

## 2013-12-09 DIAGNOSIS — J309 Allergic rhinitis, unspecified: Secondary | ICD-10-CM

## 2013-12-09 DIAGNOSIS — H6692 Otitis media, unspecified, left ear: Secondary | ICD-10-CM

## 2013-12-09 MED ORDER — FLUTICASONE PROPIONATE 50 MCG/ACT NA SUSP: 2.0000 | Freq: Every day | NASAL | Status: DC

## 2013-12-09 MED ORDER — CEFDINIR 300 MG PO CAPS: 300.0000 mg | ORAL_CAPSULE | Freq: Two times a day (BID) | ORAL | Status: DC

## 2013-12-09 MED ORDER — FLUCONAZOLE 150 MG PO TABS: 150.0000 mg | ORAL_TABLET | Freq: Once | ORAL | Status: DC

## 2013-12-09 NOTE — Patient Instructions (Signed)
You appear to have some allergic rhinitis symptoms with congestion that preceded lt OM(early/mild). I am prescribing fluticasone nasal spray for congestion and cefdinir for otttis media. I also will prescribe diflucan tab in event of yeast infection. You could use afrin before your flight tomorrow. Please remember no more than 2 days use of afrin. Follow up 7 days or as needed.

## 2013-12-09 NOTE — Progress Notes (Signed)
Pre visit review using our clinic review tool, if applicable. No additional management support is needed unless otherwise documented below in the visit note. 

## 2013-12-09 NOTE — Assessment & Plan Note (Signed)
Cefdinir rx.  

## 2013-12-09 NOTE — Assessment & Plan Note (Signed)
Allergic rhinitis this summer. Rx fluticasone.

## 2013-12-09 NOTE — Progress Notes (Signed)
   Subjective:    Patient ID: Heather [name], female    DOB: 04-23-1960, 53 y.o.   MRN: [id number]  HPI  Pt in with lt ear pain for about 1 month. Pt reports some occasional chills but she states hot flash related. 1 yr ago she had om lt side. Pt was not asymptomatic in the past when had that infection. Pt has nasal congestion and runny all summer Some pnd. Runny nose. Ear pain mild but gradually getting worse. She is about to fly to [place] tomorrow morning.    Review of Systems  Constitutional: Negative for fever and fatigue.       Pt did not report fatigue to me.  HENT: Positive for congestion, postnasal drip and rhinorrhea. Negative for sneezing and trouble swallowing.   Eyes: Negative for itching.  Respiratory: Negative for choking, chest tightness, shortness of breath and wheezing.   Cardiovascular: Negative for chest pain and palpitations.  Musculoskeletal: Negative.   Neurological: Negative for dizziness, syncope, facial asymmetry, weakness, light-headedness and headaches.  Hematological: Negative for adenopathy. Does not bruise/bleed easily.       Objective:   Physical Exam  .General  Mental Status - Alert. General Appearance - Well groomed. Not in acute distress.  Skin Rashes- No Rashes.  HEENT Head- Normal. Ear Auditory Canal - Left- Normal. Right - Normal.Tympanic Membrane- Left- mild pinkish red. Right- Normal. Eye Sclera/Conjunctiva- Left- Normal. Right- Normal. Nose & Sinuses Nasal Mucosa- Left-  Boggy + Congested. Right-  Boggy + Congested. Faint ethmoid sinus region tender Mouth & Throat Lips: Upper Lip- Normal: no dryness, cracking, pallor, cyanosis, or vesicular eruption. Lower Lip-Normal: no dryness, cracking, pallor, cyanosis or vesicular eruption. Buccal Mucosa- Bilateral- No Aphthous ulcers. Oropharynx- No Discharge or Erythema. Tonsils: Characteristics- Bilateral- No Erythema or Congestion. pnd. Size/Enlargement- Bilateral- No enlargement.  Discharge- bilateral-None.  Neck Neck- Supple. No Masses.   Chest and Lung Exam Auscultation: Breath Sounds:-Normal. Clear even and unlabored.  Cardiovascular Auscultation:Rythm- Regular.  Murmurs & Other Heart Sounds:Ausculatation of the heart reveal- No Murmurs.  Lymphatic Head & Neck General Head & Neck Lymphatics: Bilateral: Description- No Localized lymphadenopathy.        Assessment & Plan:

## 2014-01-04 ENCOUNTER — Encounter: Payer: Self-pay | Admitting: Gastroenterology

## 2014-01-27 ENCOUNTER — Telehealth: Payer: Self-pay | Admitting: Family Medicine

## 2014-01-27 DIAGNOSIS — I10 Essential (primary) hypertension: Secondary | ICD-10-CM

## 2014-01-27 DIAGNOSIS — E785 Hyperlipidemia, unspecified: Secondary | ICD-10-CM

## 2014-01-27 NOTE — Telephone Encounter (Signed)
Caller name: Joyelle  Relation to pt: self  Call back number: [phone]   Reason for call: pt scheduled physical apartment for 02/17/14 at 3:30pm pt would like to come in at 8:15am for lab work. Requesting orders.

## 2014-01-30 ENCOUNTER — Other Ambulatory Visit: Payer: 59

## 2014-01-30 NOTE — Telephone Encounter (Signed)
OK to order lipid, renal, cbc, tsh, hepatic prior to visit for HTN and hyperlipid

## 2014-02-06 ENCOUNTER — Other Ambulatory Visit: Payer: Self-pay

## 2014-02-06 MED ORDER — LISINOPRIL 10 MG PO TABS: ORAL_TABLET | ORAL | Status: DC

## 2014-02-06 MED ORDER — FUROSEMIDE 20 MG PO TABS: ORAL_TABLET | ORAL | Status: DC

## 2014-02-17 ENCOUNTER — Other Ambulatory Visit: Payer: 59

## 2014-02-17 ENCOUNTER — Encounter: Payer: 59 | Admitting: Family Medicine

## 2014-03-09 ENCOUNTER — Ambulatory Visit (INDEPENDENT_AMBULATORY_CARE_PROVIDER_SITE_OTHER): Payer: 59 | Admitting: Family Medicine

## 2014-03-09 ENCOUNTER — Encounter: Payer: Self-pay | Admitting: Family Medicine

## 2014-03-09 VITALS — BP 112/70 | HR 61 | Temp 97.8°F | Ht 65.75 in | Wt 230.8 lb

## 2014-03-09 DIAGNOSIS — Z0000 Encounter for general adult medical examination without abnormal findings: Secondary | ICD-10-CM

## 2014-03-09 DIAGNOSIS — E785 Hyperlipidemia, unspecified: Secondary | ICD-10-CM

## 2014-03-09 DIAGNOSIS — R51 Headache: Secondary | ICD-10-CM

## 2014-03-09 DIAGNOSIS — R739 Hyperglycemia, unspecified: Secondary | ICD-10-CM

## 2014-03-09 DIAGNOSIS — I2510 Atherosclerotic heart disease of native coronary artery without angina pectoris: Secondary | ICD-10-CM | POA: Insufficient documentation

## 2014-03-09 DIAGNOSIS — R519 Headache, unspecified: Secondary | ICD-10-CM

## 2014-03-09 DIAGNOSIS — I10 Essential (primary) hypertension: Secondary | ICD-10-CM

## 2014-03-09 LAB — CBC WITH DIFFERENTIAL/PLATELET
Basophils Absolute: 0 10*3/uL (ref 0.0–0.1)
Basophils Relative: 0.7 % (ref 0.0–3.0)
EOS ABS: 0.1 10*3/uL (ref 0.0–0.7)
Eosinophils Relative: 1.3 % (ref 0.0–5.0)
HCT: 40.5 % (ref 36.0–46.0)
Hemoglobin: 12.9 g/dL (ref 12.0–15.0)
LYMPHS PCT: 50.6 % — AB (ref 12.0–46.0)
Lymphs Abs: 2.8 10*3/uL (ref 0.7–4.0)
MCHC: 31.9 g/dL (ref 30.0–36.0)
MCV: 78.3 fl (ref 78.0–100.0)
Monocytes Absolute: 0.3 10*3/uL (ref 0.1–1.0)
Monocytes Relative: 6.1 % (ref 3.0–12.0)
NEUTROS PCT: 41.3 % — AB (ref 43.0–77.0)
Neutro Abs: 2.3 10*3/uL (ref 1.4–7.7)
Platelets: 188 10*3/uL (ref 150.0–400.0)
RBC: 5.18 Mil/uL — ABNORMAL HIGH (ref 3.87–5.11)
RDW: 15.4 % (ref 11.5–15.5)
WBC: 5.6 10*3/uL (ref 4.0–10.5)

## 2014-03-09 LAB — LIPID PANEL
CHOL/HDL RATIO: 4
Cholesterol: 244 mg/dL — ABNORMAL HIGH (ref 0–200)
HDL: 66.9 mg/dL (ref >39.00)
LDL CALC: 163 mg/dL — AB (ref 0–99)
NONHDL: 177.1
Triglycerides: 70 mg/dL (ref 0.0–149.0)
VLDL: 14 mg/dL (ref 0.0–40.0)

## 2014-03-09 LAB — HEPATIC FUNCTION PANEL
ALBUMIN: 4.3 g/dL (ref 3.5–5.2)
ALK PHOS: 54 U/L (ref 39–117)
ALT: 15 U/L (ref 0–35)
AST: 19 U/L (ref 0–37)
BILIRUBIN DIRECT: 0 mg/dL (ref 0.0–0.3)
BILIRUBIN TOTAL: 0.4 mg/dL (ref 0.2–1.2)
Total Protein: 7.4 g/dL (ref 6.0–8.3)

## 2014-03-09 LAB — BASIC METABOLIC PANEL
BUN: 17 mg/dL (ref 6–23)
CALCIUM: 10 mg/dL (ref 8.4–10.5)
CO2: 31 meq/L (ref 19–32)
CREATININE: 0.8 mg/dL (ref 0.4–1.2)
Chloride: 102 mEq/L (ref 96–112)
GFR: 98.73 mL/min (ref >60.00)
GLUCOSE: 103 mg/dL — AB (ref 70–99)
Potassium: 4.9 mEq/L (ref 3.5–5.1)
Sodium: 141 mEq/L (ref 135–145)

## 2014-03-09 LAB — POCT URINALYSIS DIPSTICK
BILIRUBIN UA: NEGATIVE
Glucose, UA: NEGATIVE
Ketones, UA: NEGATIVE
Leukocytes, UA: NEGATIVE
Nitrite, UA: NEGATIVE
PROTEIN UA: NEGATIVE
RBC UA: NEGATIVE
Spec Grav, UA: 1.02
Urobilinogen, UA: 0.2
pH, UA: 6

## 2014-03-09 LAB — TSH: TSH: 0.95 u[IU]/mL (ref 0.35–4.50)

## 2014-03-09 LAB — HEMOGLOBIN A1C: Hgb A1c MFr Bld: 6.1 % (ref 4.6–6.5)

## 2014-03-09 MED ORDER — SUMATRIPTAN SUCCINATE 50 MG PO TABS: 50.0000 mg | ORAL_TABLET | ORAL | Status: DC | PRN

## 2014-03-09 NOTE — Patient Instructions (Signed)
Preventive Care for Adults A healthy lifestyle and preventive care can promote health and wellness. Preventive health guidelines for women include the following key practices.  A routine yearly physical is a good way to check with your health care provider about your health and preventive screening. It is a chance to share any concerns and updates on your health and to receive a thorough exam.  Visit your dentist for a routine exam and preventive care every 6 months. Brush your teeth twice a day and floss once a day. Good oral hygiene prevents tooth decay and gum disease.  The frequency of eye exams is based on your age, health, family medical history, use of contact lenses, and other factors. Follow your health care provider's recommendations for frequency of eye exams.  Eat a healthy diet. Foods like vegetables, fruits, whole grains, low-fat dairy products, and lean protein foods contain the nutrients you need without too many calories. Decrease your intake of foods high in solid fats, added sugars, and salt. Eat the right amount of calories for you.Get information about a proper diet from your health care provider, if necessary.  Regular physical exercise is one of the most important things you can do for your health. Most adults should get at least 150 minutes of moderate-intensity exercise (any activity that increases your heart rate and causes you to sweat) each week. In addition, most adults need muscle-strengthening exercises on 2 or more days a week.  Maintain a healthy weight. The body mass index (BMI) is a screening tool to identify possible weight problems. It provides an estimate of body fat based on height and weight. Your health care provider can find your BMI and can help you achieve or maintain a healthy weight.For adults 20 years and older:  A BMI below 18.5 is considered underweight.  A BMI of 18.5 to 24.9 is normal.  A BMI of 25 to 29.9 is considered overweight.  A BMI of  30 and above is considered obese.  Maintain normal blood lipids and cholesterol levels by exercising and minimizing your intake of saturated fat. Eat a balanced diet with plenty of fruit and vegetables. Blood tests for lipids and cholesterol should begin at age 76 and be repeated every 5 years. If your lipid or cholesterol levels are high, you are over 50, or you are at high risk for heart disease, you may need your cholesterol levels checked more frequently.Ongoing high lipid and cholesterol levels should be treated with medicines if diet and exercise are not working.  If you smoke, find out from your health care provider how to quit. If you do not use tobacco, do not start.  Lung cancer screening is recommended for adults aged 22-80 years who are at high risk for developing lung cancer because of a history of smoking. A yearly low-dose CT scan of the lungs is recommended for people who have at least a 30-pack-year history of smoking and are a current smoker or have quit within the past 15 years. A pack year of smoking is smoking an average of 1 pack of cigarettes a day for 1 year (for example: 1 pack a day for 30 years or 2 packs a day for 15 years). Yearly screening should continue until the smoker has stopped smoking for at least 15 years. Yearly screening should be stopped for people who develop a health problem that would prevent them from having lung cancer treatment.  If you are pregnant, do not drink alcohol. If you are breastfeeding,  be very cautious about drinking alcohol. If you are not pregnant and choose to drink alcohol, do not have more than 1 drink per day. One drink is considered to be 12 ounces (355 mL) of beer, 5 ounces (148 mL) of wine, or 1.5 ounces (44 mL) of liquor.  Avoid use of street drugs. Do not share needles with anyone. Ask for help if you need support or instructions about stopping the use of drugs.  High blood pressure causes heart disease and increases the risk of  stroke. Your blood pressure should be checked at least every 1 to 2 years. Ongoing high blood pressure should be treated with medicines if weight loss and exercise do not work.  If you are 75-52 years old, ask your health care provider if you should take aspirin to prevent strokes.  Diabetes screening involves taking a blood sample to check your fasting blood sugar level. This should be done once every 3 years, after age 15, if you are within normal weight and without risk factors for diabetes. Testing should be considered at a younger age or be carried out more frequently if you are overweight and have at least 1 risk factor for diabetes.  Breast cancer screening is essential preventive care for women. You should practice "breast self-awareness." This means understanding the normal appearance and feel of your breasts and may include breast self-examination. Any changes detected, no matter how small, should be reported to a health care provider. Women in their 58s and 30s should have a clinical breast exam (CBE) by a health care provider as part of a regular health exam every 1 to 3 years. After age 16, women should have a CBE every year. Starting at age 53, women should consider having a mammogram (breast X-ray test) every year. Women who have a family history of breast cancer should talk to their health care provider about genetic screening. Women at a high risk of breast cancer should talk to their health care providers about having an MRI and a mammogram every year.  Breast cancer gene (BRCA)-related cancer risk assessment is recommended for women who have family members with BRCA-related cancers. BRCA-related cancers include breast, ovarian, tubal, and peritoneal cancers. Having family members with these cancers may be associated with an increased risk for harmful changes (mutations) in the breast cancer genes BRCA1 and BRCA2. Results of the assessment will determine the need for genetic counseling and  BRCA1 and BRCA2 testing.  Routine pelvic exams to screen for cancer are no longer recommended for nonpregnant women who are considered low risk for cancer of the pelvic organs (ovaries, uterus, and vagina) and who do not have symptoms. Ask your health care provider if a screening pelvic exam is right for you.  If you have had past treatment for cervical cancer or a condition that could lead to cancer, you need Pap tests and screening for cancer for at least 20 years after your treatment. If Pap tests have been discontinued, your risk factors (such as having a new sexual partner) need to be reassessed to determine if screening should be resumed. Some women have medical problems that increase the chance of getting cervical cancer. In these cases, your health care provider may recommend more frequent screening and Pap tests.  The HPV test is an additional test that may be used for cervical cancer screening. The HPV test looks for the virus that can cause the cell changes on the cervix. The cells collected during the Pap test can be  tested for HPV. The HPV test could be used to screen women aged 30 years and older, and should be used in women of any age who have unclear Pap test results. After the age of 30, women should have HPV testing at the same frequency as a Pap test.  Colorectal cancer can be detected and often prevented. Most routine colorectal cancer screening begins at the age of 50 years and continues through age 75 years. However, your health care provider may recommend screening at an earlier age if you have risk factors for colon cancer. On a yearly basis, your health care provider may provide home test kits to check for hidden blood in the stool. Use of a small camera at the end of a tube, to directly examine the colon (sigmoidoscopy or colonoscopy), can detect the earliest forms of colorectal cancer. Talk to your health care provider about this at age 50, when routine screening begins. Direct  exam of the colon should be repeated every 5-10 years through age 75 years, unless early forms of pre-cancerous polyps or small growths are found.  People who are at an increased risk for hepatitis B should be screened for this virus. You are considered at high risk for hepatitis B if:  You were born in a country where hepatitis B occurs often. Talk with your health care provider about which countries are considered high risk.  Your parents were born in a high-risk country and you have not received a shot to protect against hepatitis B (hepatitis B vaccine).  You have HIV or AIDS.  You use needles to inject street drugs.  You live with, or have sex with, someone who has hepatitis B.  You get hemodialysis treatment.  You take certain medicines for conditions like cancer, organ transplantation, and autoimmune conditions.  Hepatitis C blood testing is recommended for all people born from 1945 through 1965 and any individual with known risks for hepatitis C.  Practice safe sex. Use condoms and avoid high-risk sexual practices to reduce the spread of sexually transmitted infections (STIs). STIs include gonorrhea, chlamydia, syphilis, trichomonas, herpes, HPV, and human immunodeficiency virus (HIV). Herpes, HIV, and HPV are viral illnesses that have no cure. They can result in disability, cancer, and death.  You should be screened for sexually transmitted illnesses (STIs) including gonorrhea and chlamydia if:  You are sexually active and are younger than 24 years.  You are older than 24 years and your health care provider tells you that you are at risk for this type of infection.  Your sexual activity has changed since you were last screened and you are at an increased risk for chlamydia or gonorrhea. Ask your health care provider if you are at risk.  If you are at risk of being infected with HIV, it is recommended that you take a prescription medicine daily to prevent HIV infection. This is  called preexposure prophylaxis (PrEP). You are considered at risk if:  You are a heterosexual woman, are sexually active, and are at increased risk for HIV infection.  You take drugs by injection.  You are sexually active with a partner who has HIV.  Talk with your health care provider about whether you are at high risk of being infected with HIV. If you choose to begin PrEP, you should first be tested for HIV. You should then be tested every 3 months for as long as you are taking PrEP.  Osteoporosis is a disease in which the bones lose minerals and strength   with aging. This can result in serious bone fractures or breaks. The risk of osteoporosis can be identified using a bone density scan. Women ages 65 years and over and women at risk for fractures or osteoporosis should discuss screening with their health care providers. Ask your health care provider whether you should take a calcium supplement or vitamin D to reduce the rate of osteoporosis.  Menopause can be associated with physical symptoms and risks. Hormone replacement therapy is available to decrease symptoms and risks. You should talk to your health care provider about whether hormone replacement therapy is right for you.  Use sunscreen. Apply sunscreen liberally and repeatedly throughout the day. You should seek shade when your shadow is shorter than you. Protect yourself by wearing long sleeves, pants, a wide-brimmed hat, and sunglasses year round, whenever you are outdoors.  Once a month, do a whole body skin exam, using a mirror to look at the skin on your back. Tell your health care provider of new moles, moles that have irregular borders, moles that are larger than a pencil eraser, or moles that have changed in shape or color.  Stay current with required vaccines (immunizations).  Influenza vaccine. All adults should be immunized every year.  Tetanus, diphtheria, and acellular pertussis (Td, Tdap) vaccine. Pregnant women should  receive 1 dose of Tdap vaccine during each pregnancy. The dose should be obtained regardless of the length of time since the last dose. Immunization is preferred during the 27th-36th week of gestation. An adult who has not previously received Tdap or who does not know her vaccine status should receive 1 dose of Tdap. This initial dose should be followed by tetanus and diphtheria toxoids (Td) booster doses every 10 years. Adults with an unknown or incomplete history of completing a 3-dose immunization series with Td-containing vaccines should begin or complete a primary immunization series including a Tdap dose. Adults should receive a Td booster every 10 years.  Varicella vaccine. An adult without evidence of immunity to varicella should receive 2 doses or a second dose if she has previously received 1 dose. Pregnant females who do not have evidence of immunity should receive the first dose after pregnancy. This first dose should be obtained before leaving the health care facility. The second dose should be obtained 4-8 weeks after the first dose.  Human papillomavirus (HPV) vaccine. Females aged 13-26 years who have not received the vaccine previously should obtain the 3-dose series. The vaccine is not recommended for use in pregnant females. However, pregnancy testing is not needed before receiving a dose. If a female is found to be pregnant after receiving a dose, no treatment is needed. In that case, the remaining doses should be delayed until after the pregnancy. Immunization is recommended for any person with an immunocompromised condition through the age of 26 years if she did not get any or all doses earlier. During the 3-dose series, the second dose should be obtained 4-8 weeks after the first dose. The third dose should be obtained 24 weeks after the first dose and 16 weeks after the second dose.  Zoster vaccine. One dose is recommended for adults aged 60 years or older unless certain conditions are  present.  Measles, mumps, and rubella (MMR) vaccine. Adults born before 1957 generally are considered immune to measles and mumps. Adults born in 1957 or later should have 1 or more doses of MMR vaccine unless there is a contraindication to the vaccine or there is laboratory evidence of immunity to   each of the three diseases. A routine second dose of MMR vaccine should be obtained at least 28 days after the first dose for students attending postsecondary schools, health care workers, or international travelers. People who received inactivated measles vaccine or an unknown type of measles vaccine during 1963-1967 should receive 2 doses of MMR vaccine. People who received inactivated mumps vaccine or an unknown type of mumps vaccine before 1979 and are at high risk for mumps infection should consider immunization with 2 doses of MMR vaccine. For females of childbearing age, rubella immunity should be determined. If there is no evidence of immunity, females who are not pregnant should be vaccinated. If there is no evidence of immunity, females who are pregnant should delay immunization until after pregnancy. Unvaccinated health care workers born before 1957 who lack laboratory evidence of measles, mumps, or rubella immunity or laboratory confirmation of disease should consider measles and mumps immunization with 2 doses of MMR vaccine or rubella immunization with 1 dose of MMR vaccine.  Pneumococcal 13-valent conjugate (PCV13) vaccine. When indicated, a person who is uncertain of her immunization history and has no record of immunization should receive the PCV13 vaccine. An adult aged 19 years or older who has certain medical conditions and has not been previously immunized should receive 1 dose of PCV13 vaccine. This PCV13 should be followed with a dose of pneumococcal polysaccharide (PPSV23) vaccine. The PPSV23 vaccine dose should be obtained at least 8 weeks after the dose of PCV13 vaccine. An adult aged 19  years or older who has certain medical conditions and previously received 1 or more doses of PPSV23 vaccine should receive 1 dose of PCV13. The PCV13 vaccine dose should be obtained 1 or more years after the last PPSV23 vaccine dose.  Pneumococcal polysaccharide (PPSV23) vaccine. When PCV13 is also indicated, PCV13 should be obtained first. All adults aged 65 years and older should be immunized. An adult younger than age 65 years who has certain medical conditions should be immunized. Any person who resides in a nursing home or long-term care facility should be immunized. An adult smoker should be immunized. People with an immunocompromised condition and certain other conditions should receive both PCV13 and PPSV23 vaccines. People with human immunodeficiency virus (HIV) infection should be immunized as soon as possible after diagnosis. Immunization during chemotherapy or radiation therapy should be avoided. Routine use of PPSV23 vaccine is not recommended for American Indians, [place] Natives, or people younger than 65 years unless there are medical conditions that require PPSV23 vaccine. When indicated, people who have unknown immunization and have no record of immunization should receive PPSV23 vaccine. One-time revaccination 5 years after the first dose of PPSV23 is recommended for people aged 19-64 years who have chronic kidney failure, nephrotic syndrome, asplenia, or immunocompromised conditions. People who received 1-2 doses of PPSV23 before age 65 years should receive another dose of PPSV23 vaccine at age 65 years or later if at least 5 years have passed since the previous dose. Doses of PPSV23 are not needed for people immunized with PPSV23 at or after age 65 years.  Meningococcal vaccine. Adults with asplenia or persistent complement component deficiencies should receive 2 doses of quadrivalent meningococcal conjugate (MenACWY-D) vaccine. The doses should be obtained at least 2 months apart.  Microbiologists working with certain meningococcal bacteria, military recruits, people at risk during an outbreak, and people who travel to or live in countries with a high rate of meningitis should be immunized. A first-year college student up through age   21 years who is living in a residence hall should receive a dose if she did not receive a dose on or after her 16th birthday. Adults who have certain high-risk conditions should receive one or more doses of vaccine.  Hepatitis A vaccine. Adults who wish to be protected from this disease, have certain high-risk conditions, work with hepatitis A-infected animals, work in hepatitis A research labs, or travel to or work in countries with a high rate of hepatitis A should be immunized. Adults who were previously unvaccinated and who anticipate close contact with an international adoptee during the first 60 days after arrival in the [place] from a country with a high rate of hepatitis A should be immunized.  Hepatitis B vaccine. Adults who wish to be protected from this disease, have certain high-risk conditions, may be exposed to blood or other infectious body fluids, are household contacts or sex partners of hepatitis B positive people, are clients or workers in certain care facilities, or travel to or work in countries with a high rate of hepatitis B should be immunized.  Haemophilus influenzae type b (Hib) vaccine. A previously unvaccinated person with asplenia or sickle cell disease or having a scheduled splenectomy should receive 1 dose of Hib vaccine. Regardless of previous immunization, a recipient of a hematopoietic stem cell transplant should receive a 3-dose series 6-12 months after her successful transplant. Hib vaccine is not recommended for adults with HIV infection. Preventive Services / Frequency Ages 64 to 68 years  Blood pressure check.** / Every 1 to 2 years.  Lipid and cholesterol check.** / Every 5 years beginning at age  22.  Clinical breast exam.** / Every 3 years for women in their 88s and 53s.  BRCA-related cancer risk assessment.** / For women who have family members with a BRCA-related cancer (breast, ovarian, tubal, or peritoneal cancers).  Pap test.** / Every 2 years from ages 90 through 51. Every 3 years starting at age 21 through age 56 or 3 with a history of 3 consecutive normal Pap tests.  HPV screening.** / Every 3 years from ages 24 through ages 1 to 46 with a history of 3 consecutive normal Pap tests.  Hepatitis C blood test.** / For any individual with known risks for hepatitis C.  Skin self-exam. / Monthly.  Influenza vaccine. / Every year.  Tetanus, diphtheria, and acellular pertussis (Tdap, Td) vaccine.** / Consult your health care provider. Pregnant women should receive 1 dose of Tdap vaccine during each pregnancy. 1 dose of Td every 10 years.  Varicella vaccine.** / Consult your health care provider. Pregnant females who do not have evidence of immunity should receive the first dose after pregnancy.  HPV vaccine. / 3 doses over 6 months, if 72 and younger. The vaccine is not recommended for use in pregnant females. However, pregnancy testing is not needed before receiving a dose.  Measles, mumps, rubella (MMR) vaccine.** / You need at least 1 dose of MMR if you were born in 1957 or later. You may also need a 2nd dose. For females of childbearing age, rubella immunity should be determined. If there is no evidence of immunity, females who are not pregnant should be vaccinated. If there is no evidence of immunity, females who are pregnant should delay immunization until after pregnancy.  Pneumococcal 13-valent conjugate (PCV13) vaccine.** / Consult your health care provider.  Pneumococcal polysaccharide (PPSV23) vaccine.** / 1 to 2 doses if you smoke cigarettes or if you have certain conditions.  Meningococcal vaccine.** /  1 dose if you are age 19 to 21 years and a first-year college  student living in a residence hall, or have one of several medical conditions, you need to get vaccinated against meningococcal disease. You may also need additional booster doses.  Hepatitis A vaccine.** / Consult your health care provider.  Hepatitis B vaccine.** / Consult your health care provider.  Haemophilus influenzae type b (Hib) vaccine.** / Consult your health care provider. Ages 40 to 64 years  Blood pressure check.** / Every 1 to 2 years.  Lipid and cholesterol check.** / Every 5 years beginning at age 20 years.  Lung cancer screening. / Every year if you are aged 55-80 years and have a 30-pack-year history of smoking and currently smoke or have quit within the past 15 years. Yearly screening is stopped once you have quit smoking for at least 15 years or develop a health problem that would prevent you from having lung cancer treatment.  Clinical breast exam.** / Every year after age 40 years.  BRCA-related cancer risk assessment.** / For women who have family members with a BRCA-related cancer (breast, ovarian, tubal, or peritoneal cancers).  Mammogram.** / Every year beginning at age 40 years and continuing for as long as you are in good health. Consult with your health care provider.  Pap test.** / Every 3 years starting at age 30 years through age 65 or 70 years with a history of 3 consecutive normal Pap tests.  HPV screening.** / Every 3 years from ages 30 years through ages 65 to 70 years with a history of 3 consecutive normal Pap tests.  Fecal occult blood test (FOBT) of stool. / Every year beginning at age 50 years and continuing until age 75 years. You may not need to do this test if you get a colonoscopy every 10 years.  Flexible sigmoidoscopy or colonoscopy.** / Every 5 years for a flexible sigmoidoscopy or every 10 years for a colonoscopy beginning at age 50 years and continuing until age 75 years.  Hepatitis C blood test.** / For all people born from 1945 through  1965 and any individual with known risks for hepatitis C.  Skin self-exam. / Monthly.  Influenza vaccine. / Every year.  Tetanus, diphtheria, and acellular pertussis (Tdap/Td) vaccine.** / Consult your health care provider. Pregnant women should receive 1 dose of Tdap vaccine during each pregnancy. 1 dose of Td every 10 years.  Varicella vaccine.** / Consult your health care provider. Pregnant females who do not have evidence of immunity should receive the first dose after pregnancy.  Zoster vaccine.** / 1 dose for adults aged 60 years or older.  Measles, mumps, rubella (MMR) vaccine.** / You need at least 1 dose of MMR if you were born in 1957 or later. You may also need a 2nd dose. For females of childbearing age, rubella immunity should be determined. If there is no evidence of immunity, females who are not pregnant should be vaccinated. If there is no evidence of immunity, females who are pregnant should delay immunization until after pregnancy.  Pneumococcal 13-valent conjugate (PCV13) vaccine.** / Consult your health care provider.  Pneumococcal polysaccharide (PPSV23) vaccine.** / 1 to 2 doses if you smoke cigarettes or if you have certain conditions.  Meningococcal vaccine.** / Consult your health care provider.  Hepatitis A vaccine.** / Consult your health care provider.  Hepatitis B vaccine.** / Consult your health care provider.  Haemophilus influenzae type b (Hib) vaccine.** / Consult your health care provider. Ages 65   years and over  Blood pressure check.** / Every 1 to 2 years.  Lipid and cholesterol check.** / Every 5 years beginning at age 22 years.  Lung cancer screening. / Every year if you are aged 73-80 years and have a 30-pack-year history of smoking and currently smoke or have quit within the past 15 years. Yearly screening is stopped once you have quit smoking for at least 15 years or develop a health problem that would prevent you from having lung cancer  treatment.  Clinical breast exam.** / Every year after age 4 years.  BRCA-related cancer risk assessment.** / For women who have family members with a BRCA-related cancer (breast, ovarian, tubal, or peritoneal cancers).  Mammogram.** / Every year beginning at age 40 years and continuing for as long as you are in good health. Consult with your health care provider.  Pap test.** / Every 3 years starting at age 9 years through age 34 or 91 years with 3 consecutive normal Pap tests. Testing can be stopped between 65 and 70 years with 3 consecutive normal Pap tests and no abnormal Pap or HPV tests in the past 10 years.  HPV screening.** / Every 3 years from ages 57 years through ages 64 or 45 years with a history of 3 consecutive normal Pap tests. Testing can be stopped between 65 and 70 years with 3 consecutive normal Pap tests and no abnormal Pap or HPV tests in the past 10 years.  Fecal occult blood test (FOBT) of stool. / Every year beginning at age 15 years and continuing until age 17 years. You may not need to do this test if you get a colonoscopy every 10 years.  Flexible sigmoidoscopy or colonoscopy.** / Every 5 years for a flexible sigmoidoscopy or every 10 years for a colonoscopy beginning at age 86 years and continuing until age 71 years.  Hepatitis C blood test.** / For all people born from [birth year] through 1966 and any individual with known risks for hepatitis C.  Osteoporosis screening.** / A one-time screening for women ages 83 years and over and women at risk for fractures or osteoporosis.  Skin self-exam. / Monthly.  Influenza vaccine. / Every year.  Tetanus, diphtheria, and acellular pertussis (Tdap/Td) vaccine.** / 1 dose of Td every 10 years.  Varicella vaccine.** / Consult your health care provider.  Zoster vaccine.** / 1 dose for adults aged 61 years or older.  Pneumococcal 13-valent conjugate (PCV13) vaccine.** / Consult your health care provider.  Pneumococcal  polysaccharide (PPSV23) vaccine.** / 1 dose for all adults aged 28 years and older.  Meningococcal vaccine.** / Consult your health care provider.  Hepatitis A vaccine.** / Consult your health care provider.  Hepatitis B vaccine.** / Consult your health care provider.  Haemophilus influenzae type b (Hib) vaccine.** / Consult your health care provider. ** Family history and personal history of risk and conditions may change your health care provider's recommendations. Document Released: 06/03/2001 Document Revised: 08/22/2013 Document Reviewed: 09/02/2010 [facility] Patient Information 2015 [place], [place]. This information is not intended to replace advice given to you by your health care provider. Make sure you discuss any questions you have with your health care provider.

## 2014-03-09 NOTE — Progress Notes (Signed)
Subjective:     Heather [name] is a 53 y.o. female and is here for a comprehensive physical exam. The patient reports problems - sinus headaches daily-- otc does not help.  Pt also needs labs to check cholesterol and glucose.    History   Social History  . Marital Status: Single    Spouse Name: N/A    Number of Children: N/A  . Years of Education: 20   Occupational History  . [employer]manage lab    Social History Main Topics  . Smoking status: Never Smoker   . Smokeless tobacco: Never Used  . Alcohol Use: No  . Drug Use: No  . Sexual Activity: Yes   Other Topics Concern  . Not on file   Social History Narrative   Exercise-- no   Health Maintenance  Topic Date Due  . INFLUENZA VACCINE  11/20/2014  . MAMMOGRAM  07/16/2015  . PAP SMEAR  01/08/2016  . TETANUS/TDAP  07/25/2018  . COLONOSCOPY  10/31/2019    The following portions of the patient's history were reviewed and updated as appropriate:  She  has a past medical history of Allergic rhinitis; Hyperlipidemia; Hypertension; Glaucoma; and Acute bronchitis. She  does not have any pertinent problems on file. She  has past surgical history that includes Abdominal hysterectomy (1998) and Breast lumpectomy. Her family history includes Alzheimer's disease in her father; Atrial fibrillation in her father and another family member; Cancer in her maternal grandfather, maternal grandmother, and paternal grandfather; Cancer (age of onset: 77) in her father; Cancer (age of onset: 22) in her brother; Colon cancer in her brother; Dementia in her father; Diabetes in her maternal aunt and maternal uncle; Endometrial cancer in an other family member; Hyperlipidemia in her brother and brother; Hypertension in her brother, brother, and brother; Kidney disease in her maternal uncle; Pulmonary embolism in her brother; Stroke in her father. She  reports that she has never smoked. She has never used smokeless tobacco. She  reports that she does not drink alcohol or use illicit drugs. She has a current medication list which includes the following prescription(s): alprazolam, brimonidine-timolol, fluticasone, furosemide, glucosamine-chondroitin, lisinopril, meloxicam, multivitamin, pravastatin, and sumatriptan. Current Outpatient Prescriptions on File Prior to Visit  Medication Sig Dispense Refill  . ALPRAZolam (XANAX) 0.25 MG tablet Take 0.25 mg by mouth 3 (three) times daily as needed.      . brimonidine-timolol (COMBIGAN) 0.2-0.5 % ophthalmic solution Place 1 drop into both eyes every 12 (twelve) hours.      . fluticasone (FLONASE) 50 MCG/ACT nasal spray Place 2 sprays into both nostrils daily. 16 g 1  . furosemide (LASIX) 20 MG tablet TAKE ONE TABLET BY MOUTH ONCE DAILY 90 tablet 1  . glucosamine-chondroitin 500-400 MG tablet Take 1 tablet by mouth 3 (three) times daily.    Marland Kitchen lisinopril (PRINIVIL,ZESTRIL) 10 MG tablet TAKE ONE TABLET BY MOUTH ONCE DAILY 90 tablet 1  . meloxicam (MOBIC) 15 MG tablet 1/2-1 po qd prn pain 30 tablet 1  . Multiple Vitamin (MULTIVITAMIN) tablet Take 1 tablet by mouth daily.      . pravastatin (PRAVACHOL) 20 MG tablet Take 1 tablet (20 mg total) by mouth daily. 90 tablet 3   No current facility-administered medications on file prior to visit.   She is allergic to tamiflu..  Review of Systems  Review of Systems  Constitutional: Negative for activity change, appetite change and fatigue.  HENT: Negative for hearing loss, congestion, tinnitus and ear discharge.  dentist q80m Eyes: Negative for visual disturbance (see optho q1y -- vision corrected to 20/20 with glasses).  Respiratory: Negative for cough, chest tightness and shortness of breath.   Cardiovascular: Negative for chest pain, palpitations and leg swelling.  Gastrointestinal: Negative for abdominal pain, diarrhea, constipation and abdominal distention.  Genitourinary: Negative for urgency, frequency, decreased urine volume  and difficulty urinating.  Musculoskeletal: Negative for back pain, arthralgias and gait problem.  Skin: Negative for color change, pallor and rash.  Neurological: Negative for dizziness, light-headedness, numbness and headaches.  Hematological: Negative for adenopathy. Does not bruise/bleed easily.  Psychiatric/Behavioral: Negative for suicidal ideas, confusion, sleep disturbance, self-injury, dysphoric mood, decreased concentration and agitation.      Objective:    BP 112/70 mmHg  Pulse 61  Temp(Src) 97.8 F (36.6 C) (Oral)  Ht 5' 5.75" (1.67 m)  Wt 230 lb 12.8 oz (104.69 kg)  BMI 37.54 kg/m2  SpO2 99% General appearance: alert, cooperative, appears stated age and no distress Head: Normocephalic, without obvious abnormality, atraumatic Eyes: conjunctivae/corneas clear. PERRL, EOM's intact. Fundi benign. Ears: normal TM's and external ear canals both ears Nose: Nares normal. Septum midline. Mucosa normal. No drainage or sinus tenderness. Throat: lips, mucosa, and tongue normal; teeth and gums normal Neck: no adenopathy, no carotid bruit, no JVD, supple, symmetrical, trachea midline and thyroid not enlarged, symmetric, no tenderness/mass/nodules Back: symmetric, no curvature. ROM normal. No CVA tenderness. Lungs: clear to auscultation bilaterally Breasts: normal appearance, no masses or tenderness Heart: regular rate and rhythm, S1, S2 normal, no murmur, click, rub or gallop Abdomen: soft, non-tender; bowel sounds normal; no masses,  no organomegaly Pelvic: not indicated; status post hysterectomy, negative ROS Extremities: extremities normal, atraumatic, no cyanosis or edema Pulses: 2+ and symmetric Skin: Skin color, texture, turgor normal. No rashes or lesions Lymph nodes: Cervical, supraclavicular, and axillary nodes normal. Neurologic: Alert and oriented X 3, normal strength and tone. Normal symmetric reflexes. Normal coordination and gait Psych- no depression, no anxiety       Assessment:    Healthy female exam.      Plan:     ghm utd Check labs See After Visit Summary for Counseling Recommendations   1. Sinus headache con't zyrtec and flonase - SUMAtriptan (IMITREX) 50 MG tablet; Take 1 tablet (50 mg total) by mouth every 2 (two) hours as needed for migraine or headache. May repeat in 2 hours if headache persists or recurs.  Dispense: 10 tablet; Refill: 0  2. Hyperlipidemia Check labs , con't meds  - Hepatic function panel - Lipid panel  3. Essential hypertension Stable, con't meds - Basic metabolic panel - CBC with Differential  4. Hyperglycemia Check labs - Basic metabolic panel - CBC with Differential - Hemoglobin A1c  5. Preventative health care   - Basic metabolic panel - CBC with Differential - Hepatic function panel - Lipid panel - POCT urinalysis dipstick - TSH

## 2014-03-09 NOTE — Progress Notes (Signed)
Pre visit review using our clinic review tool, if applicable. No additional management support is needed unless otherwise documented below in the visit note. 

## 2014-03-27 ENCOUNTER — Telehealth: Payer: Self-pay

## 2014-03-27 DIAGNOSIS — E785 Hyperlipidemia, unspecified: Secondary | ICD-10-CM

## 2014-03-27 MED ORDER — PRAVASTATIN SODIUM 20 MG PO TABS: 20.0000 mg | ORAL_TABLET | Freq: Every day | ORAL | Status: DC

## 2014-03-27 NOTE — Telephone Encounter (Signed)
Pharmacy:  [facility] [address]  Request:   Pravastatin 20 mg tablets---Take one tablet by mouth once daily--90 tablets  Last filled:  01/07/13 Amt: 90 tablets, 3 refills  Last OV:  03/09/14 Labs:  03/09/14  Med filled.  90 tablets, 1 refill.

## 2014-06-13 ENCOUNTER — Ambulatory Visit (INDEPENDENT_AMBULATORY_CARE_PROVIDER_SITE_OTHER): Payer: 59 | Admitting: Family Medicine

## 2014-06-13 ENCOUNTER — Encounter: Payer: Self-pay | Admitting: Family Medicine

## 2014-06-13 VITALS — BP 146/64 | HR 51 | Temp 98.0°F | Wt 229.2 lb

## 2014-06-13 DIAGNOSIS — N644 Mastodynia: Secondary | ICD-10-CM

## 2014-06-13 NOTE — Progress Notes (Signed)
Pre visit review using our clinic review tool, if applicable. No additional management support is needed unless otherwise documented below in the visit note. 

## 2014-06-13 NOTE — Patient Instructions (Signed)
Breast Cyst A breast cyst is a sac in the breast that is filled with fluid. Breast cysts are common in women. Women can have one or many cysts. When the breasts contain many cysts, it is usually due to a noncancerous (benign) condition called fibrocystic change. These lumps form under the influence of female hormones (estrogen and progesterone). The lumps are most often located in the upper, outer portion of the breast. They are often more swollen, painful, and tender before your period starts. They usually disappear after menopause, unless you are on hormone therapy.  There are several types of cysts:  Macrocyst. This is a cyst that is about 2 in. (5.1 cm) in diameter.   Microcyst. This is a tiny cyst that you cannot feel but can be seen with a mammogram or an ultrasound.   Galactocele. This is a cyst containing milk that may develop if you suddenly stop breastfeeding.   Sebaceous cyst of the skin. This type of cyst is not in the breast tissue itself. Breast cysts do not increase your risk of breast cancer. However, they must be monitored closely because they can be cancerous.  CAUSES  It is not known exactly what causes a breast cyst to form. Possible causes include:  An overgrowth of milk glands and connective tissue in the breast can block the milk glands, causing them to fill with fluid.   Scar tissue in the breast from previous surgery may block the glands, causing a cyst.  RISK FACTORS Estrogen may influence the development of a breast cyst.  SIGNS AND SYMPTOMS   Feeling a smooth, round, soft lump (like a grape) in the breast that is easily moveable.   Breast discomfort or pain.  Increase in size of the lump before your menstrual period and decrease in its size after your menstrual period.  DIAGNOSIS  A cyst can be felt during a physical exam by your health care provider. A breast X-ray exam (mammogram) and ultrasonography will be done to confirm the diagnosis. Fluid may  be removed from the cyst with a needle (fine needle aspiration) to make sure the cyst is not cancerous.  TREATMENT  Treatment may not be necessary. Your health care provider may monitor the cyst to see if it goes away on its own. If treatment is needed, it may include:  Hormone treatment.   Needle aspiration. There is a chance of the cyst coming back after aspiration.   Surgery to remove the whole cyst.  HOME CARE INSTRUCTIONS   Keep all follow-up appointments with your health care provider.  See your health care provider regularly:  Get a yearly exam by your health care provider.  Have a clinical breast exam by a health care provider every 1-3 years if you are 20-40 years of age. After age 40 years, you should have the exam every year.   Get mammogram tests as directed by your health care provider.   Understand the normal appearance and feel of your breasts and perform breast self-exams.   Only take over-the-counter or prescription medicines as directed by your health care provider.   Wear a supportive bra, especially when exercising.   Avoid caffeine.   Reduce your salt intake, especially before your menstrual period. Too much salt can cause fluid retention, breast swelling, and discomfort.  SEEK MEDICAL CARE IF:   You feel, or think you feel, a lump in your breast.   You notice that both breasts look or feel different than usual.   Your   breast is still causing pain after your menstrual period is over.   You need medicine for breast pain and swelling that occurs with your menstrual period.  SEEK IMMEDIATE MEDICAL CARE IF:   You have severe pain, tenderness, redness, or warmth in your breast.   You have nipple discharge or bleeding.   Your breast lump becomes hard and painful.   You find new lumps or bumps that were not there before.   You feel lumps in your armpit (axilla).   You notice dimpling or wrinkling of the breast or nipple.   You  have a fever.  MAKE SURE YOU:  Understand these instructions.  Will watch your condition.  Will get help right away if you are not doing well or get worse. Document Released: 04/07/2005 Document Revised: 12/08/2012 Document Reviewed: 11/04/2012 [employer] Patient Information 2015 [employer]. This information is not intended to replace advice given to you by your health care provider. Make sure you discuss any questions you have with your health care provider.  

## 2014-06-13 NOTE — Progress Notes (Signed)
   Subjective:    Patient ID: Heather [name], female    DOB: 1961/03/11, 54 y.o.   MRN: [id number]  HPI  Patient here for pain and knot on breast.  Pt tried to make a screening mammogram appointment but was told she needed to come here so a diagnostic mammogram could be ordered.   Pt states pain and knot have resolved.  But she is still concerned.   Past Medical History  Diagnosis Date  . Allergic rhinitis   . Hyperlipidemia   . Hypertension   . Glaucoma   . Acute bronchitis     Review of Systems  Constitutional: Negative for activity change, appetite change, fatigue and unexpected weight change.  Respiratory: Negative for cough and shortness of breath.   Cardiovascular: Negative for chest pain and palpitations.  Genitourinary: Negative for dysuria, vaginal bleeding, vaginal discharge and difficulty urinating.  Psychiatric/Behavioral: Negative for behavioral problems and dysphoric mood. The patient is not nervous/anxious.        Objective:    Physical Exam  Constitutional: She appears well-developed and well-nourished.  Pulmonary/Chest: Effort normal and breath sounds normal.  Abdominal: Soft. Bowel sounds are normal.  Genitourinary: There is breast tenderness. No breast swelling, discharge or bleeding. Pelvic exam was performed with patient supine.  + tenderness around 1 o ' clock on breast --- pt states she felt a knot but it is now gone  Nursing note and vitals reviewed.   BP 146/64 mmHg  Pulse 51  Temp(Src) 98 F (36.7 C) (Oral)  Wt 229 lb 3.2 oz (103.964 kg)  SpO2 97% Wt Readings from Last 3 Encounters:  06/13/14 229 lb 3.2 oz (103.964 kg)  03/09/14 230 lb 12.8 oz (104.69 kg)  12/09/13 230 lb 3.2 oz (104.418 kg)     Lab Results  Component Value Date   WBC 5.6 03/09/2014   HGB 12.9 03/09/2014   HCT 40.5 03/09/2014   PLT 188.0 03/09/2014   GLUCOSE 103* 03/09/2014   CHOL 244* 03/09/2014   TRIG 70.0 03/09/2014   HDL 66.90 03/09/2014   LDLDIRECT 128.0  01/03/2013   LDLCALC 163* 03/09/2014   ALT 15 03/09/2014   AST 19 03/09/2014   NA 141 03/09/2014   K 4.9 03/09/2014   CL 102 03/09/2014   CREATININE 0.8 03/09/2014   BUN 17 03/09/2014   CO2 31 03/09/2014   TSH 0.95 03/09/2014   HGBA1C 6.1 03/09/2014   MICROALBUR 0.2 01/03/2013    No results found.     Assessment & Plan:   Problem List Items Addressed This Visit    None    Visit Diagnoses    Breast pain    -  Primary    Relevant Orders    MM Digital Diagnostic Bilat        Garnet Koyanagi, DO

## 2014-07-14 ENCOUNTER — Other Ambulatory Visit: Payer: Self-pay | Admitting: Family Medicine

## 2014-07-17 ENCOUNTER — Encounter: Payer: Self-pay | Admitting: Family Medicine

## 2014-09-07 ENCOUNTER — Encounter: Payer: Self-pay | Admitting: Gastroenterology

## 2014-10-03 ENCOUNTER — Encounter: Payer: Self-pay | Admitting: Gastroenterology

## 2014-11-24 ENCOUNTER — Ambulatory Visit (AMBULATORY_SURGERY_CENTER): Payer: Self-pay

## 2014-11-24 VITALS — Ht 66.0 in | Wt 237.0 lb

## 2014-11-24 DIAGNOSIS — Z800 Family history of malignant neoplasm of digestive organs: Secondary | ICD-10-CM

## 2014-11-24 MED ORDER — NA SULFATE-K SULFATE-MG SULF 17.5-3.13-1.6 GM/177ML PO SOLN: 1.0000 | Freq: Once | ORAL | Status: DC

## 2014-11-24 NOTE — Progress Notes (Signed)
No egg or soy allergy.  No previous complications from anesthesia. No home O2. No diet meds. 

## 2014-12-07 ENCOUNTER — Ambulatory Visit (AMBULATORY_SURGERY_CENTER): Payer: 59 | Admitting: Gastroenterology

## 2014-12-07 ENCOUNTER — Encounter: Payer: Self-pay | Admitting: Gastroenterology

## 2014-12-07 VITALS — BP 126/82 | HR 54 | Temp 95.9°F | Resp 19 | Ht 66.0 in | Wt 237.0 lb

## 2014-12-07 DIAGNOSIS — Z800 Family history of malignant neoplasm of digestive organs: Secondary | ICD-10-CM

## 2014-12-07 DIAGNOSIS — D122 Benign neoplasm of ascending colon: Secondary | ICD-10-CM

## 2014-12-07 DIAGNOSIS — D123 Benign neoplasm of transverse colon: Secondary | ICD-10-CM

## 2014-12-07 DIAGNOSIS — K635 Polyp of colon: Secondary | ICD-10-CM | POA: Diagnosis not present

## 2014-12-07 DIAGNOSIS — Z1211 Encounter for screening for malignant neoplasm of colon: Secondary | ICD-10-CM | POA: Diagnosis not present

## 2014-12-07 DIAGNOSIS — D125 Benign neoplasm of sigmoid colon: Secondary | ICD-10-CM | POA: Diagnosis not present

## 2014-12-07 MED ORDER — SODIUM CHLORIDE 0.9 % IV SOLN: 500.0000 mL | INTRAVENOUS | Status: DC

## 2014-12-07 NOTE — Progress Notes (Signed)
Report to PACU, RN, vss, BBS= Clear.  

## 2014-12-07 NOTE — Patient Instructions (Signed)
YOU HAD AN ENDOSCOPIC PROCEDURE TODAY AT [facility]:   Refer to the procedure report that was given to you for any specific questions about what was found during the examination.  If the procedure report does not answer your questions, please call your gastroenterologist to clarify.  If you requested that your care partner not be given the details of your procedure findings, then the procedure report has been included in a sealed envelope for you to review at your convenience later.  YOU SHOULD EXPECT: Some feelings of bloating in the abdomen. Passage of more gas than usual.  Walking can help get rid of the air that was put into your GI tract during the procedure and reduce the bloating. If you had a lower endoscopy (such as a colonoscopy or flexible sigmoidoscopy) you may notice spotting of blood in your stool or on the toilet paper. If you underwent a bowel prep for your procedure, you may not have a normal bowel movement for a few days.  Please Note:  You might notice some irritation and congestion in your nose or some drainage.  This is from the oxygen used during your procedure.  There is no need for concern and it should clear up in a day or so.  SYMPTOMS TO REPORT IMMEDIATELY:   Following lower endoscopy (colonoscopy or flexible sigmoidoscopy):  Excessive amounts of blood in the stool  Significant tenderness or worsening of abdominal pains  Swelling of the abdomen that is new, acute  Fever of 100F or higher  For urgent or emergent issues, a gastroenterologist can be reached at any hour by calling [phone].   DIET: Your first meal following the procedure should be a small meal and then it is ok to progress to your normal diet. Heavy or fried foods are harder to digest and may make you feel nauseous or bloated.  Likewise, meals heavy in dairy and vegetables can increase bloating.  Drink plenty of fluids but you should avoid alcoholic beverages for 24  hours.  ACTIVITY:  You should plan to take it easy for the rest of today and you should NOT DRIVE or use heavy machinery until tomorrow (because of the sedation medicines used during the test).    FOLLOW UP: Our staff will call the number listed on your records the next business day following your procedure to check on you and address any questions or concerns that you may have regarding the information given to you following your procedure. If we do not reach you, we will leave a message.  However, if you are feeling well and you are not experiencing any problems, there is no need to return our call.  We will assume that you have returned to your regular daily activities without incident.  If any biopsies were taken you will be contacted by phone or by letter within the next 1-3 weeks.  Please call us at [phone] if you have not heard about the biopsies in 3 weeks.   SIGNATURES/CONFIDENTIALITY: You and/or your care partner have signed paperwork which will be entered into your electronic medical record.  These signatures attest to the fact that that the information above on your After Visit Summary has been reviewed and is understood.  Full responsibility of the confidentiality of this discharge information lies with you and/or your care-partner.  No aspirin, aspirin containing products (Goody or BC powders) or NSAIDs (Ibuprofen, Advil, Aleve, Motrin) for 2 weeks, Tylenol is ok to take  Please read  over handouts about polyps

## 2014-12-07 NOTE — Progress Notes (Signed)
Called to room to assist during endoscopic procedure.  Patient ID and intended procedure confirmed with present staff. Received instructions for my participation in the procedure from the performing physician.  

## 2014-12-07 NOTE — Op Note (Signed)
Heather [name]. [place], [id number]   COLONOSCOPY PROCEDURE REPORT  PATIENT: [name], [name]  MR#: [id number] BIRTHDATE: [birth year] , 54  yrs. old GENDER: female ENDOSCOPIST: Ladene Artist, MD, [facility] PROCEDURE DATE:  12/07/2014 PROCEDURE:   Colonoscopy, screening, Colonoscopy with biopsy, and Colonoscopy with snare polypectomy First Screening Colonoscopy - Avg.  risk and is 50 yrs.  old or older - No.  Prior Negative Screening - Now for repeat screening. Less than 10 yrs Prior Negative Screening - Now for repeat screening.  Above average risk  History of Adenoma - Now for follow-up colonoscopy & has been > or = to 3 yrs.  N/A  Polyps removed today? Yes ASA CLASS:   Class II INDICATIONS:Screening for colonic neoplasia and FH Colon or Rectal Adenocarcinoma. MEDICATIONS: Monitored anesthesia care and Propofol 350 mg IV DESCRIPTION OF PROCEDURE:   After the risks benefits and alternatives of the procedure were thoroughly explained, informed consent was obtained.  The digital rectal exam revealed no abnormalities of the rectum.   The LB [id number]  endoscope was introduced through the anus and advanced to the cecum, which was identified by both the appendix and ileocecal valve. No adverse events experienced with a tortuous colon.   The quality of the prep was adequate, after extensive rinsing and suctioning (Suprep was used)  The instrument was then slowly withdrawn as the colon was fully examined. Estimated blood loss is zero unless otherwise noted in this procedure report.    COLON FINDINGS: A sessile polyp measuring 8 mm in size was found in the ascending colon.  A polypectomy was performed using snare cautery.  The resection was complete, the polyp tissue was completely retrieved and sent to histology.  Four sessile polyps measuring 5-6 mm in size were found in the sigmoid colon (3) and transverse colon (1).  Polypectomies were performed  with a cold snare.  The resection was complete, the polyp tissue was completely retrieved and sent to histology.   A sessile polyp measuring 3 mm in size was found in the sigmoid colon.  A polypectomy was performed with cold forceps.  The resection was complete, the polyp tissue was completely retrieved and sent to histology.   The examination was otherwise normal.  Retroflexed views revealed no abnormalities. The time to cecum = 3.8 Withdrawal time = 21.4   The scope was withdrawn and the procedure completed.  COMPLICATIONS: There were no immediate complications.   ENDOSCOPIC IMPRESSION: 1.   Sessile polyp in the ascending colon; polypectomy performed using snare cautery 2.   Four sessile polyps in the sigmoid colon and transverse colon; polypectomies performed with a cold snare 3.   Sessile polyp in the sigmoid colon; polypectomy was performed with cold forceps 4.   The examination was otherwise normal  RECOMMENDATIONS: 1.  Await pathology results 2.  Hold Aspirin and all other NSAIDS for 2 weeks. 3.  Repeat colonoscopy in 3 years if 3 or more polyps adenomatous; otherwise 5 years with a more extensive bowel prep  eSigned:  Ladene Artist, MD, [facility] 12/07/2014 8:35 AM     PATIENT NAME:  Heather [name], Heather [name] MR#: [id number]

## 2014-12-08 ENCOUNTER — Telehealth: Payer: Self-pay | Admitting: *Deleted

## 2014-12-08 NOTE — Telephone Encounter (Signed)
Left message on f/u callback 

## 2014-12-14 ENCOUNTER — Encounter: Payer: Self-pay | Admitting: Gastroenterology

## 2015-01-06 ENCOUNTER — Other Ambulatory Visit: Payer: Self-pay | Admitting: Family Medicine

## 2015-01-06 DIAGNOSIS — E119 Type 2 diabetes mellitus without complications: Secondary | ICD-10-CM

## 2015-01-06 DIAGNOSIS — E785 Hyperlipidemia, unspecified: Secondary | ICD-10-CM

## 2015-01-08 NOTE — Telephone Encounter (Signed)
Tiff. Would you schedule this patient a lab apt and a CPE for Late Nov/Dec and forward back to me. The Orders are in.  Thanks     KP

## 2015-01-18 NOTE — Telephone Encounter (Signed)
LVM advising patient regarding message below

## 2015-02-26 ENCOUNTER — Other Ambulatory Visit: Payer: Self-pay | Admitting: Family Medicine

## 2015-03-27 ENCOUNTER — Ambulatory Visit (INDEPENDENT_AMBULATORY_CARE_PROVIDER_SITE_OTHER): Payer: 59 | Admitting: Medical

## 2015-03-27 ENCOUNTER — Encounter: Payer: Self-pay | Admitting: Medical

## 2015-03-27 VITALS — BP 114/80 | HR 62 | Temp 98.0°F | Ht 65.75 in | Wt 247.2 lb

## 2015-03-27 DIAGNOSIS — M79644 Pain in right finger(s): Secondary | ICD-10-CM

## 2015-03-27 DIAGNOSIS — J309 Allergic rhinitis, unspecified: Secondary | ICD-10-CM | POA: Diagnosis not present

## 2015-03-27 DIAGNOSIS — H6692 Otitis media, unspecified, left ear: Secondary | ICD-10-CM

## 2015-03-27 MED ORDER — CEFTRIAXONE SODIUM 1 G IJ SOLR
1.0000 g | Freq: Once | INTRAMUSCULAR | Status: AC
  Administered 2015-03-27: 1 g via INTRAMUSCULAR

## 2015-03-27 MED ORDER — AMOXICILLIN-POT CLAVULANATE 875-125 MG PO TABS: 1.0000 | ORAL_TABLET | Freq: Two times a day (BID) | ORAL | Status: DC

## 2015-03-27 NOTE — Patient Instructions (Addendum)
For om, we gave rocephin 1 gram im. Start augmentin tomorrow. With faint pain behind ear it is good idea to have appointment on Friday. If you by chance  you make dramatic improvement then could cancel on Thursday.   If your pain behind ear worsen then come in before Friday. If severe and after hours then ED evaluation.  I think allergy congestion preceded om so can get claritin otc and do flonase or netty pot if you prefer.  Follow up Friday or as needed.  If end up canceling for Friday. Then reschedule for 7-10 days from today.  At very end of the exam she mention intermittent rt thumb pain at distal joint. Offered and put in xray. Pt states she may wait and get study in January with new insurance.

## 2015-03-27 NOTE — Progress Notes (Signed)
Pre visit review using our clinic review tool, if applicable. No additional management support is needed unless otherwise documented below in the visit note. 

## 2015-03-27 NOTE — Progress Notes (Signed)
Subjective:    Patient ID: Heather [name], female    DOB: [birth year], 54 y.o.   MRN: [id number]  HPI  Pt in for left ear pain. Pain over weekend. Some pain behind her left ear. Some subjective fever yesterday. Some nasal congestion, sneezing and itchy eyes. Pt has hx of sinus congestion and allergies.(almost years round). Pt has flonase but does not like to use. She does use netty pot. No use of netty pot since got sick.  Review of Systems  Constitutional: Positive for fever and fatigue. Negative for chills.       Yesterday after work had subjective fever and felt tired.  HENT: Positive for congestion, ear pain and sneezing. Negative for postnasal drip.   Eyes: Positive for itching.  Respiratory: Negative for cough, chest tightness, shortness of breath and wheezing.   Cardiovascular: Negative for chest pain and palpitations.  Neurological: Negative for dizziness and headaches.  Hematological: Negative for adenopathy. Does not bruise/bleed easily.  Psychiatric/Behavioral: Negative for behavioral problems.    Past Medical History  Diagnosis Date  . Allergic rhinitis   . Hyperlipidemia   . Hypertension   . Glaucoma   . Acute bronchitis   . Allergy   . Anemia   . Arthritis     knees    Social History   Social History  . Marital Status: Single    Spouse Name: N/A  . Number of Children: N/A  . Years of Education: 20   Occupational History  . [employer]--manage lab    Social History Main Topics  . Smoking status: Never Smoker   . Smokeless tobacco: Never Used  . Alcohol Use: 0.0 oz/week    0 Standard drinks or equivalent per week     Comment: occ  . Drug Use: No  . Sexual Activity: Yes   Other Topics Concern  . Not on file   Social History Narrative   Exercise-- no    Past Surgical History  Procedure Laterality Date  . Abdominal hysterectomy  1998  . Breast lumpectomy    . Cesarean section      Family History  Problem Relation Age of  Onset  . Endometrial cancer    . Atrial fibrillation    . Dementia Father   . Stroke Father   . Cancer Father 58    prostate  . Atrial fibrillation Father   . Alzheimer's disease Father   . Hyperlipidemia Brother   . Hypertension Brother   . Diabetes Maternal Aunt   . Diabetes Maternal Uncle   . Kidney disease Maternal Uncle     dialysis  . Cancer Maternal Grandmother     uterine  . Cancer Maternal Grandfather   . Cancer Paternal Grandfather     pancreatic  . Hyperlipidemia Brother   . Hypertension Brother   . Hypertension Brother   . Pulmonary embolism Brother   . Cancer - Colon Brother 60    colon CA--stage 4  . Colon cancer Brother   . Colon cancer Cousin 72    Allergies  Allergen Reactions  . Tamiflu     Current Outpatient Prescriptions on File Prior to Visit  Medication Sig Dispense Refill  . brimonidine-timolol (COMBIGAN) 0.2-0.5 % ophthalmic solution Place 1 drop into both eyes every 12 (twelve) hours.      . Ferrous Sulfate Dried (GNP IRON) 200 (65 FE) MG TABS Take 65 mg by mouth every other day.    . fluticasone (FLONASE) 50 MCG/ACT nasal  spray Place 2 sprays into both nostrils daily. 16 g 1  . furosemide (LASIX) 20 MG tablet TAKE ONE TABLET BY MOUTH ONCE DAILY 90 tablet 0  . lisinopril (PRINIVIL,ZESTRIL) 10 MG tablet TAKE ONE TABLET BY MOUTH ONCE DAILY 90 tablet 3  . Multiple Vitamin (MULTIVITAMIN) tablet Take 1 tablet by mouth daily.      . pravastatin (PRAVACHOL) 20 MG tablet Take 1 tablet (20 mg total) by mouth daily. 90 tablet 0  . simvastatin (ZOCOR) 40 MG tablet Take 40 mg by mouth daily.    Marland Kitchen gabapentin (NEURONTIN) 100 MG capsule Take 100 mg by mouth 3 (three) times daily.     No current facility-administered medications on file prior to visit.    BP 114/80 mmHg  Pulse 62  Temp(Src) 98 F (36.7 C) (Oral)  Ht 5' 5.75" (1.67 m)  Wt 247 lb 3.2 oz (112.129 kg)  BMI 40.21 kg/m2  SpO2 98%       Objective:   Physical Exam  General  Mental  Status - Alert. General Appearance - Well groomed. Not in acute distress.  Skin Rashes- No Rashes.  HEENT Head- Normal. Ear Auditory Canal - Left- Normal. Right - Normal.Tympanic Membrane- Left- very bright red with slight buldge appearance(just behind ear moderate tender and faint tender over mastoid bone.. Right- Normal. Eye Sclera/Conjunctiva- Left- Normal. Right- Normal. Nose & Sinuses Nasal Mucosa- Left-   Mild Boggy and Congested. Right-   Mild Boggy and  Congested.Bilateral no maxillary and no frontal sinus pressure. Mouth & Throat Lips: Upper Lip- Normal: no dryness, cracking, pallor, cyanosis, or vesicular eruption. Lower Lip-Normal: no dryness, cracking, pallor, cyanosis or vesicular eruption. Buccal Mucosa- Bilateral- No Aphthous ulcers. Oropharynx- No Discharge or Erythema. Tonsils: Characteristics- Bilateral- No Erythema or Congestion. Size/Enlargement- Bilateral- No enlargement. Discharge- bilateral-None.  Neck Neck- Supple. No Masses.   Chest and Lung Exam Auscultation: Breath Sounds:-Clear even and unlabored.  Cardiovascular Auscultation:Rythm- Regular, rate and rhythm. Murmurs & Other Heart Sounds:Ausculatation of the heart reveal- No Murmurs.  Lymphatic Head & Neck General Head & Neck Lymphatics: Bilateral: Description- No Localized lymphadenopathy.  Rt thumb- dorsal aspect- mild faint above dip joint(mild swelling almost like nodule appearance forming). No warmth. No tenderness.        Assessment & Plan:  For om, we gave rocephin 1 gram im. Start augmentin tomorrow. With faint pain behind ear it is good idea to have appointment on Friday. If you by chance  you make dramatic improvement then could cancel on Thursday.   If your pain behind ear worsen then come in before Friday. If severe and after hours then ED evaluation.  I think allergy congestion preceded om so can get claritin otc and do flonase or netty pot if you prefer.  Follow up Friday or as  needed.  If end up canceling for Friday. Then reschedule for 7-10 days from today

## 2015-03-30 ENCOUNTER — Encounter: Payer: Self-pay | Admitting: Medical

## 2015-03-30 ENCOUNTER — Ambulatory Visit (INDEPENDENT_AMBULATORY_CARE_PROVIDER_SITE_OTHER): Payer: 59 | Admitting: Medical

## 2015-03-30 VITALS — BP 129/77 | HR 64 | Temp 97.6°F | Ht 65.75 in | Wt 248.4 lb

## 2015-03-30 DIAGNOSIS — H6692 Otitis media, unspecified, left ear: Secondary | ICD-10-CM | POA: Diagnosis not present

## 2015-03-30 MED ORDER — CEFTRIAXONE SODIUM 1 G IJ SOLR
1.0000 g | Freq: Once | INTRAMUSCULAR | Status: AC
  Administered 2015-03-30: 1 g via INTRAMUSCULAR

## 2015-03-30 NOTE — Patient Instructions (Addendum)
Much improved compared to other day. Continue augmentin. Since some pain still behind ear(but less) will give rocephin 1 gram im today again. I don't think you will need another IM rocephin. But give [place] update on Tuesday. If pain persists will try to get you in with ENT.  Follow up if needed in a week(but based on tm appearance I anticipate that may not be necessary)

## 2015-03-30 NOTE — Progress Notes (Signed)
Subjective:    Patient ID: Heather [name], female    DOB: [birth year], 54 y.o.   MRN: [id number]  HPI  Pt in for follow up. She has less pain that before. No fever, no chills, or sweats. Still has some pain just behind left ear but a lot less. I wanted her in office today for check on mastoid region before the weekend.    Review of Systems  Constitutional: Negative for fever, chills and fatigue.  HENT: Positive for ear pain. Negative for congestion, sinus pressure, sneezing and sore throat.        Better but still tender.  Respiratory: Negative for cough, chest tightness, shortness of breath and wheezing.   Cardiovascular: Negative for chest pain and palpitations.  Neurological: Negative for headaches.  Hematological: Negative for adenopathy. Does not bruise/bleed easily.  Psychiatric/Behavioral: Negative for behavioral problems and confusion.   Past Medical History  Diagnosis Date  . Allergic rhinitis   . Hyperlipidemia   . Hypertension   . Glaucoma   . Acute bronchitis   . Allergy   . Anemia   . Arthritis     knees    Social History   Social History  . Marital Status: Single    Spouse Name: N/A  . Number of Children: N/A  . Years of Education: 20   Occupational History  . [employer]--manage lab    Social History Main Topics  . Smoking status: Never Smoker   . Smokeless tobacco: Never Used  . Alcohol Use: 0.0 oz/week    0 Standard drinks or equivalent per week     Comment: occ  . Drug Use: No  . Sexual Activity: Yes   Other Topics Concern  . Not on file   Social History Narrative   Exercise-- no    Past Surgical History  Procedure Laterality Date  . Abdominal hysterectomy  1998  . Breast lumpectomy    . Cesarean section      Family History  Problem Relation Age of Onset  . Endometrial cancer    . Atrial fibrillation    . Dementia Father   . Stroke Father   . Cancer Father 66    prostate  . Atrial fibrillation Father   .  Alzheimer's disease Father   . Hyperlipidemia Brother   . Hypertension Brother   . Diabetes Maternal Aunt   . Diabetes Maternal Uncle   . Kidney disease Maternal Uncle     dialysis  . Cancer Maternal Grandmother     uterine  . Cancer Maternal Grandfather   . Cancer Paternal Grandfather     pancreatic  . Hyperlipidemia Brother   . Hypertension Brother   . Hypertension Brother   . Pulmonary embolism Brother   . Cancer - Colon Brother 60    colon CA--stage 4  . Colon cancer Brother   . Colon cancer Cousin 51    Allergies  Allergen Reactions  . Tamiflu     Current Outpatient Prescriptions on File Prior to Visit  Medication Sig Dispense Refill  . amoxicillin-clavulanate (AUGMENTIN) 875-125 MG tablet Take 1 tablet by mouth 2 (two) times daily. 20 tablet 0  . brimonidine-timolol (COMBIGAN) 0.2-0.5 % ophthalmic solution Place 1 drop into both eyes every 12 (twelve) hours.      . Ferrous Sulfate Dried (GNP IRON) 200 (65 FE) MG TABS Take 65 mg by mouth every other day.    . fluticasone (FLONASE) 50 MCG/ACT nasal spray Place 2 sprays into  both nostrils daily. 16 g 1  . furosemide (LASIX) 20 MG tablet TAKE ONE TABLET BY MOUTH ONCE DAILY 90 tablet 0  . gabapentin (NEURONTIN) 100 MG capsule Take 100 mg by mouth 3 (three) times daily.    Marland Kitchen lisinopril (PRINIVIL,ZESTRIL) 10 MG tablet TAKE ONE TABLET BY MOUTH ONCE DAILY 90 tablet 3  . Multiple Vitamin (MULTIVITAMIN) tablet Take 1 tablet by mouth daily.      . pravastatin (PRAVACHOL) 20 MG tablet Take 1 tablet (20 mg total) by mouth daily. 90 tablet 0  . simvastatin (ZOCOR) 40 MG tablet Take 40 mg by mouth daily.     No current facility-administered medications on file prior to visit.    BP 129/77 mmHg  Pulse 64  Temp(Src) 97.6 F (36.4 C) (Oral)  Ht 5' 5.75" (1.67 m)  Wt 248 lb 6.4 oz (112.674 kg)  BMI 40.40 kg/m2  SpO2 98%       Objective:   Physical Exam  General- No acute distress. Pleasant patient. Neck- Full range of  motion, no jvd Lungs- Clear, even and unlabored. Heart- regular rate and rhythm. Neurologic- CNII- XII grossly intact.  HEENT Head- Normal. Ear Auditory Canal - Left- Normal. Right - Normal.Tympanic Membrane- Left- no longer bright red or buldging, only scattered faint pink red appearance.(just behind ear less tender and less/faint tender over mastoid bone.compared to other day. Right- Normal. Eye Sclera/Conjunctiva- Left- Normal. Right- Normal. Nose & Sinuses Nasal Mucosa- Left- Mild Boggy and Congested. Right- Mild Boggy and Congested.Bilateral no maxillary and no frontal sinus pressure. Mouth & Throat Lips: Upper Lip- Normal: no dryness, cracking, pallor, cyanosis, or vesicular eruption. Lower Lip-Normal: no dryness, cracking, pallor, cyanosis or vesicular eruption. Buccal Mucosa- Bilateral- No Aphthous ulcers. Oropharynx- No Discharge or Erythema. Tonsils: Characteristics- Bilateral- No Erythema or Congestion. Size/Enlargement- Bilateral- No enlargement. Discharge- bilateral-None.     Assessment & Plan:  Much improved compared to other day. Continue augmentin. Since some pain still behind ear(but less) will give rocephin 1 gram im today again. I don't think you will need another IM rocephin. But give [place] update on Tuesday. If pain persists will try to get you in with ENT.  Follow up if needed in a week(but based on tm appearance I anticipate that may not be necessary)

## 2015-03-30 NOTE — Progress Notes (Signed)
Pre visit review using our clinic review tool, if applicable. No additional management support is needed unless otherwise documented below in the visit note. 

## 2015-05-11 ENCOUNTER — Encounter: Payer: 59 | Admitting: Family Medicine

## 2015-05-25 ENCOUNTER — Encounter: Payer: Self-pay | Admitting: Family Medicine

## 2015-05-25 ENCOUNTER — Ambulatory Visit (INDEPENDENT_AMBULATORY_CARE_PROVIDER_SITE_OTHER): Payer: 59 | Admitting: Family Medicine

## 2015-05-25 VITALS — BP 128/74 | HR 74 | Temp 98.2°F | Ht 66.0 in | Wt 249.2 lb

## 2015-05-25 DIAGNOSIS — Z0000 Encounter for general adult medical examination without abnormal findings: Secondary | ICD-10-CM | POA: Diagnosis not present

## 2015-05-25 DIAGNOSIS — M67441 Ganglion, right hand: Secondary | ICD-10-CM | POA: Diagnosis not present

## 2015-05-25 DIAGNOSIS — Z23 Encounter for immunization: Secondary | ICD-10-CM

## 2015-05-25 DIAGNOSIS — E785 Hyperlipidemia, unspecified: Secondary | ICD-10-CM

## 2015-05-25 DIAGNOSIS — Z1159 Encounter for screening for other viral diseases: Secondary | ICD-10-CM

## 2015-05-25 DIAGNOSIS — I10 Essential (primary) hypertension: Secondary | ICD-10-CM

## 2015-05-25 LAB — CBC WITH DIFFERENTIAL/PLATELET
BASOS PCT: 1 % (ref 0–1)
Basophils Absolute: 0.1 10*3/uL (ref 0.0–0.1)
Eosinophils Absolute: 0.1 10*3/uL (ref 0.0–0.7)
Eosinophils Relative: 2 % (ref 0–5)
HCT: 42.8 % (ref 36.0–46.0)
HEMOGLOBIN: 13.8 g/dL (ref 12.0–15.0)
Lymphocytes Relative: 41 % (ref 12–46)
Lymphs Abs: 2.3 10*3/uL (ref 0.7–4.0)
MCH: 25 pg — ABNORMAL LOW (ref 26.0–34.0)
MCHC: 32.2 g/dL (ref 30.0–36.0)
MCV: 77.5 fL — ABNORMAL LOW (ref 78.0–100.0)
MONO ABS: 0.4 10*3/uL (ref 0.1–1.0)
MPV: 11.3 fL (ref 8.6–12.4)
Monocytes Relative: 7 % (ref 3–12)
NEUTROS ABS: 2.8 10*3/uL (ref 1.7–7.7)
NEUTROS PCT: 49 % (ref 43–77)
PLATELETS: 203 10*3/uL (ref 150–400)
RBC: 5.52 MIL/uL — AB (ref 3.87–5.11)
RDW: 15.3 % (ref 11.5–15.5)
WBC: 5.7 10*3/uL (ref 4.0–10.5)

## 2015-05-25 LAB — COMPREHENSIVE METABOLIC PANEL
ALBUMIN: 4.3 g/dL (ref 3.6–5.1)
ALT: 21 U/L (ref 6–29)
AST: 21 U/L (ref 10–35)
Alkaline Phosphatase: 52 U/L (ref 33–130)
BUN: 19 mg/dL (ref 7–25)
CALCIUM: 9.9 mg/dL (ref 8.6–10.4)
CHLORIDE: 99 mmol/L (ref 98–110)
CO2: 29 mmol/L (ref 20–31)
Creat: 0.68 mg/dL (ref 0.50–1.05)
Glucose, Bld: 97 mg/dL (ref 65–99)
POTASSIUM: 4 mmol/L (ref 3.5–5.3)
Sodium: 140 mmol/L (ref 135–146)
TOTAL PROTEIN: 7.8 g/dL (ref 6.1–8.1)
Total Bilirubin: 0.4 mg/dL (ref 0.2–1.2)

## 2015-05-25 LAB — LIPID PANEL
CHOL/HDL RATIO: 3.6 ratio (ref <=5.0)
CHOLESTEROL: 257 mg/dL — AB (ref 125–200)
HDL: 71 mg/dL (ref >=46)
LDL Cholesterol: 173 mg/dL — ABNORMAL HIGH (ref <130)
TRIGLYCERIDES: 66 mg/dL (ref <150)
VLDL: 13 mg/dL (ref <30)

## 2015-05-25 LAB — POCT URINALYSIS DIPSTICK
BILIRUBIN UA: NEGATIVE
GLUCOSE UA: NEGATIVE
KETONES UA: NEGATIVE
Leukocytes, UA: NEGATIVE
Nitrite, UA: NEGATIVE
Protein, UA: NEGATIVE
RBC UA: NEGATIVE
SPEC GRAV UA: 1.01
UROBILINOGEN UA: 0.2
pH, UA: 6

## 2015-05-25 LAB — HEPATIC FUNCTION PANEL
ALT: 21 U/L (ref 6–29)
AST: 21 U/L (ref 10–35)
Albumin: 4.3 g/dL (ref 3.6–5.1)
Alkaline Phosphatase: 52 U/L (ref 33–130)
BILIRUBIN DIRECT: 0.1 mg/dL (ref <=0.2)
BILIRUBIN TOTAL: 0.4 mg/dL (ref 0.2–1.2)
Indirect Bilirubin: 0.3 mg/dL (ref 0.2–1.2)
Total Protein: 7.8 g/dL (ref 6.1–8.1)

## 2015-05-25 MED ORDER — PRAVASTATIN SODIUM 20 MG PO TABS: 20.0000 mg | ORAL_TABLET | Freq: Every day | ORAL | Status: DC

## 2015-05-25 MED ORDER — FUROSEMIDE 20 MG PO TABS: 20.0000 mg | ORAL_TABLET | Freq: Every day | ORAL | Status: DC

## 2015-05-25 MED ORDER — LISINOPRIL 10 MG PO TABS: 10.0000 mg | ORAL_TABLET | Freq: Every day | ORAL | Status: DC

## 2015-05-25 NOTE — Progress Notes (Signed)
Pre visit review using our clinic review tool, if applicable. No additional management support is needed unless otherwise documented below in the visit note. 

## 2015-05-25 NOTE — Patient Instructions (Signed)
Preventive Care for Adults, Female A healthy lifestyle and preventive care can promote health and wellness. Preventive health guidelines for women include the following key practices.  A routine yearly physical is a good way to check with your health care provider about your health and preventive screening. It is a chance to share any concerns and updates on your health and to receive a thorough exam.  Visit your dentist for a routine exam and preventive care every 6 months. Brush your teeth twice a day and floss once a day. Good oral hygiene prevents tooth decay and gum disease.  The frequency of eye exams is based on your age, health, family medical history, use of contact lenses, and other factors. Follow your health care provider's recommendations for frequency of eye exams.  Eat a healthy diet. Foods like vegetables, fruits, whole grains, low-fat dairy products, and lean protein foods contain the nutrients you need without too many calories. Decrease your intake of foods high in solid fats, added sugars, and salt. Eat the right amount of calories for you.Get information about a proper diet from your health care provider, if necessary.  Regular physical exercise is one of the most important things you can do for your health. Most adults should get at least 150 minutes of moderate-intensity exercise (any activity that increases your heart rate and causes you to sweat) each week. In addition, most adults need muscle-strengthening exercises on 2 or more days a week.  Maintain a healthy weight. The body mass index (BMI) is a screening tool to identify possible weight problems. It provides an estimate of body fat based on height and weight. Your health care provider can find your BMI and can help you achieve or maintain a healthy weight.For adults 20 years and older:  A BMI below 18.5 is considered underweight.  A BMI of 18.5 to 24.9 is normal.  A BMI of 25 to 29.9 is considered overweight.  A  BMI of 30 and above is considered obese.  Maintain normal blood lipids and cholesterol levels by exercising and minimizing your intake of saturated fat. Eat a balanced diet with plenty of fruit and vegetables. Blood tests for lipids and cholesterol should begin at age 45 and be repeated every 5 years. If your lipid or cholesterol levels are high, you are over 50, or you are at high risk for heart disease, you may need your cholesterol levels checked more frequently.Ongoing high lipid and cholesterol levels should be treated with medicines if diet and exercise are not working.  If you smoke, find out from your health care provider how to quit. If you do not use tobacco, do not start.  Lung cancer screening is recommended for adults aged 45-80 years who are at high risk for developing lung cancer because of a history of smoking. A yearly low-dose CT scan of the lungs is recommended for people who have at least a 30-pack-year history of smoking and are a current smoker or have quit within the past 15 years. A pack year of smoking is smoking an average of 1 pack of cigarettes a day for 1 year (for example: 1 pack a day for 30 years or 2 packs a day for 15 years). Yearly screening should continue until the smoker has stopped smoking for at least 15 years. Yearly screening should be stopped for people who develop a health problem that would prevent them from having lung cancer treatment.  If you are pregnant, do not drink alcohol. If you are  breastfeeding, be very cautious about drinking alcohol. If you are not pregnant and choose to drink alcohol, do not have more than 1 drink per day. One drink is considered to be 12 ounces (355 mL) of beer, 5 ounces (148 mL) of wine, or 1.5 ounces (44 mL) of liquor.  Avoid use of street drugs. Do not share needles with anyone. Ask for help if you need support or instructions about stopping the use of drugs.  High blood pressure causes heart disease and increases the risk  of stroke. Your blood pressure should be checked at least every 1 to 2 years. Ongoing high blood pressure should be treated with medicines if weight loss and exercise do not work.  If you are 55-79 years old, ask your health care provider if you should take aspirin to prevent strokes.  Diabetes screening is done by taking a blood sample to check your blood glucose level after you have not eaten for a certain period of time (fasting). If you are not overweight and you do not have risk factors for diabetes, you should be screened once every 3 years starting at age 45. If you are overweight or obese and you are 40-70 years of age, you should be screened for diabetes every year as part of your cardiovascular risk assessment.  Breast cancer screening is essential preventive care for women. You should practice "breast self-awareness." This means understanding the normal appearance and feel of your breasts and may include breast self-examination. Any changes detected, no matter how small, should be reported to a health care provider. Women in their 20s and 30s should have a clinical breast exam (CBE) by a health care provider as part of a regular health exam every 1 to 3 years. After age 40, women should have a CBE every year. Starting at age 40, women should consider having a mammogram (breast X-ray test) every year. Women who have a family history of breast cancer should talk to their health care provider about genetic screening. Women at a high risk of breast cancer should talk to their health care providers about having an MRI and a mammogram every year.  Breast cancer gene (BRCA)-related cancer risk assessment is recommended for women who have family members with BRCA-related cancers. BRCA-related cancers include breast, ovarian, tubal, and peritoneal cancers. Having family members with these cancers may be associated with an increased risk for harmful changes (mutations) in the breast cancer genes BRCA1 and  BRCA2. Results of the assessment will determine the need for genetic counseling and BRCA1 and BRCA2 testing.  Your health care provider may recommend that you be screened regularly for cancer of the pelvic organs (ovaries, uterus, and vagina). This screening involves a pelvic examination, including checking for microscopic changes to the surface of your cervix (Pap test). You may be encouraged to have this screening done every 3 years, beginning at age 21.  For women ages 30-65, health care providers may recommend pelvic exams and Pap testing every 3 years, or they may recommend the Pap and pelvic exam, combined with testing for human papilloma virus (HPV), every 5 years. Some types of HPV increase your risk of cervical cancer. Testing for HPV may also be done on women of any age with unclear Pap test results.  Other health care providers may not recommend any screening for nonpregnant women who are considered low risk for pelvic cancer and who do not have symptoms. Ask your health care provider if a screening pelvic exam is right for   you.  If you have had past treatment for cervical cancer or a condition that could lead to cancer, you need Pap tests and screening for cancer for at least 20 years after your treatment. If Pap tests have been discontinued, your risk factors (such as having a new sexual partner) need to be reassessed to determine if screening should resume. Some women have medical problems that increase the chance of getting cervical cancer. In these cases, your health care provider may recommend more frequent screening and Pap tests.  Colorectal cancer can be detected and often prevented. Most routine colorectal cancer screening begins at the age of 50 years and continues through age 75 years. However, your health care provider may recommend screening at an earlier age if you have risk factors for colon cancer. On a yearly basis, your health care provider may provide home test kits to check  for hidden blood in the stool. Use of a small camera at the end of a tube, to directly examine the colon (sigmoidoscopy or colonoscopy), can detect the earliest forms of colorectal cancer. Talk to your health care provider about this at age 50, when routine screening begins. Direct exam of the colon should be repeated every 5-10 years through age 75 years, unless early forms of precancerous polyps or small growths are found.  People who are at an increased risk for hepatitis B should be screened for this virus. You are considered at high risk for hepatitis B if:  You were born in a country where hepatitis B occurs often. Talk with your health care provider about which countries are considered high risk.  Your parents were born in a high-risk country and you have not received a shot to protect against hepatitis B (hepatitis B vaccine).  You have HIV or AIDS.  You use needles to inject street drugs.  You live with, or have sex with, someone who has hepatitis B.  You get hemodialysis treatment.  You take certain medicines for conditions like cancer, organ transplantation, and autoimmune conditions.  Hepatitis C blood testing is recommended for all people born from 1945 through 1965 and any individual with known risks for hepatitis C.  Practice safe sex. Use condoms and avoid high-risk sexual practices to reduce the spread of sexually transmitted infections (STIs). STIs include gonorrhea, chlamydia, syphilis, trichomonas, herpes, HPV, and human immunodeficiency virus (HIV). Herpes, HIV, and HPV are viral illnesses that have no cure. They can result in disability, cancer, and death.  You should be screened for sexually transmitted illnesses (STIs) including gonorrhea and chlamydia if:  You are sexually active and are younger than 24 years.  You are older than 24 years and your health care provider tells you that you are at risk for this type of infection.  Your sexual activity has changed  since you were last screened and you are at an increased risk for chlamydia or gonorrhea. Ask your health care provider if you are at risk.  If you are at risk of being infected with HIV, it is recommended that you take a prescription medicine daily to prevent HIV infection. This is called preexposure prophylaxis (PrEP). You are considered at risk if:  You are sexually active and do not regularly use condoms or know the HIV status of your partner(s).  You take drugs by injection.  You are sexually active with a partner who has HIV.  Talk with your health care provider about whether you are at high risk of being infected with HIV. If   you choose to begin PrEP, you should first be tested for HIV. You should then be tested every 3 months for as long as you are taking PrEP.  Osteoporosis is a disease in which the bones lose minerals and strength with aging. This can result in serious bone fractures or breaks. The risk of osteoporosis can be identified using a bone density scan. Women ages 67 years and over and women at risk for fractures or osteoporosis should discuss screening with their health care providers. Ask your health care provider whether you should take a calcium supplement or vitamin D to reduce the rate of osteoporosis.  Menopause can be associated with physical symptoms and risks. Hormone replacement therapy is available to decrease symptoms and risks. You should talk to your health care provider about whether hormone replacement therapy is right for you.  Use sunscreen. Apply sunscreen liberally and repeatedly throughout the day. You should seek shade when your shadow is shorter than you. Protect yourself by wearing long sleeves, pants, a wide-brimmed hat, and sunglasses year round, whenever you are outdoors.  Once a month, do a whole body skin exam, using a mirror to look at the skin on your back. Tell your health care provider of new moles, moles that have irregular borders, moles that  are larger than a pencil eraser, or moles that have changed in shape or color.  Stay current with required vaccines (immunizations).  Influenza vaccine. All adults should be immunized every year.  Tetanus, diphtheria, and acellular pertussis (Td, Tdap) vaccine. Pregnant women should receive 1 dose of Tdap vaccine during each pregnancy. The dose should be obtained regardless of the length of time since the last dose. Immunization is preferred during the 27th-36th week of gestation. An adult who has not previously received Tdap or who does not know her vaccine status should receive 1 dose of Tdap. This initial dose should be followed by tetanus and diphtheria toxoids (Td) booster doses every 10 years. Adults with an unknown or incomplete history of completing a 3-dose immunization series with Td-containing vaccines should begin or complete a primary immunization series including a Tdap dose. Adults should receive a Td booster every 10 years.  Varicella vaccine. An adult without evidence of immunity to varicella should receive 2 doses or a second dose if she has previously received 1 dose. Pregnant females who do not have evidence of immunity should receive the first dose after pregnancy. This first dose should be obtained before leaving the health care facility. The second dose should be obtained 4-8 weeks after the first dose.  Human papillomavirus (HPV) vaccine. Females aged 13-26 years who have not received the vaccine previously should obtain the 3-dose series. The vaccine is not recommended for use in pregnant females. However, pregnancy testing is not needed before receiving a dose. If a female is found to be pregnant after receiving a dose, no treatment is needed. In that case, the remaining doses should be delayed until after the pregnancy. Immunization is recommended for any person with an immunocompromised condition through the age of 61 years if she did not get any or all doses earlier. During the  3-dose series, the second dose should be obtained 4-8 weeks after the first dose. The third dose should be obtained 24 weeks after the first dose and 16 weeks after the second dose.  Zoster vaccine. One dose is recommended for adults aged 30 years or older unless certain conditions are present.  Measles, mumps, and rubella (MMR) vaccine. Adults born  before 1957 generally are considered immune to measles and mumps. Adults born in 1957 or later should have 1 or more doses of MMR vaccine unless there is a contraindication to the vaccine or there is laboratory evidence of immunity to each of the three diseases. A routine second dose of MMR vaccine should be obtained at least 28 days after the first dose for students attending postsecondary schools, health care workers, or international travelers. People who received inactivated measles vaccine or an unknown type of measles vaccine during 1963-1967 should receive 2 doses of MMR vaccine. People who received inactivated mumps vaccine or an unknown type of mumps vaccine before 1979 and are at high risk for mumps infection should consider immunization with 2 doses of MMR vaccine. For females of childbearing age, rubella immunity should be determined. If there is no evidence of immunity, females who are not pregnant should be vaccinated. If there is no evidence of immunity, females who are pregnant should delay immunization until after pregnancy. Unvaccinated health care workers born before 1957 who lack laboratory evidence of measles, mumps, or rubella immunity or laboratory confirmation of disease should consider measles and mumps immunization with 2 doses of MMR vaccine or rubella immunization with 1 dose of MMR vaccine.  Pneumococcal 13-valent conjugate (PCV13) vaccine. When indicated, a person who is uncertain of his immunization history and has no record of immunization should receive the PCV13 vaccine. All adults 65 years of age and older should receive this  vaccine. An adult aged 19 years or older who has certain medical conditions and has not been previously immunized should receive 1 dose of PCV13 vaccine. This PCV13 should be followed with a dose of pneumococcal polysaccharide (PPSV23) vaccine. Adults who are at high risk for pneumococcal disease should obtain the PPSV23 vaccine at least 8 weeks after the dose of PCV13 vaccine. Adults older than 56 years of age who have normal immune system function should obtain the PPSV23 vaccine dose at least 1 year after the dose of PCV13 vaccine.  Pneumococcal polysaccharide (PPSV23) vaccine. When PCV13 is also indicated, PCV13 should be obtained first. All adults aged 65 years and older should be immunized. An adult younger than age 65 years who has certain medical conditions should be immunized. Any person who resides in a nursing home or long-term care facility should be immunized. An adult smoker should be immunized. People with an immunocompromised condition and certain other conditions should receive both PCV13 and PPSV23 vaccines. People with human immunodeficiency virus (HIV) infection should be immunized as soon as possible after diagnosis. Immunization during chemotherapy or radiation therapy should be avoided. Routine use of PPSV23 vaccine is not recommended for American Indians, [place] Natives, or people younger than 65 years unless there are medical conditions that require PPSV23 vaccine. When indicated, people who have unknown immunization and have no record of immunization should receive PPSV23 vaccine. One-time revaccination 5 years after the first dose of PPSV23 is recommended for people aged 19-64 years who have chronic kidney failure, nephrotic syndrome, asplenia, or immunocompromised conditions. People who received 1-2 doses of PPSV23 before age 65 years should receive another dose of PPSV23 vaccine at age 65 years or later if at least 5 years have passed since the previous dose. Doses of PPSV23 are not  needed for people immunized with PPSV23 at or after age 65 years.  Meningococcal vaccine. Adults with asplenia or persistent complement component deficiencies should receive 2 doses of quadrivalent meningococcal conjugate (MenACWY-D) vaccine. The doses should be obtained   at least 2 months apart. Microbiologists working with certain meningococcal bacteria, [place] recruits, people at risk during an outbreak, and people who travel to or live in countries with a high rate of meningitis should be immunized. A first-year college student up through age 34 years who is living in a residence hall should receive a dose if she did not receive a dose on or after her 16th birthday. Adults who have certain high-risk conditions should receive one or more doses of vaccine.  Hepatitis A vaccine. Adults who wish to be protected from this disease, have certain high-risk conditions, work with hepatitis A-infected animals, work in hepatitis A research labs, or travel to or work in countries with a high rate of hepatitis A should be immunized. Adults who were previously unvaccinated and who anticipate close contact with an international adoptee during the first 60 days after arrival in the [place] from a country with a high rate of hepatitis A should be immunized.  Hepatitis B vaccine. Adults who wish to be protected from this disease, have certain high-risk conditions, may be exposed to blood or other infectious body fluids, are household contacts or sex partners of hepatitis B positive people, are clients or workers in certain care facilities, or travel to or work in countries with a high rate of hepatitis B should be immunized.  Haemophilus influenzae type b (Hib) vaccine. A previously unvaccinated person with asplenia or sickle cell disease or having a scheduled splenectomy should receive 1 dose of Hib vaccine. Regardless of previous immunization, a recipient of a hematopoietic stem cell transplant should receive a  3-dose series 6-12 months after her successful transplant. Hib vaccine is not recommended for adults with HIV infection. Preventive Services / Frequency Ages 35 to 4 years  Blood pressure check.** / Every 3-5 years.  Lipid and cholesterol check.** / Every 5 years beginning at age 60.  Clinical breast exam.** / Every 3 years for women in their 71s and 10s.  BRCA-related cancer risk assessment.** / For women who have family members with a BRCA-related cancer (breast, ovarian, tubal, or peritoneal cancers).  Pap test.** / Every 2 years from ages 76 through 26. Every 3 years starting at age 61 through age 76 or 93 with a history of 3 consecutive normal Pap tests.  HPV screening.** / Every 3 years from ages 37 through ages 60 to 51 with a history of 3 consecutive normal Pap tests.  Hepatitis C blood test.** / For any individual with known risks for hepatitis C.  Skin self-exam. / Monthly.  Influenza vaccine. / Every year.  Tetanus, diphtheria, and acellular pertussis (Tdap, Td) vaccine.** / Consult your health care provider. Pregnant women should receive 1 dose of Tdap vaccine during each pregnancy. 1 dose of Td every 10 years.  Varicella vaccine.** / Consult your health care provider. Pregnant females who do not have evidence of immunity should receive the first dose after pregnancy.  HPV vaccine. / 3 doses over 6 months, if 93 and younger. The vaccine is not recommended for use in pregnant females. However, pregnancy testing is not needed before receiving a dose.  Measles, mumps, rubella (MMR) vaccine.** / You need at least 1 dose of MMR if you were born in 1957 or later. You may also need a 2nd dose. For females of childbearing age, rubella immunity should be determined. If there is no evidence of immunity, females who are not pregnant should be vaccinated. If there is no evidence of immunity, females who are  pregnant should delay immunization until after pregnancy.  Pneumococcal  13-valent conjugate (PCV13) vaccine.** / Consult your health care provider.  Pneumococcal polysaccharide (PPSV23) vaccine.** / 1 to 2 doses if you smoke cigarettes or if you have certain conditions.  Meningococcal vaccine.** / 1 dose if you are age 68 to 8 years and a Market researcher living in a residence hall, or have one of several medical conditions, you need to get vaccinated against meningococcal disease. You may also need additional booster doses.  Hepatitis A vaccine.** / Consult your health care provider.  Hepatitis B vaccine.** / Consult your health care provider.  Haemophilus influenzae type b (Hib) vaccine.** / Consult your health care provider. Ages 7 to 53 years  Blood pressure check.** / Every year.  Lipid and cholesterol check.** / Every 5 years beginning at age 25 years.  Lung cancer screening. / Every year if you are aged 11-80 years and have a 30-pack-year history of smoking and currently smoke or have quit within the past 15 years. Yearly screening is stopped once you have quit smoking for at least 15 years or develop a health problem that would prevent you from having lung cancer treatment.  Clinical breast exam.** / Every year after age 48 years.  BRCA-related cancer risk assessment.** / For women who have family members with a BRCA-related cancer (breast, ovarian, tubal, or peritoneal cancers).  Mammogram.** / Every year beginning at age 41 years and continuing for as long as you are in good health. Consult with your health care provider.  Pap test.** / Every 3 years starting at age 65 years through age 37 or 70 years with a history of 3 consecutive normal Pap tests.  HPV screening.** / Every 3 years from ages 72 years through ages 60 to 40 years with a history of 3 consecutive normal Pap tests.  Fecal occult blood test (FOBT) of stool. / Every year beginning at age 21 years and continuing until age 5 years. You may not need to do this test if you get  a colonoscopy every 10 years.  Flexible sigmoidoscopy or colonoscopy.** / Every 5 years for a flexible sigmoidoscopy or every 10 years for a colonoscopy beginning at age 35 years and continuing until age 48 years.  Hepatitis C blood test.** / For all people born from [birth year] through 1965 and any individual with known risks for hepatitis C.  Skin self-exam. / Monthly.  Influenza vaccine. / Every year.  Tetanus, diphtheria, and acellular pertussis (Tdap/Td) vaccine.** / Consult your health care provider. Pregnant women should receive 1 dose of Tdap vaccine during each pregnancy. 1 dose of Td every 10 years.  Varicella vaccine.** / Consult your health care provider. Pregnant females who do not have evidence of immunity should receive the first dose after pregnancy.  Zoster vaccine.** / 1 dose for adults aged 30 years or older.  Measles, mumps, rubella (MMR) vaccine.** / You need at least 1 dose of MMR if you were born in 1957 or later. You may also need a second dose. For females of childbearing age, rubella immunity should be determined. If there is no evidence of immunity, females who are not pregnant should be vaccinated. If there is no evidence of immunity, females who are pregnant should delay immunization until after pregnancy.  Pneumococcal 13-valent conjugate (PCV13) vaccine.** / Consult your health care provider.  Pneumococcal polysaccharide (PPSV23) vaccine.** / 1 to 2 doses if you smoke cigarettes or if you have certain conditions.  Meningococcal vaccine.** /  Consult your health care provider.  Hepatitis A vaccine.** / Consult your health care provider.  Hepatitis B vaccine.** / Consult your health care provider.  Haemophilus influenzae type b (Hib) vaccine.** / Consult your health care provider. Ages 64 years and over  Blood pressure check.** / Every year.  Lipid and cholesterol check.** / Every 5 years beginning at age 23 years.  Lung cancer screening. / Every year if you  are aged 16-80 years and have a 30-pack-year history of smoking and currently smoke or have quit within the past 15 years. Yearly screening is stopped once you have quit smoking for at least 15 years or develop a health problem that would prevent you from having lung cancer treatment.  Clinical breast exam.** / Every year after age 74 years.  BRCA-related cancer risk assessment.** / For women who have family members with a BRCA-related cancer (breast, ovarian, tubal, or peritoneal cancers).  Mammogram.** / Every year beginning at age 44 years and continuing for as long as you are in good health. Consult with your health care provider.  Pap test.** / Every 3 years starting at age 58 years through age 22 or 39 years with 3 consecutive normal Pap tests. Testing can be stopped between 65 and 70 years with 3 consecutive normal Pap tests and no abnormal Pap or HPV tests in the past 10 years.  HPV screening.** / Every 3 years from ages 64 years through ages 70 or 61 years with a history of 3 consecutive normal Pap tests. Testing can be stopped between 65 and 70 years with 3 consecutive normal Pap tests and no abnormal Pap or HPV tests in the past 10 years.  Fecal occult blood test (FOBT) of stool. / Every year beginning at age 40 years and continuing until age 27 years. You may not need to do this test if you get a colonoscopy every 10 years.  Flexible sigmoidoscopy or colonoscopy.** / Every 5 years for a flexible sigmoidoscopy or every 10 years for a colonoscopy beginning at age 7 years and continuing until age 32 years.  Hepatitis C blood test.** / For all people born from [birth year] through 1965 and any individual with known risks for hepatitis C.  Osteoporosis screening.** / A one-time screening for women ages 30 years and over and women at risk for fractures or osteoporosis.  Skin self-exam. / Monthly.  Influenza vaccine. / Every year.  Tetanus, diphtheria, and acellular pertussis (Tdap/Td)  vaccine.** / 1 dose of Td every 10 years.  Varicella vaccine.** / Consult your health care provider.  Zoster vaccine.** / 1 dose for adults aged 35 years or older.  Pneumococcal 13-valent conjugate (PCV13) vaccine.** / Consult your health care provider.  Pneumococcal polysaccharide (PPSV23) vaccine.** / 1 dose for all adults aged 46 years and older.  Meningococcal vaccine.** / Consult your health care provider.  Hepatitis A vaccine.** / Consult your health care provider.  Hepatitis B vaccine.** / Consult your health care provider.  Haemophilus influenzae type b (Hib) vaccine.** / Consult your health care provider. ** Family history and personal history of risk and conditions may change your health care provider's recommendations.   This information is not intended to replace advice given to you by your health care provider. Make sure you discuss any questions you have with your health care provider.   Document Released: 06/03/2001 Document Revised: 04/28/2014 Document Reviewed: 09/02/2010 Elsevier Interactive Patient Education [employer].

## 2015-05-25 NOTE — Progress Notes (Signed)
Subjective:     Heather [name] is a 55 y.o. female and is here for a comprehensive physical exam. The patient reports no problems.  Social History   Social History  . Marital Status: Single    Spouse Name: N/A  . Number of Children: N/A  . Years of Education: 20   Occupational History  . [employer]manage lab    Social History Main Topics  . Smoking status: Never Smoker   . Smokeless tobacco: Never Used  . Alcohol Use: 0.0 oz/week    0 Standard drinks or equivalent per week     Comment: occ  . Drug Use: No  . Sexual Activity: Yes   Other Topics Concern  . Not on file   Social History Narrative   Exercise-- no   Health Maintenance  Topic Date Due  . Hepatitis C Screening  [birth year]  . PNEUMOCOCCAL POLYSACCHARIDE VACCINE (1) 07/08/1961  . HIV Screening  07/09/1974  . HEMOGLOBIN A1C  09/07/2014  . INFLUENZA VACCINE  11/20/2015  . PAP SMEAR  01/08/2016  . OPHTHALMOLOGY EXAM  01/20/2016  . FOOT EXAM  05/24/2016  . MAMMOGRAM  06/18/2016  . TETANUS/TDAP  07/25/2018  . COLONOSCOPY  12/07/2019    The following portions of the patient's history were reviewed and updated as appropriate:  She  has a past medical history of Allergic rhinitis; Hyperlipidemia; Hypertension; Glaucoma; Acute bronchitis; Allergy; Anemia; and Arthritis. She  does not have any pertinent problems on file. She  has past surgical history that includes Abdominal hysterectomy (1998); Breast lumpectomy; and Cesarean section. Her family history includes Alzheimer's disease in her father; Atrial fibrillation in her father; Cancer in her maternal grandfather, maternal grandmother, and paternal grandfather; Cancer (age of onset: 60) in her father; Cancer - Colon (age of onset: 66) in her brother; Colon cancer in her brother; Colon cancer (age of onset: 24) in her cousin; Dementia in her father; Diabetes in her maternal aunt and maternal uncle; Hyperlipidemia in her brother and brother;  Hypertension in her brother, brother, and brother; Kidney disease in her maternal uncle; Pulmonary embolism in her brother; Stroke in her father. She  reports that she has never smoked. She has never used smokeless tobacco. She reports that she drinks alcohol. She reports that she does not use illicit drugs. She has a current medication list which includes the following prescription(s): brimonidine-timolol, ferrous sulfate dried, fluticasone, furosemide, lactobacillus, lisinopril, multivitamin, and pravastatin. Current Outpatient Prescriptions on File Prior to Visit  Medication Sig Dispense Refill  . brimonidine-timolol (COMBIGAN) 0.2-0.5 % ophthalmic solution Place 1 drop into both eyes every 12 (twelve) hours.      . Ferrous Sulfate Dried (GNP IRON) 200 (65 FE) MG TABS Take 65 mg by mouth every other day.    . fluticasone (FLONASE) 50 MCG/ACT nasal spray Place 2 sprays into both nostrils daily. 16 g 1  . Multiple Vitamin (MULTIVITAMIN) tablet Take 1 tablet by mouth daily.       No current facility-administered medications on file prior to visit.   She is allergic to tamiflu..  Review of Systems Review of Systems  Constitutional: Negative for activity change, appetite change and fatigue.  HENT: Negative for hearing loss, congestion, tinnitus and ear discharge.  dentist [id number]: Negative for visual disturbance (see optho q1y -- vision corrected to 20/20 with glasses).  Respiratory: Negative for cough, chest tightness and shortness of breath.   Cardiovascular: Negative for chest pain, palpitations and leg swelling.  Gastrointestinal: Negative  for abdominal pain, diarrhea, constipation and abdominal distention.  Genitourinary: Negative for urgency, frequency, decreased urine volume and difficulty urinating.  Musculoskeletal: Negative for back pain, arthralgias and gait problem.  Skin: Negative for color change, pallor and rash.  Neurological: Negative for dizziness, light-headedness, numbness  and headaches.  Hematological: Negative for adenopathy. Does not bruise/bleed easily.  Psychiatric/Behavioral: Negative for suicidal ideas, confusion, sleep disturbance, self-injury, dysphoric mood, decreased concentration and agitation.       Objective:    BP 128/74 mmHg  Pulse 74  Temp(Src) 98.2 F (36.8 C) (Oral)  Ht 5' 6"  (1.676 m)  Wt 249 lb 3.2 oz (113.036 kg)  BMI 40.24 kg/m2  SpO2 98% General appearance: alert, cooperative, appears stated age and no distress Head: Normocephalic, without obvious abnormality, atraumatic Eyes: conjunctivae/corneas clear. PERRL, EOM's intact. Fundi benign. Ears: normal TM's and external ear canals both ears Nose: Nares normal. Septum midline. Mucosa normal. No drainage or sinus tenderness. Throat: lips, mucosa, and tongue normal; teeth and gums normal Neck: no adenopathy, no carotid bruit, no JVD, supple, symmetrical, trachea midline and thyroid not enlarged, symmetric, no tenderness/mass/nodules Back: symmetric, no curvature. ROM normal. No CVA tenderness. Lungs: clear to auscultation bilaterally Breasts: normal appearance, no masses or tenderness Heart: regular rate and rhythm, S1, S2 normal, no murmur, click, rub or gallop Abdomen: soft, non-tender; bowel sounds normal; no masses,  no organomegaly Pelvic: not indicated; status post hysterectomy, negative ROS Extremities: extremities normal, atraumatic, no cyanosis or edema Pulses: 2+ and symmetric Skin: Skin color, texture, turgor normal. No rashes or lesions Lymph nodes: Cervical, supraclavicular, and axillary nodes normal. Neurologic: Alert and oriented X 3, normal strength and tone. Normal symmetric reflexes. Normal coordination and gait Psych- no anxiety, no depression      Assessment:    Healthy female exam.      Plan:    ghm utd Check labs See After Visit Summary for Counseling Recommendations    1. Ganglion cyst of flexor tendon sheath of finger of right hand  -  Ambulatory referral to Orthopedic Surgery  2. Essential hypertension  Current outpatient prescriptions:  .  brimonidine-timolol (COMBIGAN) 0.2-0.5 % ophthalmic solution, Place 1 drop into both eyes every 12 (twelve) hours.  , Disp: , Rfl:  .  Ferrous Sulfate Dried (GNP IRON) 200 (65 FE) MG TABS, Take 65 mg by mouth every other day., Disp: , Rfl:  .  fluticasone (FLONASE) 50 MCG/ACT nasal spray, Place 2 sprays into both nostrils daily., Disp: 16 g, Rfl: 1 .  furosemide (LASIX) 20 MG tablet, Take 1 tablet (20 mg total) by mouth daily., Disp: 90 tablet, Rfl: 0 .  Lactobacillus (PROBIOTIC ACIDOPHILUS PO), Take by mouth., Disp: , Rfl:  .  lisinopril (PRINIVIL,ZESTRIL) 10 MG tablet, Take 1 tablet (10 mg total) by mouth daily., Disp: 90 tablet, Rfl: 3 .  Multiple Vitamin (MULTIVITAMIN) tablet, Take 1 tablet by mouth daily.  , Disp: , Rfl:  .  pravastatin (PRAVACHOL) 20 MG tablet, Take 1 tablet (20 mg total) by mouth daily., Disp: 90 tablet, Rfl: 0  - furosemide (LASIX) 20 MG tablet; Take 1 tablet (20 mg total) by mouth daily.  Dispense: 90 tablet; Refill: 0 - lisinopril (PRINIVIL,ZESTRIL) 10 MG tablet; Take 1 tablet (10 mg total) by mouth daily.  Dispense: 90 tablet; Refill: 3 - Hepatitis C antibody - CBC with Differential/Platelet - Hepatic function panel - Lipid panel - POCT urinalysis dipstick - TSH - Comp Met (CMET)  3. Hyperlipidemia Check labs - pravastatin (PRAVACHOL)  20 MG tablet; Take 1 tablet (20 mg total) by mouth daily.  Dispense: 90 tablet; Refill: 0 - CBC with Differential/Platelet - Hepatic function panel - Lipid panel - POCT urinalysis dipstick - TSH - Comp Met (CMET)  4. Preventative health care   - Hepatitis C antibody - CBC with Differential/Platelet - Hepatic function panel - Lipid panel - POCT urinalysis dipstick - TSH - Comp Met (CMET)  5. Need for hepatitis C screening test   - Hepatitis C antibody

## 2015-05-26 LAB — TSH: TSH: 1.053 u[IU]/mL (ref 0.350–4.500)

## 2015-05-26 LAB — HEPATITIS C ANTIBODY: HCV AB: NEGATIVE

## 2015-05-28 NOTE — Addendum Note (Signed)
Addended by: Ewing Schlein on: 05/28/2015 10:00 AM   Modules accepted: Orders

## 2015-06-14 ENCOUNTER — Encounter: Payer: Self-pay | Admitting: Medical

## 2015-06-14 ENCOUNTER — Ambulatory Visit (INDEPENDENT_AMBULATORY_CARE_PROVIDER_SITE_OTHER): Payer: 59 | Admitting: Medical

## 2015-06-14 VITALS — BP 124/80 | HR 62 | Temp 98.1°F | Ht 66.0 in | Wt 253.4 lb

## 2015-06-14 DIAGNOSIS — J029 Acute pharyngitis, unspecified: Secondary | ICD-10-CM

## 2015-06-14 DIAGNOSIS — H6692 Otitis media, unspecified, left ear: Secondary | ICD-10-CM

## 2015-06-14 DIAGNOSIS — J309 Allergic rhinitis, unspecified: Secondary | ICD-10-CM | POA: Diagnosis not present

## 2015-06-14 MED ORDER — LEVOCETIRIZINE DIHYDROCHLORIDE 5 MG PO TABS: 5.0000 mg | ORAL_TABLET | Freq: Every evening | ORAL | Status: DC

## 2015-06-14 MED ORDER — CEFDINIR 300 MG PO CAPS: 300.0000 mg | ORAL_CAPSULE | Freq: Two times a day (BID) | ORAL | Status: DC

## 2015-06-14 MED FILL — LEVOCETIRIZINE 5 MG TABLET: 5 | 30 days supply | Qty: 30 | Fill #0

## 2015-06-14 MED FILL — CEFDINIR 300 MG CAPSULE: 300 | 10 days supply | Qty: 20 | Fill #0

## 2015-06-14 NOTE — Progress Notes (Signed)
Subjective:    Patient ID: Heather [name], female    DOB: [birth year], 55 y.o.   MRN: [id number]  HPI  Pt in with nasal congestion, runny nose, sinus pain, submandibular area pain, mild st and left ear pain.(symptoms for one week).  Sneezing occasional but no itching eyes.  Pt has flonase rx but has not been using it for some time.      Review of Systems  Constitutional: Negative for fever, chills and fatigue.  HENT: Positive for congestion, ear pain and sinus pressure.        Lt ear pain enough last night that disturbed her sleep.  Respiratory: Negative for cough, choking, shortness of breath and wheezing.   Cardiovascular: Negative for chest pain and palpitations.  Gastrointestinal: Negative for abdominal pain.  Hematological: Positive for adenopathy.  Psychiatric/Behavioral: Negative for behavioral problems and confusion.    Past Medical History  Diagnosis Date  . Allergic rhinitis   . Hyperlipidemia   . Hypertension   . Glaucoma   . Acute bronchitis   . Allergy   . Anemia   . Arthritis     knees    Social History   Social History  . Marital Status: Single    Spouse Name: N/A  . Number of Children: N/A  . Years of Education: 20   Occupational History  . [employer]--manage lab    Social History Main Topics  . Smoking status: Never Smoker   . Smokeless tobacco: Never Used  . Alcohol Use: 0.0 oz/week    0 Standard drinks or equivalent per week     Comment: occ  . Drug Use: No  . Sexual Activity: Yes   Other Topics Concern  . Not on file   Social History Narrative   Exercise-- no    Past Surgical History  Procedure Laterality Date  . Abdominal hysterectomy  1998  . Breast lumpectomy    . Cesarean section      Family History  Problem Relation Age of Onset  . Endometrial cancer    . Atrial fibrillation    . Dementia Father   . Stroke Father   . Cancer Father 80    prostate  . Atrial fibrillation Father   . Alzheimer's  disease Father   . Hyperlipidemia Brother   . Hypertension Brother   . Diabetes Maternal Aunt   . Diabetes Maternal Uncle   . Kidney disease Maternal Uncle     dialysis  . Cancer Maternal Grandmother     uterine  . Cancer Maternal Grandfather   . Cancer Paternal Grandfather     pancreatic  . Hyperlipidemia Brother   . Hypertension Brother   . Hypertension Brother   . Pulmonary embolism Brother   . Cancer - Colon Brother 60    colon CA--stage 4  . Colon cancer Brother   . Colon cancer Cousin 99    Allergies  Allergen Reactions  . Tamiflu     Current Outpatient Prescriptions on File Prior to Visit  Medication Sig Dispense Refill  . brimonidine-timolol (COMBIGAN) 0.2-0.5 % ophthalmic solution Place 1 drop into both eyes every 12 (twelve) hours.      . Ferrous Sulfate Dried (GNP IRON) 200 (65 FE) MG TABS Take 65 mg by mouth every other day.    . fluticasone (FLONASE) 50 MCG/ACT nasal spray Place 2 sprays into both nostrils daily. 16 g 1  . furosemide (LASIX) 20 MG tablet Take 1 tablet (20 mg total) by  mouth daily. 90 tablet 0  . Lactobacillus (PROBIOTIC ACIDOPHILUS PO) Take by mouth.    Marland Kitchen lisinopril (PRINIVIL,ZESTRIL) 10 MG tablet Take 1 tablet (10 mg total) by mouth daily. 90 tablet 3  . Multiple Vitamin (MULTIVITAMIN) tablet Take 1 tablet by mouth daily.      . pravastatin (PRAVACHOL) 20 MG tablet Take 1 tablet (20 mg total) by mouth daily. 90 tablet 0   No current facility-administered medications on file prior to visit.    BP 124/80 mmHg  Pulse 62  Temp(Src) 98.1 F (36.7 C) (Oral)  Ht 5\' 6"  (1.676 m)  Wt 253 lb 6.4 oz (114.941 kg)  BMI 40.92 kg/m2  SpO2 100%      Objective:   Physical Exam  General  Mental Status - Alert. General Appearance - Well groomed. Not in acute distress.  Skin Rashes- No Rashes.  HEENT Head- Normal. Ear Auditory Canal - Left- Normal. Right - Normal.Tympanic Membrane- Left- moderate dull appearance.. Right- Normal. Eye  Sclera/Conjunctiva- Left- Normal. Right- Normal. Nose & Sinuses Nasal Mucosa- Left-  Boggy and Congested. Right-  Boggy and  Congested.Bilateral  No maxillary and  No frontal sinus pressure. Mouth & Throat Lips: Upper Lip- Normal: no dryness, cracking, pallor, cyanosis, or vesicular eruption. Lower Lip-Normal: no dryness, cracking, pallor, cyanosis or vesicular eruption. Buccal Mucosa- Bilateral- No Aphthous ulcers. Oropharynx- No Discharge or Erythema. Tonsils: Characteristics- Bilateral- moderate  Erythema and  Congestion. Size/Enlargement- Bilateral- 1+  enlargement. Discharge- bilateral-None.  Neck Neck- Supple. No Masses.   Chest and Lung Exam Auscultation: Breath Sounds:-Clear even and unlabored.  Cardiovascular Auscultation:Rythm- Regular, rate and rhythm. Murmurs & Other Heart Sounds:Ausculatation of the heart reveal- No Murmurs.  Lymphatic Head & Neck General Head & Neck Lymphatics: Bilateral: Description- faint enlarged and tender submandibular nodes.       Assessment & Plan:  For recent allergic symptoms start flonase spray. Rx xyxzal.  For your lt ear which looks early infected with moderate-severe pain start cefdnir.  For mild soar throat this may be allergy related or bacterial. Both above treatment should help.  Follow up in 7 days or as needed

## 2015-06-14 NOTE — Progress Notes (Signed)
Pre visit review using our clinic review tool, if applicable. No additional management support is needed unless otherwise documented below in the visit note. 

## 2015-06-14 NOTE — Patient Instructions (Signed)
For recent allergic symptoms start flonase spray. Rx xyxzal.  For your lt ear which looks early infected with moderate-severe pain start cefdnir.  For mild soar throat this may be allergy related or bacterial. Both above treatment should help.  Follow up in 7 days or as needed

## 2015-06-21 LAB — HM MAMMOGRAPHY: HM MAMMO: NORMAL

## 2015-06-27 ENCOUNTER — Encounter: Payer: Self-pay | Admitting: Family Medicine

## 2015-08-21 ENCOUNTER — Other Ambulatory Visit: Payer: Self-pay | Admitting: Family Medicine

## 2015-09-06 MED FILL — LEVOCETIRIZINE 5 MG TABLET: 5 | 30 days supply | Qty: 30 | Fill #1

## 2015-11-19 ENCOUNTER — Other Ambulatory Visit: Payer: Self-pay | Admitting: Family Medicine

## 2015-11-19 DIAGNOSIS — E785 Hyperlipidemia, unspecified: Secondary | ICD-10-CM

## 2015-11-19 MED FILL — LEVOCETIRIZINE 5 MG TABLET: 5 | 30 days supply | Qty: 30 | Fill #2

## 2016-02-06 ENCOUNTER — Other Ambulatory Visit: Payer: Self-pay | Admitting: Family Medicine

## 2016-03-05 ENCOUNTER — Other Ambulatory Visit: Payer: Self-pay | Admitting: Family Medicine

## 2016-05-24 DIAGNOSIS — J018 Other acute sinusitis: Secondary | ICD-10-CM | POA: Diagnosis not present

## 2016-05-29 ENCOUNTER — Encounter: Payer: 59 | Admitting: Family Medicine

## 2016-05-30 ENCOUNTER — Ambulatory Visit (HOSPITAL_BASED_OUTPATIENT_CLINIC_OR_DEPARTMENT_OTHER)
Admission: RE | Admit: 2016-05-30 | Discharge: 2016-05-30 | Disposition: A | Discharge status: Home / Self Care | Payer: 59 | Source: Ambulatory Visit | Attending: Family Medicine | Admitting: Family Medicine

## 2016-05-30 ENCOUNTER — Encounter: Payer: Self-pay | Admitting: Family Medicine

## 2016-05-30 ENCOUNTER — Ambulatory Visit (INDEPENDENT_AMBULATORY_CARE_PROVIDER_SITE_OTHER): Payer: 59 | Admitting: Family Medicine

## 2016-05-30 VITALS — BP 112/58 | HR 97 | Temp 101.0°F | Resp 16 | Ht 66.0 in | Wt 268.2 lb

## 2016-05-30 DIAGNOSIS — R509 Fever, unspecified: Secondary | ICD-10-CM

## 2016-05-30 DIAGNOSIS — R059 Cough, unspecified: Secondary | ICD-10-CM

## 2016-05-30 DIAGNOSIS — R05 Cough: Secondary | ICD-10-CM | POA: Insufficient documentation

## 2016-05-30 DIAGNOSIS — J189 Pneumonia, unspecified organism: Secondary | ICD-10-CM

## 2016-05-30 DIAGNOSIS — R0602 Shortness of breath: Secondary | ICD-10-CM | POA: Diagnosis not present

## 2016-05-30 LAB — POC INFLUENZA A&B (BINAX/QUICKVUE)
INFLUENZA B, POC: NEGATIVE
Influenza A, POC: NEGATIVE

## 2016-05-30 MED ORDER — LEVOCETIRIZINE DIHYDROCHLORIDE 5 MG PO TABS: 5.0000 mg | ORAL_TABLET | Freq: Every evening | ORAL | 3 refills | Status: DC

## 2016-05-30 MED ORDER — CEFTRIAXONE SODIUM 1 G IJ SOLR
1.0000 g | Freq: Once | INTRAMUSCULAR | Status: AC
  Administered 2016-05-30: 1 g via INTRAMUSCULAR

## 2016-05-30 MED ORDER — LEVOFLOXACIN 500 MG PO TABS: 500.0000 mg | ORAL_TABLET | Freq: Every day | ORAL | 0 refills | Status: DC

## 2016-05-30 MED FILL — LEVOCETIRIZINE 5 MG TABLET: 5 | 30 days supply | Qty: 30 | Fill #0 | Status: TO

## 2016-05-30 MED FILL — levoFLOXacin 500 MG TABS: 500 | 10 days supply | Qty: 10 | Fill #0

## 2016-05-30 NOTE — Progress Notes (Signed)
Pre visit review using our clinic review tool, if applicable. No additional management support is needed unless otherwise documented below in the visit note. 

## 2016-05-30 NOTE — Progress Notes (Signed)
Patient ID: Heather [name], female   DOB: 03/17/1961, 56 y.o.   MRN: [id number]   I acted as a Education administrator for Dr. Carollee Herter.  Guerry Bruin, CMA    Subjective:    Patient ID: Heather [name], female    DOB: 06/14/1960, 56 y.o.   MRN: [id number]  Chief Complaint  Patient presents with  . Cough    dry cough,  feels heaviness in the chest like some mucous will not come up  . Sinusitis    Cough  This is a new problem. The current episode started in the past 7 days. The problem has been unchanged. The cough is non-productive. Associated symptoms include chills, ear congestion, a fever, headaches, heartburn, nasal congestion, rhinorrhea and shortness of breath. Pertinent negatives include no chest pain, ear pain, hemoptysis, myalgias, postnasal drip, rash, sore throat, sweats, weight loss or wheezing. Associated symptoms comments: Fatigue and chest heaviness, feels like something in chest but will not come up.  Both ears feels congested.. She has tried nothing for the symptoms.  Sinusitis  This is a recurrent problem. The current episode started in the past 7 days. The problem is unchanged. Associated symptoms include chills, coughing, headaches and shortness of breath. Pertinent negatives include no ear pain or sore throat. Past treatments include antibiotics. The treatment provided no relief.    Patient is in today for cough and sinusitis.   Did a virtual e visit on computer and was prescribed Augmentin.  All symptoms started Saturday with sinus pressure and pain in the back of head.  Now has dry cough and sinus pressure is unchanged.   Past Medical History:  Diagnosis Date  . Acute bronchitis   . Allergic rhinitis   . Allergy   . Anemia   . Arthritis    knees  . Glaucoma   . Hyperlipidemia   . Hypertension     Past Surgical History:  Procedure Laterality Date  . ABDOMINAL HYSTERECTOMY  1998  . BREAST LUMPECTOMY    . CESAREAN SECTION      Family History  Problem Relation Age of  Onset  . Endometrial cancer    . Atrial fibrillation    . Dementia Father   . Stroke Father   . Cancer Father 3    prostate  . Atrial fibrillation Father   . Alzheimer's disease Father   . Hyperlipidemia Brother   . Hypertension Brother   . Diabetes Maternal Aunt   . Diabetes Maternal Uncle   . Kidney disease Maternal Uncle     dialysis  . Cancer Maternal Grandmother     uterine  . Cancer Maternal Grandfather   . Cancer Paternal Grandfather     pancreatic  . Hyperlipidemia Brother   . Hypertension Brother   . Hypertension Brother   . Pulmonary embolism Brother   . Cancer - Colon Brother 60    colon CA--stage 4  . Colon cancer Brother   . Colon cancer Cousin 58    Social History   Social History  . Marital status: Single    Spouse name: N/A  . Number of children: N/A  . Years of education: 17   Occupational History  . [employer]manage lab    Social History Main Topics  . Smoking status: Never Smoker  . Smokeless tobacco: Never Used  . Alcohol use 0.0 oz/week     Comment: occ  . Drug use: No  . Sexual activity: Yes   Other Topics Concern  . Not  on file   Social History Narrative   Exercise-- no    Outpatient Medications Prior to Visit  Medication Sig Dispense Refill  . Ferrous Sulfate Dried (GNP IRON) 200 (65 FE) MG TABS Take 65 mg by mouth every other day.    . fluticasone (FLONASE) 50 MCG/ACT nasal spray Place 2 sprays into both nostrils daily. 16 g 1  . furosemide (LASIX) 20 MG tablet TAKE ONE TABLET BY MOUTH ONCE DAILY 90 tablet 0  . Lactobacillus (PROBIOTIC ACIDOPHILUS PO) Take by mouth.    Marland Kitchen lisinopril (PRINIVIL,ZESTRIL) 10 MG tablet Take 1 tablet (10 mg total) by mouth daily. 90 tablet 3  . Multiple Vitamin (MULTIVITAMIN) tablet Take 1 tablet by mouth daily.      . pravastatin (PRAVACHOL) 20 MG tablet Take 1 tablet (20 mg total) by mouth daily. Repeat labs are due now 30 tablet 0  . pravastatin (PRAVACHOL) 20 MG tablet TAKE ONE  TABLET BY MOUTH ONCE DAILY REPEAT  LABS  ARE  DUE  NOW 90 tablet 0  . levocetirizine (XYZAL) 5 MG tablet Take 1 tablet (5 mg total) by mouth every evening. 30 tablet 3  . brimonidine-timolol (COMBIGAN) 0.2-0.5 % ophthalmic solution Place 1 drop into both eyes every 12 (twelve) hours.      . cefdinir (OMNICEF) 300 MG capsule Take 1 capsule (300 mg total) by mouth 2 (two) times daily. 20 capsule 0   No facility-administered medications prior to visit.     Allergies  Allergen Reactions  . Tamiflu     Review of Systems  Constitutional: Positive for chills and fever. Negative for weight loss.  HENT: Positive for rhinorrhea. Negative for ear pain, postnasal drip and sore throat.   Respiratory: Positive for cough and shortness of breath. Negative for hemoptysis and wheezing.   Cardiovascular: Negative for chest pain.  Gastrointestinal: Positive for heartburn.  Musculoskeletal: Negative for myalgias.  Skin: Negative for rash.  Neurological: Positive for headaches.       Objective:    Physical Exam  Constitutional: She appears well-developed and well-nourished. No distress.  HENT:  Head: Normocephalic and atraumatic.  Right Ear: Tympanic membrane normal.  Left Ear: Tympanic membrane normal.  Nose: Mucosal edema and rhinorrhea present. Right sinus exhibits maxillary sinus tenderness and frontal sinus tenderness. Left sinus exhibits maxillary sinus tenderness and frontal sinus tenderness.  Mouth/Throat: Uvula is midline and mucous membranes are normal. Posterior oropharyngeal erythema present. No oropharyngeal exudate.  Eyes: Conjunctivae and EOM are normal. Pupils are equal, round, and reactive to light.  Neck: Normal range of motion. Neck supple.  Cardiovascular: Normal rate, regular rhythm and normal heart sounds.   Pulmonary/Chest: Effort normal and breath sounds normal. No respiratory distress. She has no wheezes.  Lymphadenopathy:    She has no cervical adenopathy.  Nursing note  and vitals reviewed.   BP (!) 112/58 (BP Location: Left Arm, Cuff Size: Large)   Pulse 97   Temp (!) 101 F (38.3 C) (Oral)   Resp 16   Ht 5\' 6"  (1.676 m)   Wt 268 lb 3.2 oz (121.7 kg)   SpO2 97%   BMI 43.29 kg/m  Wt Readings from Last 3 Encounters:  05/30/16 268 lb 3.2 oz (121.7 kg)  06/14/15 253 lb 6.4 oz (114.9 kg)  05/25/15 249 lb 3.2 oz (113 kg)     Lab Results  Component Value Date   WBC 5.7 05/25/2015   HGB 13.8 05/25/2015   HCT 42.8 05/25/2015   PLT 203 05/25/2015  GLUCOSE 97 05/25/2015   CHOL 257 (H) 05/25/2015   TRIG 66 05/25/2015   HDL 71 05/25/2015   LDLDIRECT 128.0 01/03/2013   LDLCALC 173 (H) 05/25/2015   ALT 21 05/25/2015   ALT 21 05/25/2015   AST 21 05/25/2015   AST 21 05/25/2015   NA 140 05/25/2015   K 4.0 05/25/2015   CL 99 05/25/2015   CREATININE 0.68 05/25/2015   BUN 19 05/25/2015   CO2 29 05/25/2015   TSH 1.053 05/25/2015   HGBA1C 6.1 03/09/2014   MICROALBUR 0.2 01/03/2013    Lab Results  Component Value Date   TSH 1.053 05/25/2015   Lab Results  Component Value Date   WBC 5.7 05/25/2015   HGB 13.8 05/25/2015   HCT 42.8 05/25/2015   MCV 77.5 (L) 05/25/2015   PLT 203 05/25/2015   Lab Results  Component Value Date   NA 140 05/25/2015   K 4.0 05/25/2015   CO2 29 05/25/2015   GLUCOSE 97 05/25/2015   BUN 19 05/25/2015   CREATININE 0.68 05/25/2015   BILITOT 0.4 05/25/2015   BILITOT 0.4 05/25/2015   ALKPHOS 52 05/25/2015   ALKPHOS 52 05/25/2015   AST 21 05/25/2015   AST 21 05/25/2015   ALT 21 05/25/2015   ALT 21 05/25/2015   PROT 7.8 05/25/2015   PROT 7.8 05/25/2015   ALBUMIN 4.3 05/25/2015   ALBUMIN 4.3 05/25/2015   CALCIUM 9.9 05/25/2015   GFR 98.73 03/09/2014   Lab Results  Component Value Date   CHOL 257 (H) 05/25/2015   Lab Results  Component Value Date   HDL 71 05/25/2015   Lab Results  Component Value Date   LDLCALC 173 (H) 05/25/2015   Lab Results  Component Value Date   TRIG 66 05/25/2015   Lab  Results  Component Value Date   CHOLHDL 3.6 05/25/2015   Lab Results  Component Value Date   HGBA1C 6.1 03/09/2014       Assessment & Plan:   Problem List Items Addressed This Visit    None    Visit Diagnoses    Fever, unspecified fever cause    -  Primary   Relevant Medications   cefTRIAXone (ROCEPHIN) injection 1 g (Completed)   Other Relevant Orders   POC Influenza A&B (Binax test) (Completed)   Cough       Relevant Medications   cefTRIAXone (ROCEPHIN) injection 1 g (Completed)   Other Relevant Orders   DG Chest 2 View (Completed)   Pneumonia due to infectious organism, unspecified laterality, unspecified part of lung       Relevant Medications   amoxicillin-clavulanate (AUGMENTIN) 875-125 MG tablet   levocetirizine (XYZAL) 5 MG tablet   levofloxacin (LEVAQUIN) 500 MG tablet   cefTRIAXone (ROCEPHIN) injection 1 g (Completed)      I have discontinued Ms. Gaal's brimonidine-timolol and cefdinir. I am also having her start on levofloxacin. Additionally, I am having her maintain her multivitamin, fluticasone, Ferrous Sulfate Dried, Lactobacillus (PROBIOTIC ACIDOPHILUS PO), lisinopril, pravastatin, pravastatin, furosemide, amoxicillin-clavulanate, latanoprost, and levocetirizine. We administered cefTRIAXone.  Meds ordered this encounter  Medications  . amoxicillin-clavulanate (AUGMENTIN) 875-125 MG tablet  . latanoprost (XALATAN) 0.005 % ophthalmic solution  . levocetirizine (XYZAL) 5 MG tablet    Sig: Take 1 tablet (5 mg total) by mouth every evening.    Dispense:  30 tablet    Refill:  3  . levofloxacin (LEVAQUIN) 500 MG tablet    Sig: Take 1 tablet (500 mg total) by mouth  daily.    Dispense:  10 tablet    Refill:  0  . cefTRIAXone (ROCEPHIN) injection 1 g    CMA served as scribe during this visit. History, Physical and Plan performed by medical provider. Documentation and orders reviewed and attested to.  Ann Held, DO

## 2016-05-30 NOTE — Patient Instructions (Addendum)
Can take Mucinex blue and white box or Mucinex DM green and white box.  Keep using Flonase and get Xyzal.  Get REST and lots and lots of water!    Community-Acquired Pneumonia, Adult Pneumonia is an infection of the lungs. There are different types of pneumonia. One type can develop while a person is in a hospital. A different type, called community-acquired pneumonia, develops in people who are not, or have not recently been, in the hospital or other health care facility. What are the causes? Pneumonia may be caused by bacteria, viruses, or funguses. Community-acquired pneumonia is often caused by Streptococcus pneumonia bacteria. These bacteria are often passed from one person to another by breathing in droplets from the cough or sneeze of an infected person. What increases the risk? The condition is more likely to develop in:  People who havechronic diseases, such as chronic obstructive pulmonary disease (COPD), asthma, congestive heart failure, cystic fibrosis, diabetes, or kidney disease.  People who haveearly-stage or late-stage HIV.  People who havesickle cell disease.  People who havehad their spleen removed (splenectomy).  People who havepoor Human resources officer.  People who havemedical conditions that increase the risk of breathing in (aspirating) secretions their own mouth and nose.  People who havea weakened immune system (immunocompromised).  People who smoke.  People whotravel to areas where pneumonia-causing germs commonly exist.  People whoare around animal habitats or animals that have pneumonia-causing germs, including birds, bats, rabbits, cats, and farm animals. What are the signs or symptoms? Symptoms of this condition include:  Adry cough.  A wet (productive) cough.  Fever.  Sweating.  Chest pain, especially when breathing deeply or coughing.  Rapid breathing or difficulty breathing.  Shortness of breath.  Shaking chills.  Fatigue.  Muscle  aches. How is this diagnosed? Your health care provider will take a medical history and perform a physical exam. You may also have other tests, including:  Imaging studies of your chest, including X-rays.  Tests to check your blood oxygen level and other blood gases.  Other tests on blood, mucus (sputum), fluid around your lungs (pleural fluid), and urine. If your pneumonia is severe, other tests may be done to identify the specific cause of your illness. How is this treated? The type of treatment that you receive depends on many factors, such as the cause of your pneumonia, the medicines you take, and other medical conditions that you have. For most adults, treatment and recovery from pneumonia may occur at home. In some cases, treatment must happen in a hospital. Treatment may include:  Antibiotic medicines, if the pneumonia was caused by bacteria.  Antiviral medicines, if the pneumonia was caused by a virus.  Medicines that are given by mouth or through an IV tube.  Oxygen.  Respiratory therapy. Although rare, treating severe pneumonia may include:  Mechanical ventilation. This is done if you are not breathing well on your own and you cannot maintain a safe blood oxygen level.  Thoracentesis. This procedureremoves fluid around one lung or both lungs to help you breathe better. Follow these instructions at home:  Take over-the-counter and prescription medicines only as told by your health care provider.  Only takecough medicine if you are losing sleep. Understand that cough medicine can prevent your body's natural ability to remove mucus from your lungs.  If you were prescribed an antibiotic medicine, take it as told by your health care provider. Do not stop taking the antibiotic even if you start to feel better.  Sleep in  a semi-upright position at night. Try sleeping in a reclining chair, or place a few pillows under your head.  Do not use tobacco products, including  cigarettes, chewing tobacco, and e-cigarettes. If you need help quitting, ask your health care provider.  Drink enough water to keep your urine clear or pale yellow. This will help to thin out mucus secretions in your lungs. How is this prevented? There are ways that you can decrease your risk of developing community-acquired pneumonia. Consider getting a pneumococcal vaccine if:  You are older than 57 years of age.  You are older than 57 years of age and are undergoing cancer treatment, have chronic lung disease, or have other medical conditions that affect your immune system. Ask your health care provider if this applies to you. There are different types and schedules of pneumococcal vaccines. Ask your health care provider which vaccination option is best for you. You may also prevent community-acquired pneumonia if you take these actions:  Get an influenza vaccine every year. Ask your health care provider which type of influenza vaccine is best for you.  Go to the dentist on a regular basis.  Wash your hands often. Use hand sanitizer if soap and water are not available. Contact a health care provider if:  You have a fever.  You are losing sleep because you cannot control your cough with cough medicine. Get help right away if:  You have worsening shortness of breath.  You have increased chest pain.  Your sickness becomes worse, especially if you are an older adult or have a weakened immune system.  You cough up blood. This information is not intended to replace advice given to you by your health care provider. Make sure you discuss any questions you have with your health care provider. Document Released: 04/07/2005 Document Revised: 08/16/2015 Document Reviewed: 08/02/2014 Elsevier Interactive Patient Education  2017 [employer].

## 2016-06-13 ENCOUNTER — Other Ambulatory Visit: Payer: Self-pay | Admitting: Family Medicine

## 2016-06-13 DIAGNOSIS — I10 Essential (primary) hypertension: Secondary | ICD-10-CM

## 2016-06-26 ENCOUNTER — Encounter: Payer: Self-pay | Admitting: Family Medicine

## 2016-06-26 ENCOUNTER — Ambulatory Visit (INDEPENDENT_AMBULATORY_CARE_PROVIDER_SITE_OTHER): Payer: 59 | Admitting: Family Medicine

## 2016-06-26 VITALS — BP 118/70 | HR 75 | Temp 98.1°F | Resp 16 | Ht 66.0 in | Wt 260.6 lb

## 2016-06-26 DIAGNOSIS — I10 Essential (primary) hypertension: Secondary | ICD-10-CM | POA: Diagnosis not present

## 2016-06-26 DIAGNOSIS — Z0000 Encounter for general adult medical examination without abnormal findings: Secondary | ICD-10-CM | POA: Diagnosis not present

## 2016-06-26 DIAGNOSIS — E785 Hyperlipidemia, unspecified: Secondary | ICD-10-CM

## 2016-06-26 DIAGNOSIS — R7309 Other abnormal glucose: Secondary | ICD-10-CM | POA: Diagnosis not present

## 2016-06-26 NOTE — Progress Notes (Signed)
Pre visit review using our clinic review tool, if applicable. No additional management support is needed unless otherwise documented below in the visit note. 

## 2016-06-26 NOTE — Progress Notes (Signed)
Subjective:  I acted as a Education administrator for Dr. Royden Purl, LPN    Patient ID: Heather [name], female    DOB: [birth year], 56 y.o.   MRN: [id number]  Chief Complaint  Patient presents with  . Annual Exam  . Hyperlipidemia  . Hypertension  . Hyperglycemia    Hyperlipidemia  This is a chronic problem. The current episode started more than 1 year ago. The problem is controlled. Pertinent negatives include no chest pain.  Hypertension  This is a chronic problem. The current episode started more than 1 year ago. The problem is controlled. Pertinent negatives include no blurred vision, chest pain, headaches or palpitations.  Hyperglycemia  This is a chronic problem. The current episode started more than 1 year ago. Pertinent negatives include no chest pain, congestion, coughing, fever, headaches, rash or vomiting.    Patient is in today for Annual Physical. Patient state she has no concerns at this time.  Patient Care Team: Ann Held, DO as PCP - General   Past Medical History:  Diagnosis Date  . Acute bronchitis   . Allergic rhinitis   . Allergy   . Anemia   . Arthritis    knees  . Glaucoma   . Hyperlipidemia   . Hypertension     Past Surgical History:  Procedure Laterality Date  . ABDOMINAL HYSTERECTOMY  1998  . BREAST LUMPECTOMY    . CESAREAN SECTION      Family History  Problem Relation Age of Onset  . Endometrial cancer    . Atrial fibrillation    . Dementia Father   . Stroke Father   . Cancer Father 63    prostate  . Atrial fibrillation Father   . Alzheimer's disease Father   . Hyperlipidemia Brother   . Hypertension Brother   . Diabetes Maternal Aunt   . Diabetes Maternal Uncle   . Kidney disease Maternal Uncle     dialysis  . Cancer Maternal Grandmother     uterine  . Cancer Maternal Grandfather   . Cancer Paternal Grandfather     pancreatic  . Hyperlipidemia Brother   . Hypertension Brother   . Hypertension Brother   . Pulmonary  embolism Brother   . Cancer - Colon Brother 60    colon CA--stage 4  . Colon cancer Brother   . Colon cancer Cousin 28    Social History   Social History  . Marital status: Single    Spouse name: N/A  . Number of children: N/A  . Years of education: 66   Occupational History  . [employer]manage lab    Social History Main Topics  . Smoking status: Never Smoker  . Smokeless tobacco: Never Used  . Alcohol use 0.0 oz/week     Comment: occ  . Drug use: No  . Sexual activity: Yes   Other Topics Concern  . Not on file   Social History Narrative   Exercise-- no    Outpatient Medications Prior to Visit  Medication Sig Dispense Refill  . amoxicillin-clavulanate (AUGMENTIN) 875-125 MG tablet     . Ferrous Sulfate Dried (GNP IRON) 200 (65 FE) MG TABS Take 65 mg by mouth every other day.    . fluticasone (FLONASE) 50 MCG/ACT nasal spray Place 2 sprays into both nostrils daily. 16 g 1  . Lactobacillus (PROBIOTIC ACIDOPHILUS PO) Take by mouth.    . latanoprost (XALATAN) 0.005 % ophthalmic solution     . levocetirizine (XYZAL)  5 MG tablet Take 1 tablet (5 mg total) by mouth every evening. 30 tablet 3  . levofloxacin (LEVAQUIN) 500 MG tablet Take 1 tablet (500 mg total) by mouth daily. 10 tablet 0  . Multiple Vitamin (MULTIVITAMIN) tablet Take 1 tablet by mouth daily.      . pravastatin (PRAVACHOL) 20 MG tablet Take 1 tablet (20 mg total) by mouth daily. Repeat labs are due now 30 tablet 0  . furosemide (LASIX) 20 MG tablet TAKE ONE TABLET BY MOUTH ONCE DAILY 90 tablet 0  . lisinopril (PRINIVIL,ZESTRIL) 10 MG tablet TAKE ONE TABLET BY MOUTH ONCE DAILY 90 tablet 3  . pravastatin (PRAVACHOL) 20 MG tablet TAKE ONE TABLET BY MOUTH ONCE DAILY 90 tablet 0   No facility-administered medications prior to visit.     Allergies  Allergen Reactions  . Tamiflu     Review of Systems  Constitutional: Negative for fever.  HENT: Negative for congestion.   Eyes: Negative for  blurred vision.  Respiratory: Negative for cough.   Cardiovascular: Negative for chest pain and palpitations.  Gastrointestinal: Negative for vomiting.  Musculoskeletal: Negative for back pain.  Skin: Negative for rash.  Neurological: Negative for loss of consciousness and headaches.       Objective:    Physical Exam  Constitutional: She is oriented to person, place, and time. She appears well-developed and well-nourished. No distress.  HENT:  Head: Normocephalic and atraumatic.  Eyes: Conjunctivae are normal. Pupils are equal, round, and reactive to light.  Neck: Normal range of motion. No thyromegaly present.  Cardiovascular: Normal rate and regular rhythm.   Pulmonary/Chest: Effort normal and breath sounds normal. She has no wheezes.  Abdominal: Soft. Bowel sounds are normal. There is no tenderness.  Musculoskeletal: Normal range of motion. She exhibits no edema or deformity.  Neurological: She is alert and oriented to person, place, and time.  Skin: Skin is warm and dry. She is not diaphoretic.  Psychiatric: She has a normal mood and affect.    BP 118/70 (BP Location: Left Arm, Patient Position: Sitting, Cuff Size: Large)   Pulse 75   Temp 98.1 F (36.7 C) (Oral)   Resp 16   Ht 5\' 6"  (1.676 m)   Wt 260 lb 9.6 oz (118.2 kg)   SpO2 97%   BMI 42.06 kg/m  Wt Readings from Last 3 Encounters:  06/26/16 260 lb 9.6 oz (118.2 kg)  05/30/16 268 lb 3.2 oz (121.7 kg)  06/14/15 253 lb 6.4 oz (114.9 kg)     Lab Results  Component Value Date   WBC 5.7 05/25/2015   HGB 13.8 05/25/2015   HCT 42.8 05/25/2015   PLT 203 05/25/2015   GLUCOSE 97 05/25/2015   CHOL 257 (H) 05/25/2015   TRIG 66 05/25/2015   HDL 71 05/25/2015   LDLDIRECT 128.0 01/03/2013   LDLCALC 173 (H) 05/25/2015   ALT 21 05/25/2015   ALT 21 05/25/2015   AST 21 05/25/2015   AST 21 05/25/2015   NA 140 05/25/2015   K 4.0 05/25/2015   CL 99 05/25/2015   CREATININE 0.68 05/25/2015   BUN 19 05/25/2015   CO2  29 05/25/2015   TSH 1.053 05/25/2015   HGBA1C 6.1 03/09/2014   MICROALBUR 0.2 01/03/2013    Lab Results  Component Value Date   TSH 1.053 05/25/2015   Lab Results  Component Value Date   WBC 5.7 05/25/2015   HGB 13.8 05/25/2015   HCT 42.8 05/25/2015   MCV 77.5 (L) 05/25/2015  PLT 203 05/25/2015   Lab Results  Component Value Date   NA 140 05/25/2015   K 4.0 05/25/2015   CO2 29 05/25/2015   GLUCOSE 97 05/25/2015   BUN 19 05/25/2015   CREATININE 0.68 05/25/2015   BILITOT 0.4 05/25/2015   BILITOT 0.4 05/25/2015   ALKPHOS 52 05/25/2015   ALKPHOS 52 05/25/2015   AST 21 05/25/2015   AST 21 05/25/2015   ALT 21 05/25/2015   ALT 21 05/25/2015   PROT 7.8 05/25/2015   PROT 7.8 05/25/2015   ALBUMIN 4.3 05/25/2015   ALBUMIN 4.3 05/25/2015   CALCIUM 9.9 05/25/2015   GFR 98.73 03/09/2014   Lab Results  Component Value Date   CHOL 257 (H) 05/25/2015   Lab Results  Component Value Date   HDL 71 05/25/2015   Lab Results  Component Value Date   LDLCALC 173 (H) 05/25/2015   Lab Results  Component Value Date   TRIG 66 05/25/2015   Lab Results  Component Value Date   CHOLHDL 3.6 05/25/2015   Lab Results  Component Value Date   HGBA1C 6.1 03/09/2014       Assessment & Plan:   Problem List Items Addressed This Visit      Unprioritized   HYPERGLYCEMIA, FASTING   Relevant Orders   Hemoglobin A1c   Hyperlipidemia   Relevant Medications   furosemide (LASIX) 20 MG tablet   lisinopril (PRINIVIL,ZESTRIL) 10 MG tablet   pravastatin (PRAVACHOL) 20 MG tablet   Other Relevant Orders   Lipid panel   Essential hypertension - Primary    Stable con't lisinopril       Relevant Medications   furosemide (LASIX) 20 MG tablet   lisinopril (PRINIVIL,ZESTRIL) 10 MG tablet   pravastatin (PRAVACHOL) 20 MG tablet   Other Relevant Orders   CBC   Comprehensive metabolic panel   Preventative health care    See avs ghm utd Check labs         I have changed Ms.  Mira's furosemide, lisinopril, and pravastatin. I am also having her maintain her multivitamin, fluticasone, Ferrous Sulfate Dried, Lactobacillus (PROBIOTIC ACIDOPHILUS PO), pravastatin, amoxicillin-clavulanate, latanoprost, levocetirizine, and levofloxacin.  Meds ordered this encounter  Medications  . furosemide (LASIX) 20 MG tablet    Sig: Take 1 tablet (20 mg total) by mouth daily.    Dispense:  90 tablet    Refill:  0  . lisinopril (PRINIVIL,ZESTRIL) 10 MG tablet    Sig: Take 1 tablet (10 mg total) by mouth daily.    Dispense:  90 tablet    Refill:  3  . pravastatin (PRAVACHOL) 20 MG tablet    Sig: Take 1 tablet (20 mg total) by mouth daily.    Dispense:  90 tablet    Refill:  0    CMA served as scribe during this visit. History, Physical and Plan performed by medical provider. Documentation and orders reviewed and attested to.  Ann Held, DO  Patient ID: Heather [name], female   DOB: 21-Nov-1960, 56 y.o.   MRN: [id number]

## 2016-06-26 NOTE — Patient Instructions (Signed)

## 2016-06-27 DIAGNOSIS — H401131 Primary open-angle glaucoma, bilateral, mild stage: Secondary | ICD-10-CM | POA: Diagnosis not present

## 2016-06-29 DIAGNOSIS — Z0000 Encounter for general adult medical examination without abnormal findings: Secondary | ICD-10-CM | POA: Insufficient documentation

## 2016-06-29 MED ORDER — LISINOPRIL 10 MG PO TABS: 10.0000 mg | ORAL_TABLET | Freq: Every day | ORAL | 3 refills | Status: DC

## 2016-06-29 MED ORDER — PRAVASTATIN SODIUM 20 MG PO TABS: 20.0000 mg | ORAL_TABLET | Freq: Every day | ORAL | 0 refills | Status: DC

## 2016-06-29 MED ORDER — FUROSEMIDE 20 MG PO TABS: 20.0000 mg | ORAL_TABLET | Freq: Every day | ORAL | 0 refills | Status: DC

## 2016-06-29 NOTE — Assessment & Plan Note (Signed)
See avs ghm utd  Check labs 

## 2016-06-29 NOTE — Assessment & Plan Note (Signed)
Stable con't lisinopril

## 2016-06-30 ENCOUNTER — Other Ambulatory Visit (INDEPENDENT_AMBULATORY_CARE_PROVIDER_SITE_OTHER): Payer: 59

## 2016-06-30 DIAGNOSIS — I10 Essential (primary) hypertension: Secondary | ICD-10-CM

## 2016-06-30 DIAGNOSIS — E785 Hyperlipidemia, unspecified: Secondary | ICD-10-CM | POA: Diagnosis not present

## 2016-06-30 DIAGNOSIS — R7309 Other abnormal glucose: Secondary | ICD-10-CM

## 2016-06-30 LAB — CBC
HCT: 40.7 % (ref 36.0–46.0)
Hemoglobin: 13.3 g/dL (ref 12.0–15.0)
MCHC: 32.7 g/dL (ref 30.0–36.0)
MCV: 76.1 fl — ABNORMAL LOW (ref 78.0–100.0)
Platelets: 166 10*3/uL (ref 150.0–400.0)
RBC: 5.35 Mil/uL — ABNORMAL HIGH (ref 3.87–5.11)
RDW: 16.7 % — AB (ref 11.5–15.5)
WBC: 5.3 10*3/uL (ref 4.0–10.5)

## 2016-06-30 LAB — COMPREHENSIVE METABOLIC PANEL
ALK PHOS: 45 U/L (ref 39–117)
ALT: 26 U/L (ref 0–35)
AST: 29 U/L (ref 0–37)
Albumin: 4.1 g/dL (ref 3.5–5.2)
BILIRUBIN TOTAL: 0.4 mg/dL (ref 0.2–1.2)
BUN: 15 mg/dL (ref 6–23)
CO2: 29 mEq/L (ref 19–32)
CREATININE: 0.78 mg/dL (ref 0.40–1.20)
Calcium: 9.7 mg/dL (ref 8.4–10.5)
Chloride: 106 mEq/L (ref 96–112)
GFR: 97.91 mL/min (ref >60.00)
GLUCOSE: 94 mg/dL (ref 70–99)
Potassium: 3.7 mEq/L (ref 3.5–5.1)
SODIUM: 141 meq/L (ref 135–145)
TOTAL PROTEIN: 7.6 g/dL (ref 6.0–8.3)

## 2016-06-30 LAB — LIPID PANEL
Cholesterol: 203 mg/dL — ABNORMAL HIGH (ref 0–200)
HDL: 54.8 mg/dL (ref >39.00)
LDL Cholesterol: 128 mg/dL — ABNORMAL HIGH (ref 0–99)
NONHDL: 148.39
Total CHOL/HDL Ratio: 4
Triglycerides: 103 mg/dL (ref 0.0–149.0)
VLDL: 20.6 mg/dL (ref 0.0–40.0)

## 2016-06-30 LAB — HEMOGLOBIN A1C: HEMOGLOBIN A1C: 6.1 % (ref 4.6–6.5)

## 2016-07-04 ENCOUNTER — Other Ambulatory Visit: Payer: Self-pay | Admitting: Family Medicine

## 2016-07-04 DIAGNOSIS — E785 Hyperlipidemia, unspecified: Secondary | ICD-10-CM

## 2016-07-04 MED ORDER — PRAVASTATIN SODIUM 40 MG PO TABS: 40.0000 mg | ORAL_TABLET | Freq: Every day | ORAL | 1 refills | Status: DC

## 2016-09-02 ENCOUNTER — Other Ambulatory Visit: Payer: Self-pay | Admitting: Family Medicine

## 2016-09-08 DIAGNOSIS — Z1231 Encounter for screening mammogram for malignant neoplasm of breast: Secondary | ICD-10-CM | POA: Diagnosis not present

## 2016-09-08 LAB — HM MAMMOGRAPHY

## 2016-09-17 ENCOUNTER — Encounter: Payer: Self-pay | Admitting: *Deleted

## 2016-09-24 DIAGNOSIS — H401131 Primary open-angle glaucoma, bilateral, mild stage: Secondary | ICD-10-CM | POA: Diagnosis not present

## 2016-09-24 DIAGNOSIS — H25813 Combined forms of age-related cataract, bilateral: Secondary | ICD-10-CM | POA: Diagnosis not present

## 2016-10-06 ENCOUNTER — Other Ambulatory Visit (INDEPENDENT_AMBULATORY_CARE_PROVIDER_SITE_OTHER): Payer: 59

## 2016-10-06 DIAGNOSIS — E785 Hyperlipidemia, unspecified: Secondary | ICD-10-CM

## 2016-10-06 LAB — LIPID PANEL
CHOLESTEROL: 182 mg/dL (ref 0–200)
HDL: 55.4 mg/dL (ref >39.00)
LDL Cholesterol: 111 mg/dL — ABNORMAL HIGH (ref 0–99)
NONHDL: 126.22
Total CHOL/HDL Ratio: 3
Triglycerides: 76 mg/dL (ref 0.0–149.0)
VLDL: 15.2 mg/dL (ref 0.0–40.0)

## 2016-10-06 LAB — COMPREHENSIVE METABOLIC PANEL
ALBUMIN: 4 g/dL (ref 3.5–5.2)
ALK PHOS: 45 U/L (ref 39–117)
ALT: 16 U/L (ref 0–35)
AST: 22 U/L (ref 0–37)
BILIRUBIN TOTAL: 0.4 mg/dL (ref 0.2–1.2)
BUN: 19 mg/dL (ref 6–23)
CO2: 30 mEq/L (ref 19–32)
CREATININE: 0.8 mg/dL (ref 0.40–1.20)
Calcium: 9.9 mg/dL (ref 8.4–10.5)
Chloride: 101 mEq/L (ref 96–112)
GFR: 95 mL/min (ref >60.00)
GLUCOSE: 94 mg/dL (ref 70–99)
POTASSIUM: 4.2 meq/L (ref 3.5–5.1)
SODIUM: 135 meq/L (ref 135–145)
TOTAL PROTEIN: 7.2 g/dL (ref 6.0–8.3)

## 2016-12-16 ENCOUNTER — Ambulatory Visit (INDEPENDENT_AMBULATORY_CARE_PROVIDER_SITE_OTHER): Payer: 59 | Admitting: Family Medicine

## 2016-12-16 ENCOUNTER — Encounter: Payer: Self-pay | Admitting: Family Medicine

## 2016-12-16 VITALS — BP 132/68 | HR 93 | Temp 98.3°F | Resp 14 | Ht 66.0 in | Wt 245.0 lb

## 2016-12-16 DIAGNOSIS — M542 Cervicalgia: Secondary | ICD-10-CM

## 2016-12-16 DIAGNOSIS — I517 Cardiomegaly: Secondary | ICD-10-CM

## 2016-12-16 NOTE — Assessment & Plan Note (Addendum)
+   muscle spasms---  Improved Tylenol prn Warn compresses rto prn

## 2016-12-16 NOTE — Progress Notes (Signed)
Patient ID: Heather [name], female    DOB: [birth year]  Age: 56 y.o. MRN: [id number]    Subjective:  Subjective  HPI Heather [name] presents for f/u from neck pain last Thursday--- sharp pain from base of neck to base of the scalp on the left side-- which con't all day.  When she went home she took 2 aspirin and slept.  She was afraid it was a stroke but it was gone the next day but area was sore.   Pt sits at a computer all day.   Pt is very concerned because she has had several family members die from stroke or mi recently No cp, no sob.  Review of Systems  Constitutional: Negative for fever.  HENT: Negative for congestion.   Respiratory: Negative for shortness of breath.   Cardiovascular: Negative for chest pain, palpitations and leg swelling.  Gastrointestinal: Negative for abdominal pain, blood in stool and nausea.  Genitourinary: Negative for dysuria and frequency.  Musculoskeletal: Negative for back pain, neck pain and neck stiffness.  Skin: Negative for rash.  Allergic/Immunologic: Negative for environmental allergies.  Neurological: Negative for dizziness and headaches.  Psychiatric/Behavioral: The patient is not nervous/anxious.     History Past Medical History:  Diagnosis Date  . Acute bronchitis   . Allergic rhinitis   . Allergy   . Anemia   . Arthritis    knees  . Glaucoma   . Hyperlipidemia   . Hypertension     She has a past surgical history that includes Abdominal hysterectomy (1998); Breast lumpectomy; and Cesarean section.   Her family history includes Alzheimer's disease in her father; Atrial fibrillation in her father and unknown relative; Cancer in her maternal grandfather, maternal grandmother, and paternal grandfather; Cancer (age of onset: 45) in her father; Cancer - Colon (age of onset: 5) in her brother; Colon cancer in her brother; Colon cancer (age of onset: 52) in her cousin; Dementia in her father; Diabetes in her maternal aunt and maternal  uncle; Endometrial cancer in her unknown relative; Hyperlipidemia in her brother and brother; Hypertension in her brother, brother, and brother; Kidney disease in her maternal uncle; Pulmonary embolism in her brother; Stroke in her father.She reports that she has never smoked. She has never used smokeless tobacco. She reports that she drinks alcohol. She reports that she does not use drugs.  Current Outpatient Prescriptions on File Prior to Visit  Medication Sig Dispense Refill  . Ferrous Sulfate Dried (GNP IRON) 200 (65 FE) MG TABS Take 65 mg by mouth every other day.    . fluticasone (FLONASE) 50 MCG/ACT nasal spray Place 2 sprays into both nostrils daily. 16 g 1  . furosemide (LASIX) 20 MG tablet Take 1 tablet (20 mg total) by mouth daily. 90 tablet 0  . Lactobacillus (PROBIOTIC ACIDOPHILUS PO) Take by mouth.    . latanoprost (XALATAN) 0.005 % ophthalmic solution     . levocetirizine (XYZAL) 5 MG tablet Take 1 tablet (5 mg total) by mouth every evening. 30 tablet 3  . lisinopril (PRINIVIL,ZESTRIL) 10 MG tablet Take 1 tablet (10 mg total) by mouth daily. 90 tablet 3  . Multiple Vitamin (MULTIVITAMIN) tablet Take 1 tablet by mouth daily.      . pravastatin (PRAVACHOL) 40 MG tablet Take 1 tablet (40 mg total) by mouth daily. 90 tablet 1  . amoxicillin-clavulanate (AUGMENTIN) 875-125 MG tablet     . levofloxacin (LEVAQUIN) 500 MG tablet Take 1 tablet (500 mg total) by mouth daily. (  Patient not taking: Reported on 12/16/2016) 10 tablet 0   No current facility-administered medications on file prior to visit.      Objective:  Objective  Physical Exam  Constitutional: She is oriented to person, place, and time. She appears well-developed and well-nourished.  HENT:  Head: Normocephalic and atraumatic.  Eyes: Conjunctivae and EOM are normal.  Neck: Normal range of motion. Neck supple. No JVD present. Carotid bruit is not present. No thyromegaly present.  Cardiovascular: Normal rate, regular rhythm  and normal heart sounds.   No murmur heard. Pulmonary/Chest: Effort normal and breath sounds normal. No respiratory distress. She has no wheezes. She has no rales. She exhibits no tenderness.  Musculoskeletal: She exhibits no edema.  Neurological: She is alert and oriented to person, place, and time.  Psychiatric: She has a normal mood and affect. Her behavior is normal. Judgment and thought content normal.  Nursing note and vitals reviewed.  BP 132/68 (BP Location: Left Arm, Patient Position: Sitting, Cuff Size: Small)   Pulse 93   Temp 98.3 F (36.8 C) (Oral)   Resp 14   Ht 5\' 6"  (1.676 m)   Wt 245 lb (111.1 kg)   SpO2 97%   BMI 39.54 kg/m  Wt Readings from Last 3 Encounters:  12/16/16 245 lb (111.1 kg)  06/26/16 260 lb 9.6 oz (118.2 kg)  05/30/16 268 lb 3.2 oz (121.7 kg)     Lab Results  Component Value Date   WBC 5.3 06/30/2016   HGB 13.3 06/30/2016   HCT 40.7 06/30/2016   PLT 166.0 06/30/2016   GLUCOSE 94 10/06/2016   CHOL 182 10/06/2016   TRIG 76.0 10/06/2016   HDL 55.40 10/06/2016   LDLDIRECT 128.0 01/03/2013   LDLCALC 111 (H) 10/06/2016   ALT 16 10/06/2016   AST 22 10/06/2016   NA 135 10/06/2016   K 4.2 10/06/2016   CL 101 10/06/2016   CREATININE 0.80 10/06/2016   BUN 19 10/06/2016   CO2 30 10/06/2016   TSH 1.053 05/25/2015   HGBA1C 6.1 06/30/2016   MICROALBUR 0.2 01/03/2013    Dg Chest 2 View  Result Date: 05/30/2016 CLINICAL DATA:  Cough, fever and shortness of breath EXAM: CHEST  2 VIEW COMPARISON:  Chest radiograph 06/30/2011 FINDINGS: The lungs are well inflated. Cardiomediastinal contours are normal. No pneumothorax or pleural effusion. No focal airspace consolidation or pulmonary edema. IMPRESSION: No active cardiopulmonary disease. Electronically Signed   By: Ulyses Jarred M.D.   On: 05/30/2016 15:48     Assessment & Plan:  Plan  I am having Ms. Heather [name] maintain her multivitamin, fluticasone, Ferrous Sulfate Dried, Lactobacillus (PROBIOTIC  ACIDOPHILUS PO), amoxicillin-clavulanate, latanoprost, levocetirizine, levofloxacin, furosemide, lisinopril, and pravastatin.  No orders of the defined types were placed in this encounter.   Problem List Items Addressed This Visit      Unprioritized   Neck pain on left side - Primary    + muscle spasms---  Improved Tylenol prn Warn compresses rto prn       Relevant Orders   EKG 12-Lead (Completed)    Other Visit Diagnoses    r atrial enlargement       Relevant Orders   ECHOCARDIOGRAM COMPLETE    ekg== r atrial enlargement   Follow-up: Return if symptoms worsen or fail to improve.  Ann Held, DO    Pre visit review using our clinic review tool, if applicable. No additional management support is needed unless otherwise documented below in the visit note.

## 2016-12-16 NOTE — Patient Instructions (Signed)
Cervical Sprain A cervical sprain is a stretch or tear in one or more of the tough, cord-like tissues that connect bones (ligaments) in the neck. Cervical sprains can range from mild to severe. Severe cervical sprains can cause the spinal bones (vertebrae) in the neck to be unstable. This can lead to spinal cord damage and can result in serious nervous system problems. The amount of time that it takes for a cervical sprain to get better depends on the cause and extent of the injury. Most cervical sprains heal in 4-6 weeks. What are the causes? Cervical sprains may be caused by an injury (trauma), such as from a motor vehicle accident, a fall, or sudden forward and backward whipping movement of the head and neck (whiplash injury). Mild cervical sprains may be caused by wear and tear over time, such as from poor posture, sitting in a chair that does not provide support, or looking up or down for long periods of time. What increases the risk? The following factors may make you more likely to develop this condition:  Participating in activities that have a high risk of trauma to the neck. These include contact sports, auto racing, gymnastics, and diving.  Taking risks when driving or riding in a motor vehicle, such as speeding.  Having osteoarthritis of the spine.  Having poor strength and flexibility of the neck.  A previous neck injury.  Having poor posture.  Spending a lot of time in certain positions that put stress on the neck, such as sitting at a computer for long periods of time. What are the signs or symptoms? Symptoms of this condition include:  Pain, soreness, stiffness, tenderness, swelling, or a burning sensation in the front, back, or sides of the neck.  Sudden tightening of neck muscles that you cannot control (muscle spasms).  Pain in the shoulders or upper back.  Limited ability to move the neck.  Headache.  Dizziness.  Nausea.  Vomiting.  Weakness, numbness, or  tingling in a hand or an arm. Symptoms may develop right away after injury, or they may develop over a few days. In some cases, symptoms may go away with treatment and return (recur) over time. How is this diagnosed? This condition may be diagnosed based on:  Your medical history.  Your symptoms.  Any recent injuries or known neck problems that you have, such as arthritis in the neck.  A physical exam.  Imaging tests, such as:  X-rays.  MRI.  CT scan. How is this treated? This condition is treated by resting and icing the injured area and doing physical therapy exercises. Depending on the severity of your condition, treatment may also include:  Keeping your neck in place (immobilized) for periods of time. This may be done using:  A cervical collar. This supports your chin and the back of your head.  A cervical traction device. This is a sling that holds up your head. This removes weight and pressure from your neck, and it may help to relieve pain.  Medicines that help to relieve pain and inflammation.  Medicines that help to relax your muscles (muscle relaxants).  Surgery. This is rare. Follow these instructions at home: If you have a cervical collar:   Wear it as told by your health care provider. Do not remove the collar unless instructed by your health care provider.  Ask your health care provider before you make any adjustments to your collar.  If you have long hair, keep it outside of the collar.    Ask your health care provider if you can remove the collar for cleaning and bathing. If you are allowed to remove the collar for cleaning or bathing:  Follow instructions from your health care provider about how to remove the collar safely.  Clean the collar by wiping it with mild soap and water and drying it completely.  If your collar has removable pads, remove them every 1-2 days and wash them by hand with soap and water. Let them air-dry completely before you put  them back in the collar.  Check your skin under the collar for irritation or sores. If you see any, tell your health care provider. Managing pain, stiffness, and swelling   If directed, use a cervical traction device as told by your health care provider.  If directed, apply heat to the affected area before you do your physical therapy or as often as told by your health care provider. Use the heat source that your health care provider recommends, such as a moist heat pack or a heating pad.  Place a towel between your skin and the heat source.  Leave the heat on for 20-30 minutes.  Remove the heat if your skin turns bright red. This is especially important if you are unable to feel pain, heat, or cold. You may have a greater risk of getting burned.  If directed, put ice on the affected area:  Put ice in a plastic bag.  Place a towel between your skin and the bag.  Leave the ice on for 20 minutes, 2-3 times a day. Activity   Do not drive while wearing a cervical collar. If you do not have a cervical collar, ask your health care provider if it is safe to drive while your neck heals.  Do not drive or use heavy machinery while taking prescription pain medicine or muscle relaxants, unless your health care provider approves.  Do not lift anything that is heavier than 10 lb (4.5 kg) until your health care provider tells you that it is safe.  Rest as directed by your health care provider. Avoid positions and activities that make your symptoms worse. Ask your health care provider what activities are safe for you.  If physical therapy was prescribed, do exercises as told by your health care provider or physical therapist. General instructions   Take over-the-counter and prescription medicines only as told by your health care provider.  Do not use any products that contain nicotine or tobacco, such as cigarettes and e-cigarettes. These can delay healing. If you need help quitting, ask your  health care provider.  Keep all follow-up visits as told by your health care provider or physical therapist. This is important. How is this prevented? To prevent a cervical sprain from happening again:  Use and maintain good posture. Make any needed adjustments to your workstation to help you use good posture.  Exercise regularly as directed by your health care provider or physical therapist.  Avoid risky activities that may cause a cervical sprain. Contact a health care provider if:  You have symptoms that get worse or do not get better after 2 weeks of treatment.  You have pain that gets worse or does not get better with medicine.  You develop new, unexplained symptoms.  You have sores or irritated skin on your neck from wearing your cervical collar. Get help right away if:  You have severe pain.  You develop numbness, tingling, or weakness in any part of your body.  You cannot move   a part of your body (you have paralysis).  You have neck pain along with:  Severe dizziness.  Headache. Summary  A cervical sprain is a stretch or tear in one or more of the tough, cord-like tissues that connect bones (ligaments) in the neck.  Cervical sprains may be caused by an injury (trauma), such as from a motor vehicle accident, a fall, or sudden forward and backward whipping movement of the head and neck (whiplash injury).  Symptoms may develop right away after injury, or they may develop over a few days.  This condition is treated by resting and icing the injured area and doing physical therapy exercises. This information is not intended to replace advice given to you by your health care provider. Make sure you discuss any questions you have with your health care provider. Document Released: 02/02/2007 Document Revised: 12/05/2015 Document Reviewed: 12/05/2015 Elsevier Interactive Patient Education  2017 [employer]  

## 2016-12-24 ENCOUNTER — Other Ambulatory Visit (HOSPITAL_BASED_OUTPATIENT_CLINIC_OR_DEPARTMENT_OTHER): Payer: 59

## 2016-12-31 ENCOUNTER — Ambulatory Visit (HOSPITAL_BASED_OUTPATIENT_CLINIC_OR_DEPARTMENT_OTHER): Admission: RE | Admit: 2016-12-31 | Payer: 59 | Source: Ambulatory Visit

## 2017-01-12 ENCOUNTER — Other Ambulatory Visit: Payer: Self-pay | Admitting: Family Medicine

## 2017-01-24 ENCOUNTER — Other Ambulatory Visit: Payer: Self-pay | Admitting: Family Medicine

## 2017-02-01 DIAGNOSIS — Z23 Encounter for immunization: Secondary | ICD-10-CM | POA: Diagnosis not present

## 2017-04-10 DIAGNOSIS — H401131 Primary open-angle glaucoma, bilateral, mild stage: Secondary | ICD-10-CM | POA: Diagnosis not present

## 2017-04-22 ENCOUNTER — Other Ambulatory Visit: Payer: Self-pay | Admitting: Family Medicine

## 2017-05-08 ENCOUNTER — Other Ambulatory Visit: Payer: Self-pay | Admitting: Family Medicine

## 2017-06-07 ENCOUNTER — Other Ambulatory Visit: Payer: Self-pay | Admitting: Family Medicine

## 2017-06-07 DIAGNOSIS — I10 Essential (primary) hypertension: Secondary | ICD-10-CM

## 2017-06-18 ENCOUNTER — Telehealth: Payer: Self-pay | Admitting: *Deleted

## 2017-06-18 DIAGNOSIS — I10 Essential (primary) hypertension: Secondary | ICD-10-CM

## 2017-06-18 DIAGNOSIS — Z0000 Encounter for general adult medical examination without abnormal findings: Secondary | ICD-10-CM

## 2017-06-18 DIAGNOSIS — E119 Type 2 diabetes mellitus without complications: Secondary | ICD-10-CM

## 2017-06-18 DIAGNOSIS — E785 Hyperlipidemia, unspecified: Secondary | ICD-10-CM

## 2017-06-18 NOTE — Telephone Encounter (Signed)
Copied from [place]. Topic: Appointment Scheduling - Scheduling Inquiry for Clinic >> Jun 18, 2017  2:00 PM Bea Graff, NT wrote: Reason for CRM: Pt is scheduled for her physical on 08/24/17 and would like come in before her appt to have her labs done. Will need orders in chart. Please call to schedule with pt.

## 2017-06-19 NOTE — Telephone Encounter (Signed)
Left message on machine that labs were ordered and she can call and make her lab appt before her appt

## 2017-07-11 ENCOUNTER — Other Ambulatory Visit: Payer: Self-pay | Admitting: Family Medicine

## 2017-08-14 ENCOUNTER — Other Ambulatory Visit (INDEPENDENT_AMBULATORY_CARE_PROVIDER_SITE_OTHER): Payer: 59

## 2017-08-14 ENCOUNTER — Ambulatory Visit: Payer: Self-pay

## 2017-08-14 DIAGNOSIS — E119 Type 2 diabetes mellitus without complications: Secondary | ICD-10-CM

## 2017-08-14 DIAGNOSIS — E785 Hyperlipidemia, unspecified: Secondary | ICD-10-CM | POA: Diagnosis not present

## 2017-08-14 DIAGNOSIS — I10 Essential (primary) hypertension: Secondary | ICD-10-CM

## 2017-08-14 LAB — LIPID PANEL
CHOL/HDL RATIO: 3
CHOLESTEROL: 196 mg/dL (ref 0–200)
HDL: 66 mg/dL (ref >39.00)
LDL CALC: 117 mg/dL — AB (ref 0–99)
NonHDL: 130.18
TRIGLYCERIDES: 66 mg/dL (ref 0.0–149.0)
VLDL: 13.2 mg/dL (ref 0.0–40.0)

## 2017-08-14 LAB — COMPREHENSIVE METABOLIC PANEL
ALT: 12 U/L (ref 0–35)
AST: 15 U/L (ref 0–37)
Albumin: 3.9 g/dL (ref 3.5–5.2)
Alkaline Phosphatase: 45 U/L (ref 39–117)
BUN: 20 mg/dL (ref 6–23)
CHLORIDE: 104 meq/L (ref 96–112)
CO2: 31 mEq/L (ref 19–32)
Calcium: 9.3 mg/dL (ref 8.4–10.5)
Creatinine, Ser: 0.77 mg/dL (ref 0.40–1.20)
GFR: 98.98 mL/min (ref >60.00)
GLUCOSE: 89 mg/dL (ref 70–99)
POTASSIUM: 4.9 meq/L (ref 3.5–5.1)
SODIUM: 140 meq/L (ref 135–145)
Total Bilirubin: 0.4 mg/dL (ref 0.2–1.2)
Total Protein: 7.2 g/dL (ref 6.0–8.3)

## 2017-08-14 LAB — CBC WITH DIFFERENTIAL/PLATELET
BASOS PCT: 1.4 % (ref 0.0–3.0)
Basophils Absolute: 0.1 10*3/uL (ref 0.0–0.1)
EOS PCT: 1.7 % (ref 0.0–5.0)
Eosinophils Absolute: 0.1 10*3/uL (ref 0.0–0.7)
HCT: 39.4 % (ref 36.0–46.0)
HEMOGLOBIN: 13 g/dL (ref 12.0–15.0)
LYMPHS ABS: 2.9 10*3/uL (ref 0.7–4.0)
Lymphocytes Relative: 54.7 % — ABNORMAL HIGH (ref 12.0–46.0)
MCHC: 32.9 g/dL (ref 30.0–36.0)
MCV: 77.2 fl — ABNORMAL LOW (ref 78.0–100.0)
MONO ABS: 0.4 10*3/uL (ref 0.1–1.0)
Monocytes Relative: 6.9 % (ref 3.0–12.0)
NEUTROS ABS: 1.9 10*3/uL (ref 1.4–7.7)
Neutrophils Relative %: 35.3 % — ABNORMAL LOW (ref 43.0–77.0)
PLATELETS: 173 10*3/uL (ref 150.0–400.0)
RBC: 5.1 Mil/uL (ref 3.87–5.11)
RDW: 15.6 % — ABNORMAL HIGH (ref 11.5–15.5)
WBC: 5.4 10*3/uL (ref 4.0–10.5)

## 2017-08-14 LAB — TSH: TSH: 1.59 u[IU]/mL (ref 0.35–4.50)

## 2017-08-14 LAB — HEMOGLOBIN A1C: HEMOGLOBIN A1C: 6.1 % (ref 4.6–6.5)

## 2017-08-14 NOTE — Telephone Encounter (Signed)
Pt calling with left arm/ hand weakness for the past 2-3 weeks maybe longer. Pt stated she wasn't sure Pt is also having pain to arm. She thought that she might be sleeping on it.  Pt states is it present now and is constant. Pt denies any numbness or weakness on one side of the face, body leg. States smile is even and no facial drooping. C/o slight headache. Has made appt and refusing [facility]. Pt ahs appt on Monday.   Reason for Disposition . [1] Weakness of arm / hand, or leg / foot AND [2] is a chronic symptom (recurrent or ongoing AND present > 4 weeks)    Pt is not sure when weakness and pain started  Answer Assessment - Initial Assessment Questions 1. SYMPTOM: "What is the main symptom you are concerned about?" (e.g., weakness, numbness)     Left arm and hand pain and weakness 2. ONSET: "When did this start?" (minutes, hours, days; while sleeping)   Pt is not sure when it started - she dismissed for a while- pt thought it was due to sleeping on it- 3. LAST NORMAL: "When was the last time you were normal (no symptoms)?" Not sure how long its been 4. PATTERN "Does this come and go, or has it been constant since it started?"  "Is it present now?"     Constant- present now 5. CARDIAC SYMPTOMS: "Have you had any of the following symptoms: chest pain, difficulty breathing, palpitations?"     no 6. NEUROLOGIC SYMPTOMS: "Have you had any of the following symptoms: headache, dizziness, vision loss, double vision, changes in speech, unsteady on your feet?"     Slight headache, no dizziness, no vision changes, unsteady on feet 7. OTHER SYMPTOMS: "Do you have any other symptoms?"     no 8. PREGNANCY: "Is there any chance you are pregnant?" "When was your last menstrual period?"     N/a n/a  Protocols used: NEUROLOGIC DEFICIT-A-AH

## 2017-08-17 ENCOUNTER — Ambulatory Visit: Payer: 59 | Admitting: Family Medicine

## 2017-08-17 ENCOUNTER — Encounter: Payer: Self-pay | Admitting: Family Medicine

## 2017-08-17 VITALS — BP 120/68 | HR 71 | Temp 98.0°F | Resp 16 | Ht 66.0 in | Wt 249.4 lb

## 2017-08-17 DIAGNOSIS — R0683 Snoring: Secondary | ICD-10-CM | POA: Diagnosis not present

## 2017-08-17 DIAGNOSIS — M79602 Pain in left arm: Secondary | ICD-10-CM

## 2017-08-17 MED ORDER — MELOXICAM 15 MG PO TABS: ORAL_TABLET | ORAL | 0 refills | Status: DC

## 2017-08-17 MED FILL — MELOXICAM 15 MG TABLET: 15 | 30 days supply | Qty: 30 | Fill #0

## 2017-08-17 NOTE — Progress Notes (Signed)
Patient ID: Heather [name], female   DOB: [birth year], 57 y.o.   MRN: [id number]     Subjective:  I acted as a Education administrator for Dr. Carollee Herter.  Guerry Bruin, [place]   Patient ID: Heather [name], female    DOB: 08-05-1960, 57 y.o.   MRN: [id number]  Chief Complaint  Patient presents with  . Arm Pain    left side    HPI  Patient is in today for left arm pain x 1 month.  Pt has tried aspirin with no relief no known injury.  No chest pain or sob.  Pt sleeps on L side so she wakes up with arm hurting and it may take all day for it to feel a little better but never goes away. Pt also c/o snoring if she sleeps on her back so she has to sleep on her side.  No other complaints.   Patient Care Team: Carollee Herter, Alferd Apa, DO as PCP - General   Past Medical History:  Diagnosis Date  . Acute bronchitis   . Allergic rhinitis   . Allergy   . Anemia   . Arthritis    knees  . Glaucoma   . Hyperlipidemia   . Hypertension     Past Surgical History:  Procedure Laterality Date  . ABDOMINAL HYSTERECTOMY  1998  . BREAST LUMPECTOMY    . CESAREAN SECTION      Family History  Problem Relation Age of Onset  . Dementia Father   . Stroke Father   . Cancer Father 62       prostate  . Atrial fibrillation Father   . Alzheimer's disease Father   . Hyperlipidemia Brother   . Hypertension Brother   . Hyperlipidemia Brother   . Hypertension Brother   . Hypertension Brother   . Pulmonary embolism Brother   . Cancer - Colon Brother 60       colon CA--stage 4  . Colon cancer Brother   . Colon cancer Cousin 19  . Endometrial cancer Unknown   . Atrial fibrillation Unknown   . Diabetes Maternal Aunt   . Diabetes Maternal Uncle   . Kidney disease Maternal Uncle        dialysis  . Cancer Maternal Grandmother        uterine  . Cancer Maternal Grandfather   . Cancer Paternal Grandfather        pancreatic    Social History   Socioeconomic History  . Marital status: Single    Spouse name: Not  on file  . Number of children: Not on file  . Years of education: 29  . Highest education level: Not on file  Occupational History  . Occupation: Counsellor lab  Social Needs  . Financial resource strain: Not on file  . Food insecurity:    Worry: Not on file    Inability: Not on file  . Transportation needs:    Medical: Not on file    Non-medical: Not on file  Tobacco Use  . Smoking status: Never Smoker  . Smokeless tobacco: Never Used  Substance and Sexual Activity  . Alcohol use: Yes    Alcohol/week: 0.0 oz    Comment: occ  . Drug use: No  . Sexual activity: Yes  Lifestyle  . Physical activity:    Days per week: Not on file    Minutes per session: Not on file  . Stress: Not on file  Relationships  . Social connections:  Talks on phone: Not on file    Gets together: Not on file    Attends religious service: Not on file    Active member of club or organization: Not on file    Attends meetings of clubs or organizations: Not on file    Relationship status: Not on file  . Intimate partner violence:    Fear of current or ex partner: Not on file    Emotionally abused: Not on file    Physically abused: Not on file    Forced sexual activity: Not on file  Other Topics Concern  . Not on file  Social History Narrative   Exercise-- no    Outpatient Medications Prior to Visit  Medication Sig Dispense Refill  . Ferrous Sulfate Dried (GNP IRON) 200 (65 FE) MG TABS Take 65 mg by mouth every other day.    . fluticasone (FLONASE) 50 MCG/ACT nasal spray Place 2 sprays into both nostrils daily. 16 g 1  . furosemide (LASIX) 20 MG tablet TAKE 1 TABLET BY MOUTH ONCE DAILY 90 tablet 0  . Lactobacillus (PROBIOTIC ACIDOPHILUS PO) Take by mouth.    . latanoprost (XALATAN) 0.005 % ophthalmic solution     . levocetirizine (XYZAL) 5 MG tablet Take 1 tablet (5 mg total) by mouth every evening. 30 tablet 3  . lisinopril (PRINIVIL,ZESTRIL) 10 MG tablet Take 1 tablet  (10 mg total) by mouth daily. 90 tablet 3  . lisinopril (PRINIVIL,ZESTRIL) 10 MG tablet TAKE 1 TABLET BY MOUTH ONCE DAILY 90 tablet 3  . Multiple Vitamin (MULTIVITAMIN) tablet Take 1 tablet by mouth daily.      . pravastatin (PRAVACHOL) 40 MG tablet TAKE 1 TABLET BY MOUTH ONCE DAILY 90 tablet 0  . amoxicillin-clavulanate (AUGMENTIN) 875-125 MG tablet     . levofloxacin (LEVAQUIN) 500 MG tablet Take 1 tablet (500 mg total) by mouth daily. 10 tablet 0   No facility-administered medications prior to visit.     Allergies  Allergen Reactions  . Oseltamivir Phosphate     Review of Systems  Constitutional: Negative for fever and malaise/fatigue.  HENT: Negative for congestion.   Eyes: Negative for blurred vision.  Respiratory: Negative for cough and shortness of breath.   Cardiovascular: Negative for chest pain, palpitations and leg swelling.  Gastrointestinal: Negative for vomiting.  Musculoskeletal: Positive for joint pain. Negative for back pain.       Left arm pain   Skin: Negative for rash.  Neurological: Positive for weakness (left arm). Negative for loss of consciousness and headaches.       Objective:    Physical Exam  Constitutional: She is oriented to person, place, and time. She appears well-developed and well-nourished.  HENT:  Head: Normocephalic and atraumatic.  Eyes: Conjunctivae and EOM are normal.  Neck: Normal range of motion. Neck supple. No JVD present. Carotid bruit is not present. No thyromegaly present.  Cardiovascular: Normal rate, regular rhythm and normal heart sounds.  No murmur heard. Pulmonary/Chest: Effort normal and breath sounds normal. No respiratory distress. She has no wheezes. She has no rales. She exhibits no tenderness.  Musculoskeletal: She exhibits tenderness. She exhibits no edema.       Left upper arm: She exhibits tenderness. She exhibits no bony tenderness, no swelling, no edema, no deformity and no laceration.       Arms: Neurological:  She is alert and oriented to person, place, and time.  Psychiatric: She has a normal mood and affect.  Nursing note and vitals reviewed.  BP 120/68 (BP Location: Right Arm, Cuff Size: Large)   Pulse 71   Temp 98 F (36.7 C) (Oral)   Resp 16   Ht 5\' 6"  (1.676 m)   Wt 249 lb 6.4 oz (113.1 kg)   SpO2 98%   BMI 40.25 kg/m  Wt Readings from Last 3 Encounters:  08/17/17 249 lb 6.4 oz (113.1 kg)  12/16/16 245 lb (111.1 kg)  06/26/16 260 lb 9.6 oz (118.2 kg)   BP Readings from Last 3 Encounters:  08/17/17 120/68  12/16/16 132/68  06/26/16 118/70     Immunization History  Administered Date(s) Administered  . Influenza Whole 01/31/2009, 02/20/2011  . Influenza-Unspecified 02/06/2014, 02/20/2015, 12/28/2015  . Pneumococcal Polysaccharide-23 05/25/2015  . Td 07/24/2008    Health Maintenance  Topic Date Due  . HIV Screening  07/09/1974  . PAP SMEAR  01/08/2016  . OPHTHALMOLOGY EXAM  01/20/2016  . FOOT EXAM  05/24/2016  . MAMMOGRAM  09/08/2017  . INFLUENZA VACCINE  11/19/2017  . HEMOGLOBIN A1C  02/13/2018  . TETANUS/TDAP  07/25/2018  . COLONOSCOPY  12/07/2019  . PNEUMOCOCCAL POLYSACCHARIDE VACCINE (2) 05/24/2020  . Hepatitis C Screening  Completed    Lab Results  Component Value Date   WBC 5.4 08/14/2017   HGB 13.0 08/14/2017   HCT 39.4 08/14/2017   PLT 173.0 08/14/2017   GLUCOSE 89 08/14/2017   CHOL 196 08/14/2017   TRIG 66.0 08/14/2017   HDL 66.00 08/14/2017   LDLDIRECT 128.0 01/03/2013   LDLCALC 117 (H) 08/14/2017   ALT 12 08/14/2017   AST 15 08/14/2017   NA 140 08/14/2017   K 4.9 08/14/2017   CL 104 08/14/2017   CREATININE 0.77 08/14/2017   BUN 20 08/14/2017   CO2 31 08/14/2017   TSH 1.59 08/14/2017   HGBA1C 6.1 08/14/2017   MICROALBUR 0.2 01/03/2013    Lab Results  Component Value Date   TSH 1.59 08/14/2017   Lab Results  Component Value Date   WBC 5.4 08/14/2017   HGB 13.0 08/14/2017   HCT 39.4 08/14/2017   MCV 77.2 (L) 08/14/2017   PLT  173.0 08/14/2017   Lab Results  Component Value Date   NA 140 08/14/2017   K 4.9 08/14/2017   CO2 31 08/14/2017   GLUCOSE 89 08/14/2017   BUN 20 08/14/2017   CREATININE 0.77 08/14/2017   BILITOT 0.4 08/14/2017   ALKPHOS 45 08/14/2017   AST 15 08/14/2017   ALT 12 08/14/2017   PROT 7.2 08/14/2017   ALBUMIN 3.9 08/14/2017   CALCIUM 9.3 08/14/2017   GFR 98.98 08/14/2017   Lab Results  Component Value Date   CHOL 196 08/14/2017   Lab Results  Component Value Date   HDL 66.00 08/14/2017   Lab Results  Component Value Date   LDLCALC 117 (H) 08/14/2017   Lab Results  Component Value Date   TRIG 66.0 08/14/2017   Lab Results  Component Value Date   CHOLHDL 3 08/14/2017   Lab Results  Component Value Date   HGBA1C 6.1 08/14/2017         Assessment & Plan:   Problem List Items Addressed This Visit      Unprioritized   Left arm pain - Primary    Musculoskeletal ---  antiinflamatory Consider sport med if no improvement        Relevant Medications   meloxicam (MOBIC) 15 MG tablet    Other Visit Diagnoses    Snoring       Relevant  Orders   Ambulatory referral to Neurology      I have discontinued Chantrell P. Hrdlicka's amoxicillin-clavulanate and levofloxacin. I am also having her start on meloxicam. Additionally, I am having her maintain her multivitamin, fluticasone, Ferrous Sulfate Dried, Lactobacillus (PROBIOTIC ACIDOPHILUS PO), latanoprost, levocetirizine, lisinopril, pravastatin, lisinopril, and furosemide.  Meds ordered this encounter  Medications  . meloxicam (MOBIC) 15 MG tablet    Sig: 1/2-1 po qd prn pain    Dispense:  30 tablet    Refill:  0    CMA served as scribe during this visit. History, Physical and Plan performed by medical provider. Documentation and orders reviewed and attested to.  Ann Held, DO

## 2017-08-17 NOTE — Patient Instructions (Signed)
Tendinitis Tendinitis is inflammation of a tendon. A tendon is a strong cord of tissue that connects muscle to bone. Tendinitis can affect any tendon, but it most commonly affects the shoulder tendon (rotator cuff), ankle tendon (Achilles tendon), elbow tendon (triceps tendon), or one of the tendons in the wrist. What are the causes? This condition may be caused by:  Overusing a tendon or muscle. This is common.  Age-related wear and tear.  Injury.  Inflammatory conditions, such as arthritis.  Certain medicines.  What increases the risk? This condition is more likely to develop in people who do activities that involve repetitive motions. What are the signs or symptoms? Symptoms of this condition may include:  Pain.  Tenderness.  Mild swelling.  How is this diagnosed? This condition is diagnosed with a physical exam. You may also have tests, such as:  Ultrasound. This uses sound waves to make an image of your affected area.  MRI.  How is this treated? This condition may be treated by resting, icing, applying pressure (compression), and raising (elevating) the area above the level of your heart. This is known as RICE therapy. Treatment may also include:  Medicines to help reduce inflammation or to help reduce pain.  Exercises or physical therapy to strengthen and stretch the tendon.  A brace or splint.  Surgery (rare).  Follow these instructions at home:  If you have a splint or brace:  Wear the splint or brace as told by your health care provider. Remove it only as told by your health care provider.  Loosen the splint or brace if your fingers or toes tingle, become numb, or turn cold and blue.  Do not take baths, swim, or use a hot tub until your health care provider approves. Ask your health care provider if you can take showers. You may only be allowed to take sponge baths for bathing.  Do not let your splint or brace get wet if it is not waterproof. ? If your  splint or brace is not waterproof, cover it with a watertight plastic bag when you take a bath or a shower.  Keep the splint or brace clean. Managing pain, stiffness, and swelling  If directed, apply ice to the affected area. ? Put ice in a plastic bag. ? Place a towel between your skin and the bag. ? Leave the ice on for 20 minutes, 2-3 times a day.  If directed, apply heat to the affected area as often as told by your health care provider. Use the heat source that your health care provider recommends, such as a moist heat pack or a heating pad. ? Place a towel between your skin and the heat source. ? Leave the heat on for 20-30 minutes. ? Remove the heat if your skin turns bright red. This is especially important if you are unable to feel pain, heat, or cold. You may have a greater risk of getting burned.  Move the fingers or toes of the affected limb often, if this applies. This can help to prevent stiffness and lessen swelling.  If directed, elevate the affected area above the level of your heart while you are sitting or lying down. Driving  Do not drive or operate heavy machinery while taking prescription pain medicine.  Ask your health care provider when it is safe to drive if you have a splint or brace on any part of your arm or leg. Activity  Return to your normal activities as told by your health care   provider. Ask your health care provider what activities are safe for you.  Rest the affected area as told by your health care provider.  Avoid using the affected area while you are experiencing symptoms of tendinitis.  Do exercises as told by your health care provider. General instructions  If you have a splint, do not put pressure on any part of the splint until it is fully hardened. This may take several hours.  Wear an elastic bandage or compression wrap only as told by your health care provider.  Take over-the-counter and prescription medicines only as told by your  health care provider.  Keep all follow-up visits as told by your health care provider. This is important. Contact a health care provider if:  Your symptoms do not improve.  You develop new, unexplained problems, such as numbness in your hands. This information is not intended to replace advice given to you by your health care provider. Make sure you discuss any questions you have with your health care provider. Document Released: 04/04/2000 Document Revised: 12/06/2015 Document Reviewed: 01/08/2015 Elsevier Interactive Patient Education  2018 [employer]  

## 2017-08-18 ENCOUNTER — Other Ambulatory Visit: Payer: Self-pay

## 2017-08-18 DIAGNOSIS — E7849 Other hyperlipidemia: Secondary | ICD-10-CM

## 2017-08-18 DIAGNOSIS — R739 Hyperglycemia, unspecified: Secondary | ICD-10-CM

## 2017-08-18 DIAGNOSIS — I10 Essential (primary) hypertension: Secondary | ICD-10-CM

## 2017-08-19 DIAGNOSIS — M79602 Pain in left arm: Secondary | ICD-10-CM | POA: Insufficient documentation

## 2017-08-19 NOTE — Assessment & Plan Note (Signed)
Musculoskeletal ---  antiinflamatory Consider sport med if no improvement

## 2017-08-22 ENCOUNTER — Other Ambulatory Visit: Payer: Self-pay | Admitting: Family Medicine

## 2017-08-24 ENCOUNTER — Ambulatory Visit (INDEPENDENT_AMBULATORY_CARE_PROVIDER_SITE_OTHER): Payer: 59 | Admitting: Family Medicine

## 2017-08-24 ENCOUNTER — Encounter: Payer: Self-pay | Admitting: Family Medicine

## 2017-08-24 VITALS — BP 128/72 | HR 76 | Temp 98.1°F | Resp 16 | Ht 66.0 in | Wt 251.6 lb

## 2017-08-24 DIAGNOSIS — J302 Other seasonal allergic rhinitis: Secondary | ICD-10-CM | POA: Diagnosis not present

## 2017-08-24 DIAGNOSIS — Z0000 Encounter for general adult medical examination without abnormal findings: Secondary | ICD-10-CM

## 2017-08-24 DIAGNOSIS — Z114 Encounter for screening for human immunodeficiency virus [HIV]: Secondary | ICD-10-CM

## 2017-08-24 MED ORDER — FLUTICASONE PROPIONATE 50 MCG/ACT NA SUSP: 2.0000 | Freq: Every day | NASAL | 1 refills | Status: DC

## 2017-08-24 NOTE — Progress Notes (Signed)
Subjective:     Heather [name] is a 57 y.o. female and is here for a comprehensive physical exam. The patient reports no problems.  Social History   Socioeconomic History  . Marital status: Single    Spouse name: Not on file  . Number of children: Not on file  . Years of education: 33  . Highest education level: Not on file  Occupational History  . Occupation: Counsellor lab  Social Needs  . Financial resource strain: Not on file  . Food insecurity:    Worry: Not on file    Inability: Not on file  . Transportation needs:    Medical: Not on file    Non-medical: Not on file  Tobacco Use  . Smoking status: Never Smoker  . Smokeless tobacco: Never Used  Substance and Sexual Activity  . Alcohol use: Yes    Alcohol/week: 0.0 oz    Comment: occ  . Drug use: No  . Sexual activity: Yes  Lifestyle  . Physical activity:    Days per week: Not on file    Minutes per session: Not on file  . Stress: Not on file  Relationships  . Social connections:    Talks on phone: Not on file    Gets together: Not on file    Attends religious service: Not on file    Active member of club or organization: Not on file    Attends meetings of clubs or organizations: Not on file    Relationship status: Not on file  . Intimate partner violence:    Fear of current or ex partner: Not on file    Emotionally abused: Not on file    Physically abused: Not on file    Forced sexual activity: Not on file  Other Topics Concern  . Not on file  Social History Narrative   Exercise-- no   Health Maintenance  Topic Date Due  . HIV Screening  07/09/1974  . MAMMOGRAM  09/08/2017  . INFLUENZA VACCINE  11/19/2017  . TETANUS/TDAP  07/25/2018  . COLONOSCOPY  12/07/2019  . Hepatitis C Screening  Completed    The following portions of the patient's history were reviewed and updated as appropriate:  She  has a past medical history of Acute bronchitis, Allergic rhinitis, Allergy,  Anemia, Arthritis, Glaucoma, Hyperlipidemia, and Hypertension. She does not have any pertinent problems on file. She  has a past surgical history that includes Abdominal hysterectomy (1998); Breast lumpectomy; and Cesarean section. Her family history includes Alzheimer's disease in her father; Atrial fibrillation in her father and unknown relative; Cancer in her maternal grandfather, maternal grandmother, and paternal grandfather; Cancer (age of onset: 35) in her father; Cancer - Colon (age of onset: 33) in her brother; Colon cancer in her brother; Colon cancer (age of onset: 53) in her cousin; Dementia in her father; Diabetes in her maternal aunt and maternal uncle; Endometrial cancer in her unknown relative; Hyperlipidemia in her brother and brother; Hypertension in her brother, brother, and brother; Kidney disease in her maternal uncle; Pulmonary embolism in her brother; Stroke in her father. She  reports that she has never smoked. She has never used smokeless tobacco. She reports that she drinks alcohol. She reports that she does not use drugs. She has a current medication list which includes the following prescription(s): ferrous sulfate dried, fluticasone, furosemide, lactobacillus, latanoprost, levocetirizine, lisinopril, meloxicam, multivitamin, and pravastatin. Current Outpatient Medications on File Prior to Visit  Medication Sig Dispense Refill  .  Ferrous Sulfate Dried (GNP IRON) 200 (65 FE) MG TABS Take 65 mg by mouth every other day.    . furosemide (LASIX) 20 MG tablet TAKE 1 TABLET BY MOUTH ONCE DAILY 90 tablet 0  . Lactobacillus (PROBIOTIC ACIDOPHILUS PO) Take by mouth.    . latanoprost (XALATAN) 0.005 % ophthalmic solution     . levocetirizine (XYZAL) 5 MG tablet Take 1 tablet (5 mg total) by mouth every evening. 30 tablet 3  . lisinopril (PRINIVIL,ZESTRIL) 10 MG tablet Take 1 tablet (10 mg total) by mouth daily. 90 tablet 3  . meloxicam (MOBIC) 15 MG tablet 1/2-1 po qd prn pain 30  tablet 0  . Multiple Vitamin (MULTIVITAMIN) tablet Take 1 tablet by mouth daily.      . pravastatin (PRAVACHOL) 40 MG tablet TAKE 1 TABLET BY MOUTH ONCE DAILY 90 tablet 0   No current facility-administered medications on file prior to visit.    She is allergic to oseltamivir phosphate..  Review of Systems Review of Systems  Constitutional: Negative for activity change, appetite change and fatigue.  HENT: Negative for hearing loss, congestion, tinnitus and ear discharge.  dentist q70m Eyes: Negative for visual disturbance (see optho q1y -- vision corrected to 20/20 with glasses).  Respiratory: Negative for cough, chest tightness and shortness of breath.   Cardiovascular: Negative for chest pain, palpitations and leg swelling.  Gastrointestinal: Negative for abdominal pain, diarrhea, constipation and abdominal distention.  Genitourinary: Negative for urgency, frequency, decreased urine volume and difficulty urinating.  Musculoskeletal: Negative for back pain, arthralgias and gait problem.  Skin: Negative for color change, pallor and rash.  Neurological: Negative for dizziness, light-headedness, numbness and headaches.  Hematological: Negative for adenopathy. Does not bruise/bleed easily.  Psychiatric/Behavioral: Negative for suicidal ideas, confusion, sleep disturbance, self-injury, dysphoric mood, decreased concentration and agitation.       Objective:    BP 128/72 (BP Location: Right Arm, Cuff Size: Large)   Pulse 76   Temp 98.1 F (36.7 C) (Oral)   Resp 16   Ht 5\' 6"  (1.676 m)   Wt 251 lb 9.6 oz (114.1 kg)   SpO2 96%   BMI 40.61 kg/m  General appearance: alert, cooperative, appears stated age and no distress Head: Normocephalic, without obvious abnormality, atraumatic Eyes: negative findings: lids and lashes normal, conjunctivae and sclerae normal and pupils equal, round, reactive to light and accomodation Ears: normal TM's and external ear canals both ears Nose: Nares  normal. Septum midline. Mucosa normal. No drainage or sinus tenderness. Throat: lips, mucosa, and tongue normal; teeth and gums normal Neck: no adenopathy, no carotid bruit, no JVD, supple, symmetrical, trachea midline and thyroid not enlarged, symmetric, no tenderness/mass/nodules Back: symmetric, no curvature. ROM normal. No CVA tenderness. Lungs: clear to auscultation bilaterally Breasts: normal appearance, no masses or tenderness Heart: regular rate and rhythm, S1, S2 normal, no murmur, click, rub or gallop Abdomen: soft, non-tender; bowel sounds normal; no masses,  no organomegaly Pelvic: not indicated; status post hysterectomy, negative ROS Extremities: extremities normal, atraumatic, no cyanosis or edema Pulses: 2+ and symmetric Skin: Skin color, texture, turgor normal. No rashes or lesions Lymph nodes: Cervical, supraclavicular, and axillary nodes normal. Neurologic: Alert and oriented X 3, normal strength and tone. Normal symmetric reflexes. Normal coordination and gait    Assessment:    Healthy female exam.      Plan:    ghm utd Labs reviewed  See After Visit Summary for Counseling Recommendations    1. Screening for HIV (human immunodeficiency  virus)  - HIV antibody  2. Seasonal allergies  - fluticasone (FLONASE) 50 MCG/ACT nasal spray; Place 2 sprays into both nostrils daily.  Dispense: 48 g; Refill: 1  3. Morbid obesity ([place])  - Amb Ref to Medical Weight Management

## 2017-08-24 NOTE — Patient Instructions (Signed)
Preventive Care 40-64 Years, Female Preventive care refers to lifestyle choices and visits with your health care provider that can promote health and wellness. What does preventive care include?  A yearly physical exam. This is also called an annual well check.  Dental exams once or twice a year.  Routine eye exams. Ask your health care provider how often you should have your eyes checked.  Personal lifestyle choices, including: ? Daily care of your teeth and gums. ? Regular physical activity. ? Eating a healthy diet. ? Avoiding tobacco and drug use. ? Limiting alcohol use. ? Practicing safe sex. ? Taking low-dose aspirin daily starting at age 58. ? Taking vitamin and mineral supplements as recommended by your health care provider. What happens during an annual well check? The services and screenings done by your health care provider during your annual well check will depend on your age, overall health, lifestyle risk factors, and family history of disease. Counseling Your health care provider may ask you questions about your:  Alcohol use.  Tobacco use.  Drug use.  Emotional well-being.  Home and relationship well-being.  Sexual activity.  Eating habits.  Work and work Statistician.  Method of birth control.  Menstrual cycle.  Pregnancy history.  Screening You may have the following tests or measurements:  Height, weight, and BMI.  Blood pressure.  Lipid and cholesterol levels. These may be checked every 5 years, or more frequently if you are over 81 years old.  Skin check.  Lung cancer screening. You may have this screening every year starting at age 78 if you have a 30-pack-year history of smoking and currently smoke or have quit within the past 15 years.  Fecal occult blood test (FOBT) of the stool. You may have this test every year starting at age 65.  Flexible sigmoidoscopy or colonoscopy. You may have a sigmoidoscopy every 5 years or a colonoscopy  every 10 years starting at age 30.  Hepatitis C blood test.  Hepatitis B blood test.  Sexually transmitted disease (STD) testing.  Diabetes screening. This is done by checking your blood sugar (glucose) after you have not eaten for a while (fasting). You may have this done every 1-3 years.  Mammogram. This may be done every 1-2 years. Talk to your health care provider about when you should start having regular mammograms. This may depend on whether you have a family history of breast cancer.  BRCA-related cancer screening. This may be done if you have a family history of breast, ovarian, tubal, or peritoneal cancers.  Pelvic exam and Pap test. This may be done every 3 years starting at age 80. Starting at age 36, this may be done every 5 years if you have a Pap test in combination with an HPV test.  Bone density scan. This is done to screen for osteoporosis. You may have this scan if you are at high risk for osteoporosis.  Discuss your test results, treatment options, and if necessary, the need for more tests with your health care provider. Vaccines Your health care provider may recommend certain vaccines, such as:  Influenza vaccine. This is recommended every year.  Tetanus, diphtheria, and acellular pertussis (Tdap, Td) vaccine. You may need a Td booster every 10 years.  Varicella vaccine. You may need this if you have not been vaccinated.  Zoster vaccine. You may need this after age 5.  Measles, mumps, and rubella (MMR) vaccine. You may need at least one dose of MMR if you were born in  1958 or later. You may also need a second dose.  Pneumococcal 13-valent conjugate (PCV13) vaccine. You may need this if you have certain conditions and were not previously vaccinated.  Pneumococcal polysaccharide (PPSV23) vaccine. You may need one or two doses if you smoke cigarettes or if you have certain conditions.  Meningococcal vaccine. You may need this if you have certain  conditions.  Hepatitis A vaccine. You may need this if you have certain conditions or if you travel or work in places where you may be exposed to hepatitis A.  Hepatitis B vaccine. You may need this if you have certain conditions or if you travel or work in places where you may be exposed to hepatitis B.  Haemophilus influenzae type b (Hib) vaccine. You may need this if you have certain conditions.  Talk to your health care provider about which screenings and vaccines you need and how often you need them. This information is not intended to replace advice given to you by your health care provider. Make sure you discuss any questions you have with your health care provider. Document Released: 05/04/2015 Document Revised: 12/26/2015 Document Reviewed: 02/06/2015 Elsevier Interactive Patient Education  2018 [employer]  

## 2017-08-25 LAB — HIV ANTIBODY (ROUTINE TESTING W REFLEX): HIV: NONREACTIVE

## 2017-09-08 ENCOUNTER — Encounter (INDEPENDENT_AMBULATORY_CARE_PROVIDER_SITE_OTHER): Payer: Self-pay

## 2017-09-10 DIAGNOSIS — Z1231 Encounter for screening mammogram for malignant neoplasm of breast: Secondary | ICD-10-CM | POA: Diagnosis not present

## 2017-09-10 LAB — HM MAMMOGRAPHY

## 2017-09-24 ENCOUNTER — Ambulatory Visit (INDEPENDENT_AMBULATORY_CARE_PROVIDER_SITE_OTHER): Payer: 59 | Admitting: Family Medicine

## 2017-09-24 ENCOUNTER — Encounter (INDEPENDENT_AMBULATORY_CARE_PROVIDER_SITE_OTHER): Payer: Self-pay | Admitting: Family Medicine

## 2017-09-24 VITALS — BP 126/78 | HR 64 | Temp 97.9°F | Ht 66.0 in | Wt 252.0 lb

## 2017-09-24 DIAGNOSIS — I10 Essential (primary) hypertension: Secondary | ICD-10-CM

## 2017-09-24 DIAGNOSIS — R0602 Shortness of breath: Secondary | ICD-10-CM

## 2017-09-24 DIAGNOSIS — R5383 Other fatigue: Secondary | ICD-10-CM

## 2017-09-24 DIAGNOSIS — E7849 Other hyperlipidemia: Secondary | ICD-10-CM

## 2017-09-24 DIAGNOSIS — R7303 Prediabetes: Secondary | ICD-10-CM

## 2017-09-24 DIAGNOSIS — Z9189 Other specified personal risk factors, not elsewhere classified: Secondary | ICD-10-CM

## 2017-09-24 DIAGNOSIS — Z1331 Encounter for screening for depression: Secondary | ICD-10-CM

## 2017-09-24 DIAGNOSIS — Z0289 Encounter for other administrative examinations: Secondary | ICD-10-CM

## 2017-09-24 DIAGNOSIS — Z6841 Body Mass Index (BMI) 40.0 and over, adult: Secondary | ICD-10-CM | POA: Diagnosis not present

## 2017-09-24 NOTE — Progress Notes (Signed)
Office: [phone]  /  Fax: [phone]   Dear Dr. Carollee Herter,   Thank you for referring Heather [name] to our clinic. The following note includes my evaluation and treatment recommendations.  HPI:   Chief Complaint: OBESITY    Heather [name] has been referred by Ann Held, DO for consultation regarding her obesity and obesity related comorbidities.    Heather [name] (MR# [id number]) is a 57 y.o. female who presents on 09/24/2017 for obesity evaluation and treatment. Current BMI is Body mass index is 40.67 kg/m.Heather [name] has been struggling with her weight for many years and has been unsuccessful in either losing weight, maintaining weight loss, or reaching her healthy weight goal.     Heather [name] attended our information session and states she is currently in the action stage of change and ready to dedicate time achieving and maintaining a healthier weight. Heather [name] is interested in becoming our patient and working on intensive lifestyle modifications including (but not limited to) diet, exercise and weight loss.    Heather [name] states her desired weight loss is 52 lbs she has been heavy most of  her life she started gaining weight after birth of twins her heaviest weight ever was 280 lbs she has significant food cravings issues  she snacks frequently in the evenings she is frequently drinking liquids with calories she frequently eats larger portions than normal  she struggles with emotional eating    Fatigue Heather [name] feels her energy is lower than it should be. This has worsened with weight gain and has not worsened recently. Heather [name] admits to daytime somnolence and  admits to waking up still tired. Patient is at risk for obstructive sleep apnea. Patent has a history of symptoms of daytime fatigue and morning headache. Patient generally gets 4 hours of sleep per night, and states they generally have nightime awakenings. Snoring is present. Apneic episodes  are not present. Epworth Sleepiness Score is 18.  Dyspnea on exertion Heather [name] notes increasing shortness of breath with exercising and seems to be worsening over time with weight gain. She notes getting out of breath sooner with activity than she used to. This has not gotten worse recently. Heather [name] denies orthopnea.  Pre-Diabetes Heather [name] has a diagnosis of pre-diabetes based on her elevated Hgb A1c and was informed this puts her at greater risk of developing diabetes. She is not on metformin and notes polyphagia, last A1c at 6.1. She denies nausea or hypoglycemia.  At risk for diabetes Heather [name] is at higher than average risk for developing diabetes due to her obesity and pre-diabetes. She currently admits polyuria and polydipsia.  Hypertension Heather [name] is a 58 y.o. female with hypertension. Heather [name] is stable on medications and she denies chest pain or dizziness. She is working weight loss to help control her blood pressure with the goal of decreasing her risk of heart attack and stroke. Heather [name]'s blood pressure is currently controlled.  Hyperlipidemia Heather [name] has hyperlipidemia, not on a statin. She denies chest pain, and attempting to improve her cholesterol levels with intensive lifestyle modification including a low saturated fat diet, exercise and weight loss. She denies any claudication or myalgias.  Depression Screen Heather [name]'s Food and Mood (modified PHQ-9) score was  Depression screen PHQ 2/9 09/24/2017  Decreased Interest 1  Down, Depressed, Hopeless 1  PHQ - 2 Score 2  Altered sleeping 1  Tired, decreased energy 1  Change in appetite 2  Feeling bad or failure about yourself  1  Trouble concentrating 1  Moving  slowly or fidgety/restless 1  Suicidal thoughts 0  PHQ-9 Score 9  Difficult doing work/chores Somewhat difficult    ALLERGIES: Allergies  Allergen Reactions  . Oseltamivir Phosphate     MEDICATIONS: Current Outpatient Medications on File  Prior to Visit  Medication Sig Dispense Refill  . fluticasone (FLONASE) 50 MCG/ACT nasal spray Place 2 sprays into both nostrils daily. 48 g 1  . furosemide (LASIX) 20 MG tablet TAKE 1 TABLET BY MOUTH ONCE DAILY 90 tablet 0  . Lactobacillus (PROBIOTIC ACIDOPHILUS PO) Take by mouth.    . latanoprost (XALATAN) 0.005 % ophthalmic solution     . levocetirizine (XYZAL) 5 MG tablet Take 1 tablet (5 mg total) by mouth every evening. 30 tablet 3  . lisinopril (PRINIVIL,ZESTRIL) 10 MG tablet Take 1 tablet (10 mg total) by mouth daily. 90 tablet 3  . meloxicam (MOBIC) 15 MG tablet 1/2-1 po qd prn pain 30 tablet 0  . Multiple Vitamin (MULTIVITAMIN) tablet Take 1 tablet by mouth daily.      . pravastatin (PRAVACHOL) 40 MG tablet TAKE 1 TABLET BY MOUTH ONCE DAILY 90 tablet 1   No current facility-administered medications on file prior to visit.     PAST MEDICAL HISTORY: Past Medical History:  Diagnosis Date  . Acute bronchitis   . Allergic rhinitis   . Allergy   . Anemia   . Anxiety   . Arthritis    knees  . Back pain   . Glaucoma   . Hyperlipidemia   . Hypertension   . Joint pain   . Leg edema   . Prediabetes     PAST SURGICAL HISTORY: Past Surgical History:  Procedure Laterality Date  . ABDOMINAL HYSTERECTOMY  1998  . BREAST LUMPECTOMY    . CESAREAN SECTION      SOCIAL HISTORY: Social History   Tobacco Use  . Smoking status: Never Smoker  . Smokeless tobacco: Never Used  Substance Use Topics  . Alcohol use: Yes    Alcohol/week: 0.0 oz    Comment: occ  . Drug use: No    FAMILY HISTORY: Family History  Problem Relation Age of Onset  . Dementia Father   . Stroke Father   . Cancer Father 77       prostate  . Atrial fibrillation Father   . Alzheimer's disease Father   . Hyperlipidemia Father   . Hypertension Father   . Heart disease Father   . Hyperlipidemia Brother   . Hypertension Brother   . Hyperlipidemia Brother   . Hypertension Brother   . Hypertension  Brother   . Pulmonary embolism Brother   . Cancer - Colon Brother 60       colon CA--stage 4  . Colon cancer Brother   . Colon cancer Cousin 70  . Hypertension Mother   . Hyperlipidemia Mother   . Heart disease Mother   . Endometrial cancer Unknown   . Atrial fibrillation Unknown   . Diabetes Maternal Aunt   . Diabetes Maternal Uncle   . Kidney disease Maternal Uncle        dialysis  . Cancer Maternal Grandmother        uterine  . Cancer Maternal Grandfather   . Cancer Paternal Grandfather        pancreatic    ROS: Review of Systems  Constitutional: Positive for malaise/fatigue. Negative for weight loss.       + Trouble sleeping  HENT: Positive for sinus pain.        +  Nasal discharge + Hay fever  Eyes:       + Vision changes + Wear glasses or contacts  Respiratory: Positive for shortness of breath (with exertion).   Cardiovascular: Negative for chest pain, orthopnea and claudication.       + Leg cramping  Gastrointestinal: Negative for nausea.  Genitourinary: Positive for frequency.  Musculoskeletal: Positive for back pain. Negative for myalgias.       + Muscle or joint pain + Muscle stiffness  Skin: Positive for itching.       + Dryness + Hair or nail changes  Neurological: Positive for dizziness, weakness and headaches.  Endo/Heme/Allergies: Positive for polydipsia.       Positive polyphagia Negative hypoglycemia  Psychiatric/Behavioral:       + Stress    PHYSICAL EXAM: Blood pressure 126/78, pulse 64, temperature 97.9 F (36.6 C), temperature source Oral, height 5\' 6"  (1.676 m), weight 252 lb (114.3 kg), SpO2 99 %. Body mass index is 40.67 kg/m. Physical Exam  Constitutional: She is oriented to person, place, and time. She appears well-developed and well-nourished.  HENT:  Head: Normocephalic and atraumatic.  Nose: Nose normal.  Eyes: EOM are normal. No scleral icterus.  Neck: Normal range of motion. Neck supple. No thyromegaly present.    Cardiovascular: Normal rate and regular rhythm.  Pulmonary/Chest: Effort normal. No respiratory distress.  Abdominal: Soft. There is no tenderness.  + Obesity  Musculoskeletal:  Range of Motion normal in all 4 extremities Trace edema noted in bilateral lower extremities  Neurological: She is alert and oriented to person, place, and time. Coordination normal.  Skin: Skin is warm and dry.  Psychiatric: She has a normal mood and affect. Her behavior is normal.  Vitals reviewed.   RECENT LABS AND TESTS: BMET    Component Value Date/Time   NA 140 08/14/2017 0813   K 4.9 08/14/2017 0813   CL 104 08/14/2017 0813   CO2 31 08/14/2017 0813   GLUCOSE 89 08/14/2017 0813   BUN 20 08/14/2017 0813   CREATININE 0.77 08/14/2017 0813   CREATININE 0.68 05/25/2015 1616   CALCIUM 9.3 08/14/2017 0813   GFRNONAA 111.88 12/04/2009 0820   GFRAA 98 01/18/2008 0804   Lab Results  Component Value Date   HGBA1C 6.1 08/14/2017   No results found for: INSULIN CBC    Component Value Date/Time   WBC 5.4 08/14/2017 0813   RBC 5.10 08/14/2017 0813   HGB 13.0 08/14/2017 0813   HCT 39.4 08/14/2017 0813   PLT 173.0 08/14/2017 0813   MCV 77.2 (L) 08/14/2017 0813   MCH 25.0 (L) 05/25/2015 1616   MCHC 32.9 08/14/2017 0813   RDW 15.6 (H) 08/14/2017 0813   LYMPHSABS 2.9 08/14/2017 0813   MONOABS 0.4 08/14/2017 0813   EOSABS 0.1 08/14/2017 0813   BASOSABS 0.1 08/14/2017 0813   Iron/TIBC/Ferritin/ %Sat No results found for: IRON, TIBC, FERRITIN, IRONPCTSAT Lipid Panel     Component Value Date/Time   CHOL 196 08/14/2017 0813   TRIG 66.0 08/14/2017 0813   HDL 66.00 08/14/2017 0813   CHOLHDL 3 08/14/2017 0813   VLDL 13.2 08/14/2017 0813   LDLCALC 117 (H) 08/14/2017 0813   LDLDIRECT 128.0 01/03/2013 0829   Hepatic Function Panel     Component Value Date/Time   PROT 7.2 08/14/2017 0813   ALBUMIN 3.9 08/14/2017 0813   AST 15 08/14/2017 0813   ALT 12 08/14/2017 0813   ALKPHOS 45 08/14/2017 0813    BILITOT 0.4 08/14/2017 0813  BILIDIR 0.1 05/25/2015 1616   IBILI 0.3 05/25/2015 1616      Component Value Date/Time   TSH 1.59 08/14/2017 0813   TSH 1.053 05/25/2015 1616   TSH 0.95 03/09/2014 0956    ECG  shows NSR with a rate of 65 BPM INDIRECT CALORIMETER done today shows a VO2 of 204 and a REE of 1423.  Her calculated basal metabolic rate is 1601 thus her basal metabolic rate is worse than expected.    ASSESSMENT AND PLAN: Other fatigue - Plan: EKG 12-Lead, CBC with Differential/Platelet, Comprehensive metabolic panel, VITAMIN D 25 Hydroxy (Vit-D Deficiency, Fractures), Vitamin B12, Folate, T3, T4, free, TSH  Shortness of breath on exertion  Pre-diabetes - Plan: Hemoglobin A1c, Insulin, random  Essential hypertension  Other hyperlipidemia - Plan: Lipid panel  Screening for depression  At risk for diabetes mellitus  Class 3 severe obesity with serious comorbidity and body mass index (BMI) of 40.0 to 44.9 in adult, unspecified obesity type (HCC)  PLAN:  Fatigue Zanae was informed that her fatigue may be related to obesity, depression or many other causes. Labs will be ordered, and in the meanwhile Scarlet has agreed to work on diet, exercise and weight loss to help with fatigue. Proper sleep hygiene was discussed including the need for 7-8 hours of quality sleep each night. A sleep study was not ordered based on symptoms and Epworth score.  Dyspnea on exertion Avryl's shortness of breath appears to be obesity related and exercise induced. She has agreed to work on weight loss and gradually increase exercise to treat her exercise induced shortness of breath. If Janey follows our instructions and loses weight without improvement of her shortness of breath, we will plan to refer to pulmonology. We will monitor this condition regularly. Blondie agrees to this plan.  Pre-Diabetes Marvell will continue to work on weight loss, exercise, and decreasing  simple carbohydrates in her diet to help decrease the risk of diabetes. We dicussed metformin including benefits and risks. She was informed that eating too many simple carbohydrates or too many calories at one sitting increases the likelihood of GI side effects. We will check labs, she is to start diet, and Carlicia agrees to follow up with our clinic in 2 weeks as directed to monitor her progress.  Diabetes risk counselling Maleiah was given extended (15 minutes) diabetes prevention counseling today. She is 57 y.o. female and has risk factors for diabetes including obesity and pre-diabetes. We discussed intensive lifestyle modifications today with an emphasis on weight loss as well as increasing exercise and decreasing simple carbohydrates in her diet.  Hypertension We discussed sodium restriction, working on healthy weight loss, and a regular exercise program as the means to achieve improved blood pressure control. Ikeya agreed with this plan and agreed to follow up as directed. We will continue to monitor her blood pressure as well as her progress with the above lifestyle modifications. We will check labs, she is to start diet, and she will continue her medications and will watch for signs of hypotension as she continues her lifestyle modifications. Arilyn agrees to follow up with our clinic in 2 weeks.  Hyperlipidemia Shilpa was informed of the American Heart Association Guidelines emphasizing intensive lifestyle modifications as the first line treatment for hyperlipidemia. We discussed many lifestyle modifications today in depth, and Marjani will continue to work on decreasing saturated fats such as fatty red meat, butter and many fried foods. She will also increase vegetables and lean protein in her diet  and continue to work on exercise and weight loss efforts. We will check labs and Alva agrees to follow up with our clinic in 2 weeks.  Depression Screen Taunja had a mildly  positive depression screening. Depression is commonly associated with obesity and often results in emotional eating behaviors. We will monitor this closely and work on CBT to help improve the non-hunger eating patterns. Referral to Psychology may be required if no improvement is seen as she continues in our clinic.  Obesity Ramie is currently in the action stage of change and her goal is to continue with weight loss efforts. I recommend Eboni begin the structured treatment plan as follows:  She has agreed to follow the Category 2 plan Izella has been instructed to eventually work up to a goal of 150 minutes of combined cardio and strengthening exercise per week for weight loss and overall health benefits. We discussed the following Behavioral Modification Strategies today: increasing lean protein intake, decreasing simple carbohydrates, and no skipping meals    She was informed of the importance of frequent follow up visits to maximize her success with intensive lifestyle modifications for her multiple health conditions. She was informed we would discuss her lab results at her next visit unless there is a critical issue that needs to be addressed sooner. Samyra agreed to keep her next visit at the agreed upon time to discuss these results.    OBESITY BEHAVIORAL INTERVENTION VISIT  Today's visit was # 1 out of 22.  Starting weight: 252 lbs Starting date: 09/24/17 Today's weight : 252 lbs  Today's date: 09/24/2017 Total lbs lost to date: 0 (Patients must lose 7 lbs in the first 6 months to continue with counseling)   ASK: We discussed the diagnosis of obesity with Heather [name] today and Kayleigh agreed to give [place] permission to discuss obesity behavioral modification therapy today.  ASSESS: Raegan has the diagnosis of obesity and her BMI today is 40.69 Zsazsa is in the action stage of change   ADVISE: Faigy was educated on the multiple health risks of obesity  as well as the benefit of weight loss to improve her health. She was advised of the need for long term treatment and the importance of lifestyle modifications.  AGREE: Multiple dietary modification options and treatment options were discussed and  Alysah agreed to the above obesity treatment plan.   I, Trixie Dredge, am acting as transcriptionist for Dennard Nip, MD  I have reviewed the above documentation for accuracy and completeness, and I agree with the above. -Dennard Nip, MD

## 2017-09-25 LAB — COMPREHENSIVE METABOLIC PANEL
ALBUMIN: 4.2 g/dL (ref 3.5–5.5)
ALK PHOS: 61 IU/L (ref 39–117)
ALT: 14 IU/L (ref 0–32)
AST: 17 IU/L (ref 0–40)
Albumin/Globulin Ratio: 1.4 (ref 1.2–2.2)
BUN / CREAT RATIO: 33 — AB (ref 9–23)
BUN: 24 mg/dL (ref 6–24)
Bilirubin Total: <0.2 mg/dL (ref 0.0–1.2)
CO2: 26 mmol/L (ref 20–29)
CREATININE: 0.72 mg/dL (ref 0.57–1.00)
Calcium: 9.9 mg/dL (ref 8.7–10.2)
Chloride: 102 mmol/L (ref 96–106)
GFR calc Af Amer: 107 mL/min/{1.73_m2} (ref >59)
GFR calc non Af Amer: 93 mL/min/{1.73_m2} (ref >59)
GLOBULIN, TOTAL: 3.1 g/dL (ref 1.5–4.5)
Glucose: 93 mg/dL (ref 65–99)
POTASSIUM: 4.1 mmol/L (ref 3.5–5.2)
SODIUM: 139 mmol/L (ref 134–144)
Total Protein: 7.3 g/dL (ref 6.0–8.5)

## 2017-09-25 LAB — CBC WITH DIFFERENTIAL/PLATELET
Basophils Absolute: 0 10*3/uL (ref 0.0–0.2)
Basos: 1 %
EOS (ABSOLUTE): 0.1 10*3/uL (ref 0.0–0.4)
EOS: 2 %
HEMATOCRIT: 40.7 % (ref 34.0–46.6)
HEMOGLOBIN: 12.6 g/dL (ref 11.1–15.9)
Immature Grans (Abs): 0 10*3/uL (ref 0.0–0.1)
Immature Granulocytes: 0 %
LYMPHS ABS: 2.8 10*3/uL (ref 0.7–3.1)
Lymphs: 50 %
MCH: 24.6 pg — AB (ref 26.6–33.0)
MCHC: 31 g/dL — AB (ref 31.5–35.7)
MCV: 80 fL (ref 79–97)
MONOCYTES: 5 %
MONOS ABS: 0.3 10*3/uL (ref 0.1–0.9)
NEUTROS ABS: 2.4 10*3/uL (ref 1.4–7.0)
Neutrophils: 42 %
Platelets: 207 10*3/uL (ref 150–450)
RBC: 5.12 x10E6/uL (ref 3.77–5.28)
RDW: 16 % — AB (ref 12.3–15.4)
WBC: 5.6 10*3/uL (ref 3.4–10.8)

## 2017-09-25 LAB — HEMOGLOBIN A1C
ESTIMATED AVERAGE GLUCOSE: 126 mg/dL
HEMOGLOBIN A1C: 6 % — AB (ref 4.8–5.6)

## 2017-09-25 LAB — VITAMIN B12: Vitamin B-12: >2000 pg/mL — ABNORMAL HIGH (ref 232–1245)

## 2017-09-25 LAB — T4, FREE: FREE T4: 1.17 ng/dL (ref 0.82–1.77)

## 2017-09-25 LAB — LIPID PANEL
CHOL/HDL RATIO: 3 ratio (ref 0.0–4.4)
Cholesterol, Total: 215 mg/dL — ABNORMAL HIGH (ref 100–199)
HDL: 72 mg/dL (ref >39)
LDL CALC: 127 mg/dL — AB (ref 0–99)
TRIGLYCERIDES: 79 mg/dL (ref 0–149)
VLDL Cholesterol Cal: 16 mg/dL (ref 5–40)

## 2017-09-25 LAB — FOLATE

## 2017-09-25 LAB — T3: T3 TOTAL: 114 ng/dL (ref 71–180)

## 2017-09-25 LAB — VITAMIN D 25 HYDROXY (VIT D DEFICIENCY, FRACTURES): VIT D 25 HYDROXY: 34.1 ng/mL (ref 30.0–100.0)

## 2017-09-25 LAB — INSULIN, RANDOM: INSULIN: 10.3 u[IU]/mL (ref 2.6–24.9)

## 2017-09-25 LAB — TSH: TSH: 1.53 u[IU]/mL (ref 0.450–4.500)

## 2017-09-28 ENCOUNTER — Encounter (INDEPENDENT_AMBULATORY_CARE_PROVIDER_SITE_OTHER): Payer: Self-pay | Admitting: Family Medicine

## 2017-09-28 NOTE — Telephone Encounter (Signed)
Please address

## 2017-09-30 ENCOUNTER — Encounter: Payer: Self-pay | Admitting: Family Medicine

## 2017-10-08 ENCOUNTER — Ambulatory Visit: Payer: 59 | Admitting: Neurology

## 2017-10-08 ENCOUNTER — Encounter: Payer: Self-pay | Admitting: Neurology

## 2017-10-08 VITALS — BP 116/65 | HR 62 | Ht 66.0 in | Wt 251.0 lb

## 2017-10-08 DIAGNOSIS — I951 Orthostatic hypotension: Secondary | ICD-10-CM

## 2017-10-08 DIAGNOSIS — M79622 Pain in left upper arm: Secondary | ICD-10-CM

## 2017-10-08 DIAGNOSIS — Z6841 Body Mass Index (BMI) 40.0 and over, adult: Secondary | ICD-10-CM

## 2017-10-08 DIAGNOSIS — R0683 Snoring: Secondary | ICD-10-CM | POA: Diagnosis not present

## 2017-10-08 DIAGNOSIS — E66813 Obesity, class 3: Secondary | ICD-10-CM

## 2017-10-08 DIAGNOSIS — I10 Essential (primary) hypertension: Secondary | ICD-10-CM

## 2017-10-08 DIAGNOSIS — G4719 Other hypersomnia: Secondary | ICD-10-CM

## 2017-10-08 DIAGNOSIS — I209 Angina pectoris, unspecified: Secondary | ICD-10-CM

## 2017-10-08 NOTE — Progress Notes (Signed)
SLEEP MEDICINE CLINIC   Provider:  Larey Seat, [place] D  Primary Care Physician:  Carollee Herter, Alferd Apa, DO    Chief Complaint  Patient presents with  . New Patient (Initial Visit)    pt alone, rm 10. pt states a month ago she was having pain in arm and she thought she was sleeing on it wrong and it was causing her not sleep through the night. varies sometimes she cant go to sleep and there are some nights she wakes up every hour on the hour. this pattern started up about 6 months ago. pt has been told that she snores. wakes up with dry mouth.     HPI:  Heather [name] is a 57 y.o. female , seen here in a referral  from Dr. Carollee Herter for a sleep evaluation,   The patient is a new patient to our practice. She is a 57 year old right-handed African-American lady that just recently developed a sleep disturbance about 6 months ago.  One new problem is that she has left arm and shoulder pain and that resting on her left has been uncomfortable but this is her preferred sleep position.  She sometimes sleeps on her back or on the other side.  She does snore, confirmed by her mother. She has been falling asleep on the sofa. She falls asleep reading a book, in a movie theater or theater,  In her bedroom it may take 1 hour(. She had similar sleepiness 26 years ago and stopped going to the movies) She has gained weight however now reached a BMI of 40 or above.  She has made arrangements to see Dr. Dennard Nip for medical weight management.  It was recommended that she would also be evaluated for sleep apnea.  Sleep medical history and family sleep history: The patient had no recent hospitalizations, she Carries a diagnosis of recurrent bronchitis but not frequent, allergic rhinitis, unspecified anemia unspecified osteoarthritis in both knees, glaucoma hyperlipidemia and hypertension- now has  hypotension   There is a  family history of diabetes kidney disease a maternal grandmother with uterine  cancer, her brother was diagnosed with colon cancer, brother also tested positive for hyperlipidemia, hypertension and at one time for pulmonary embolism.  Her father had suffered from Alzheimer's disease as well as atrial fibrillation.  None of these conditions has affected her.   Sleep habits are as follows:  Hypersomnia, usually Mrs. Dobrowolski will watch TV before she goes to bed at night and her bedtime is around midnight.  She rises at 5.30 there is not much time allocated for sleep, and if she sleeps poorly she struggles all day with fatigue and sleepiness. Work day does not allow for daytime naps.  She describes her bedroom is cool, quiet and dark and she prefers to sleep on her left side if she could.  She uses up to 2 pillows, in a flat bed, her bed is not adjustable.  Sometimes she has to put something between her knees and knee pain also does not interfere with her sleep at times.  She reports leg pain has been bothering her for years. She is dreaming, but not nightmarish. She sleep talks. She sleeps normally alone, but has a pet dog in the bedroom. 2-3 nocturias. Snoring yes , but nobody has told her about apnea.  She feels less restored in AM, power naps of 15- 30 minutes were her routine in the past , but only on week ends. She has hypotension.  Social history: Market researcher.   Single. Non smoker, drinks. seldomly wine , caffeine - no soda, coffee 2 -3 times a day, 12 ounces. " I can drink coffee and go to sleep"  Review of Systems: Out of a complete 14 system review, the patient complains of only the following symptoms, and all other reviewed systems are negative.  EDS , achiness soreness, obesity, snoring, dry mouth, sleep restriction, advil Pm as needed.      Epworth score 16, Fatigue severity score 47  , depression score n/a    Social History   Socioeconomic History  . Marital status: Single    Spouse name: Not on file  . Number of children: Not on file  . Years  of education: 28  . Highest education level: Not on file  Occupational History  . Occupation: Counsellor lab  Social Needs  . Financial resource strain: Not on file  . Food insecurity:    Worry: Not on file    Inability: Not on file  . Transportation needs:    Medical: Not on file    Non-medical: Not on file  Tobacco Use  . Smoking status: Never Smoker  . Smokeless tobacco: Never Used  Substance and Sexual Activity  . Alcohol use: Yes    Alcohol/week: 0.0 oz    Comment: occ  . Drug use: No  . Sexual activity: Yes  Lifestyle  . Physical activity:    Days per week: Not on file    Minutes per session: Not on file  . Stress: Not on file  Relationships  . Social connections:    Talks on phone: Not on file    Gets together: Not on file    Attends religious service: Not on file    Active member of club or organization: Not on file    Attends meetings of clubs or organizations: Not on file    Relationship status: Not on file  . Intimate partner violence:    Fear of current or ex partner: Not on file    Emotionally abused: Not on file    Physically abused: Not on file    Forced sexual activity: Not on file  Other Topics Concern  . Not on file  Social History Narrative   Exercise-- no    Family History  Problem Relation Age of Onset  . Dementia Father   . Stroke Father   . Cancer Father 11       prostate  . Atrial fibrillation Father   . Alzheimer's disease Father   . Hyperlipidemia Father   . Hypertension Father   . Heart disease Father   . Hyperlipidemia Brother   . Hypertension Brother   . Hyperlipidemia Brother   . Hypertension Brother   . Hypertension Brother   . Pulmonary embolism Brother   . Cancer - Colon Brother 60       colon CA--stage 4  . Colon cancer Brother   . Colon cancer Cousin 53  . Hypertension Mother   . Hyperlipidemia Mother   . Heart disease Mother   . Endometrial cancer Unknown   . Atrial fibrillation Unknown     . Diabetes Maternal Aunt   . Diabetes Maternal Uncle   . Kidney disease Maternal Uncle        dialysis  . Cancer Maternal Grandmother        uterine  . Cancer Maternal Grandfather   . Cancer Paternal Grandfather  pancreatic    Past Medical History:  Diagnosis Date  . Acute bronchitis   . Allergic rhinitis   . Allergy   . Anemia   . Anxiety   . Arthritis    knees  . Back pain   . Glaucoma   . Hyperlipidemia   . Hypertension   . Joint pain   . Leg edema   . Prediabetes     Past Surgical History:  Procedure Laterality Date  . ABDOMINAL HYSTERECTOMY  1998  . BREAST LUMPECTOMY    . CESAREAN SECTION    . MYOMECTOMY      Current Outpatient Medications  Medication Sig Dispense Refill  . fluticasone (FLONASE) 50 MCG/ACT nasal spray Place 2 sprays into both nostrils daily. 48 g 1  . furosemide (LASIX) 20 MG tablet TAKE 1 TABLET BY MOUTH ONCE DAILY 90 tablet 0  . Lactobacillus (PROBIOTIC ACIDOPHILUS PO) Take by mouth.    . latanoprost (XALATAN) 0.005 % ophthalmic solution     . levocetirizine (XYZAL) 5 MG tablet Take 1 tablet (5 mg total) by mouth every evening. 30 tablet 3  . lisinopril (PRINIVIL,ZESTRIL) 10 MG tablet Take 1 tablet (10 mg total) by mouth daily. 90 tablet 3  . meloxicam (MOBIC) 15 MG tablet 1/2-1 po qd prn pain 30 tablet 0  . Multiple Vitamin (MULTIVITAMIN) tablet Take 1 tablet by mouth daily.      . pravastatin (PRAVACHOL) 40 MG tablet TAKE 1 TABLET BY MOUTH ONCE DAILY 90 tablet 1   No current facility-administered medications for this visit.     Allergies as of 10/08/2017 - Review Complete 10/08/2017  Allergen Reaction Noted  . Oseltamivir phosphate  08/02/2010    Vitals: BP 116/65   Pulse 62   Ht 5\' 6"  (1.676 m)   Wt 251 lb (113.9 kg)   BMI 40.51 kg/m  Last Weight:  Wt Readings from Last 1 Encounters:  10/08/17 251 lb (113.9 kg)   VVO:HYWV mass index is 40.51 kg/m.     Last Height:   Ht Readings from Last 1 Encounters:  10/08/17  5\' 6"  (1.676 m)    Physical exam:  General: The patient is awake, alert and appears not in acute distress. The patient is well groomed. Head: Normocephalic, atraumatic. Neck is supple. Mallampati 2 neck circumference:14 . Nasal airflow patent ,  Retrognathia is  Mildly seen.  Cardiovascular:  Regular rate and rhythm , without  murmurs or carotid bruit, and without distended neck veins. Respiratory: Lungs are clear to auscultation. Skin:  Without evidence of edema, or rash, no goiter  Trunk: BMI is 40 plus . The patient's posture is erect   Neurologic exam : The patient is awake and alert, oriented to place and time.   Memory subjective   described as intact. Attention span & concentration ability appears normal.  Speech is fluent,  without  dysarthria, dysphonia or aphasia.  Mood and affect are appropriate.  Cranial nerves: Pupils are equal and briskly reactive to light. Funduscopic exam without evidence of pallor or edema. Extraocular movements  in vertical and horizontal planes intact and without nystagmus. Visual fields by finger perimetry are intact. Hearing to finger rub intact.  Facial sensation intact to fine touch. Facial motor strength is symmetric and tongue and uvula move midline. Shoulder shrug was symmetrical.  Motor exam:   Normal tone, muscle bulk and symmetric strength in all extremities. Had strong grip strength.  Sensory:   Left arm pain - now improved - not yet  diagnosed.  Fine touch, pinprick and vibration were tested in all extremities. Proprioception tested in the upper extremities was normal. Coordination:  Finger-to-nose maneuver  normal without evidence of ataxia, dysmetria or tremor. Gait and station: Patient walks without assistive device .Tandem gait is unfragmented. Turns with 3 Steps. Romberg testing is negative.  Deep tendon reflexes: in the  upper and lower extremities are symmetric and intact. Babinski maneuver response is  downgoing.    Assessment:   After physical and neurologic examination, review of laboratory studies,  Personal review of imaging studies, reports of other /same  Imaging studies, results of polysomnography and / or neurophysiology testing and pre-existing records as far as provided in visit., my assessment is   1) excessive daytime sleepiness in morbidly obese patient , recently forced to sleep on her back due to shoulder pain. EDS Epworth 16 points, but no associated cataplectic symptoms.  2) snoring , sleep talking an nocturia - needs  OSA evaluation.  3) sleep hygiene. Advance bedtime, no TV or electronic devices , no screen light, no late caffeine (  nocturia )   4) shoulder pain improved on meloxicam, referral to Dr Stefanie Libel.  50 obesity - continue with weight management.    The patient was advised of the nature of the diagnosed disorder , the treatment options and the  risks for general health and wellness arising from not treating the condition.   I spent more than 50 minutes of face to face time with the patient.  Greater than 50% of time was spent in counseling and coordination of care. We have discussed the diagnosis and differential and I answered the patient's questions.    Plan:  Treatment plan and additional workup : HST per [facility].  Rv after sleep study.    Larey Seat, MD 04/23/1592, 5:85 PM  Certified in Neurology by ABP[employer] Certified in [place] by [facility] [address], [place], [place] [address]

## 2017-10-08 NOTE — Addendum Note (Signed)
Addended by: Larey Seat on: 10/08/2017 04:52 PM   Modules accepted: Orders

## 2017-10-09 DIAGNOSIS — H401131 Primary open-angle glaucoma, bilateral, mild stage: Secondary | ICD-10-CM | POA: Diagnosis not present

## 2017-10-12 ENCOUNTER — Ambulatory Visit (INDEPENDENT_AMBULATORY_CARE_PROVIDER_SITE_OTHER): Payer: 59 | Admitting: Family Medicine

## 2017-10-12 VITALS — BP 113/70 | HR 58 | Temp 98.1°F | Ht 66.0 in | Wt 247.0 lb

## 2017-10-12 DIAGNOSIS — Z6839 Body mass index (BMI) 39.0-39.9, adult: Secondary | ICD-10-CM

## 2017-10-12 DIAGNOSIS — R7303 Prediabetes: Secondary | ICD-10-CM | POA: Diagnosis not present

## 2017-10-12 DIAGNOSIS — E559 Vitamin D deficiency, unspecified: Secondary | ICD-10-CM

## 2017-10-12 DIAGNOSIS — Z9189 Other specified personal risk factors, not elsewhere classified: Secondary | ICD-10-CM | POA: Diagnosis not present

## 2017-10-12 MED ORDER — VITAMIN D (ERGOCALCIFEROL) 1.25 MG (50000 UNIT) PO CAPS: 50000.0000 [IU] | ORAL_CAPSULE | ORAL | 0 refills | Status: DC

## 2017-10-12 NOTE — Progress Notes (Signed)
Office: [phone]  /  Fax: [phone]   HPI:   Chief Complaint: OBESITY Heather [name] is here to discuss her progress with her obesity treatment plan. She is on the Category 2 plan and is following her eating plan approximately 85 % of the time. She states she is exercising 0 minutes 0 times per week. Heather [name] did well on Category 2 plan, found eating all her protein difficult. Hunger was controlled and she did very well with meal prep.  Her weight is 247 lb (112 kg) today and has had a weight loss of 5 pounds over a period of 2 to 3 weeks since her last visit. She has lost 5 lbs since starting treatment with [place].  Vitamin D Deficiency Heather [name] has a new diagnosis of vitamin D deficiency. She is on multivitamins, she notes fatigue and denies nausea, vomiting or muscle weakness.  Pre-Diabetes Heather [name] has a diagnosis of pre-diabetes based on her elevated Hgb A1c and was informed this puts her at greater risk of developing diabetes. A1c at 6.0, not on metformin, and attempting to diet control. She note polyphagia improved on diet and denies hypoglycemia.    At risk for diabetes Heather [name] is at higher than average risk for developing diabetes due to her obesity and pre-diabetes. She currently denies polyuria or polydipsia.  ALLERGIES: Allergies  Allergen Reactions  . Oseltamivir Phosphate     MEDICATIONS: Current Outpatient Medications on File Prior to Visit  Medication Sig Dispense Refill  . fluticasone (FLONASE) 50 MCG/ACT nasal spray Place 2 sprays into both nostrils daily. 48 g 1  . furosemide (LASIX) 20 MG tablet TAKE 1 TABLET BY MOUTH ONCE DAILY 90 tablet 0  . Lactobacillus (PROBIOTIC ACIDOPHILUS PO) Take by mouth.    . latanoprost (XALATAN) 0.005 % ophthalmic solution     . levocetirizine (XYZAL) 5 MG tablet Take 1 tablet (5 mg total) by mouth every evening. 30 tablet 3  . lisinopril (PRINIVIL,ZESTRIL) 10 MG tablet Take 1 tablet (10 mg total) by mouth daily. 90 tablet 3    . meloxicam (MOBIC) 15 MG tablet 1/2-1 po qd prn pain 30 tablet 0  . Multiple Vitamin (MULTIVITAMIN) tablet Take 1 tablet by mouth daily.      . pravastatin (PRAVACHOL) 40 MG tablet TAKE 1 TABLET BY MOUTH ONCE DAILY 90 tablet 1   No current facility-administered medications on file prior to visit.     PAST MEDICAL HISTORY: Past Medical History:  Diagnosis Date  . Acute bronchitis   . Allergic rhinitis   . Allergy   . Anemia   . Anxiety   . Arthritis    knees  . Back pain   . Glaucoma   . Hyperlipidemia   . Hypertension   . Joint pain   . Leg edema   . Prediabetes     PAST SURGICAL HISTORY: Past Surgical History:  Procedure Laterality Date  . ABDOMINAL HYSTERECTOMY  1998  . BREAST LUMPECTOMY    . CESAREAN SECTION    . MYOMECTOMY      SOCIAL HISTORY: Social History   Tobacco Use  . Smoking status: Never Smoker  . Smokeless tobacco: Never Used  Substance Use Topics  . Alcohol use: Yes    Alcohol/week: 0.0 oz    Comment: occ  . Drug use: No    FAMILY HISTORY: Family History  Problem Relation Age of Onset  . Dementia Father   . Stroke Father   . Cancer Father 41  prostate  . Atrial fibrillation Father   . Alzheimer's disease Father   . Hyperlipidemia Father   . Hypertension Father   . Heart disease Father   . Hyperlipidemia Brother   . Hypertension Brother   . Hyperlipidemia Brother   . Hypertension Brother   . Hypertension Brother   . Pulmonary embolism Brother   . Cancer - Colon Brother 60       colon CA--stage 4  . Colon cancer Brother   . Colon cancer Cousin 13  . Hypertension Mother   . Hyperlipidemia Mother   . Heart disease Mother   . Endometrial cancer Unknown   . Atrial fibrillation Unknown   . Diabetes Maternal Aunt   . Diabetes Maternal Uncle   . Kidney disease Maternal Uncle        dialysis  . Cancer Maternal Grandmother        uterine  . Cancer Maternal Grandfather   . Cancer Paternal Grandfather        pancreatic     ROS: Review of Systems  Constitutional: Positive for malaise/fatigue and weight loss.  Gastrointestinal: Negative for nausea and vomiting.  Genitourinary: Negative for frequency.  Musculoskeletal:       Negative muscle weakness  Endo/Heme/Allergies: Negative for polydipsia.       Positive polyphagia Negative hypoglycemia    PHYSICAL EXAM: Blood pressure 113/70, pulse (!) 58, temperature 98.1 F (36.7 C), temperature source Oral, height 5\' 6"  (1.676 m), weight 247 lb (112 kg), SpO2 96 %. Body mass index is 39.87 kg/m. Physical Exam  Constitutional: She is oriented to person, place, and time. She appears well-developed and well-nourished.  Cardiovascular: Normal rate.  Pulmonary/Chest: Effort normal.  Musculoskeletal: Normal range of motion.  Neurological: She is oriented to person, place, and time.  Skin: Skin is warm and dry.  Psychiatric: She has a normal mood and affect. Her behavior is normal.  Vitals reviewed.   RECENT LABS AND TESTS: BMET    Component Value Date/Time   NA 139 09/24/2017 0921   K 4.1 09/24/2017 0921   CL 102 09/24/2017 0921   CO2 26 09/24/2017 0921   GLUCOSE 93 09/24/2017 0921   GLUCOSE 89 08/14/2017 0813   BUN 24 09/24/2017 0921   CREATININE 0.72 09/24/2017 0921   CREATININE 0.68 05/25/2015 1616   CALCIUM 9.9 09/24/2017 0921   GFRNONAA 93 09/24/2017 0921   GFRAA 107 09/24/2017 0921   Lab Results  Component Value Date   HGBA1C 6.0 (H) 09/24/2017   HGBA1C 6.1 08/14/2017   HGBA1C 6.1 06/30/2016   HGBA1C 6.1 03/09/2014   HGBA1C 6.3 12/04/2009   Lab Results  Component Value Date   INSULIN 10.3 09/24/2017   CBC    Component Value Date/Time   WBC 5.6 09/24/2017 0921   WBC 5.4 08/14/2017 0813   RBC 5.12 09/24/2017 0921   RBC 5.10 08/14/2017 0813   HGB 12.6 09/24/2017 0921   HCT 40.7 09/24/2017 0921   PLT 207 09/24/2017 0921   MCV 80 09/24/2017 0921   MCH 24.6 (L) 09/24/2017 0921   MCH 25.0 (L) 05/25/2015 1616   MCHC 31.0 (L)  09/24/2017 0921   MCHC 32.9 08/14/2017 0813   RDW 16.0 (H) 09/24/2017 0921   LYMPHSABS 2.8 09/24/2017 0921   MONOABS 0.4 08/14/2017 0813   EOSABS 0.1 09/24/2017 0921   BASOSABS 0.0 09/24/2017 0921   Iron/TIBC/Ferritin/ %Sat No results found for: IRON, TIBC, FERRITIN, IRONPCTSAT Lipid Panel     Component Value Date/Time  CHOL 215 (H) 09/24/2017 0921   TRIG 79 09/24/2017 0921   HDL 72 09/24/2017 0921   CHOLHDL 3.0 09/24/2017 0921   CHOLHDL 3 08/14/2017 0813   VLDL 13.2 08/14/2017 0813   LDLCALC 127 (H) 09/24/2017 0921   LDLDIRECT 128.0 01/03/2013 0829   Hepatic Function Panel     Component Value Date/Time   PROT 7.3 09/24/2017 0921   ALBUMIN 4.2 09/24/2017 0921   AST 17 09/24/2017 0921   ALT 14 09/24/2017 0921   ALKPHOS 61 09/24/2017 0921   BILITOT <0.2 09/24/2017 0921   BILIDIR 0.1 05/25/2015 1616   IBILI 0.3 05/25/2015 1616      Component Value Date/Time   TSH 1.530 09/24/2017 0921   TSH 1.59 08/14/2017 0813   TSH 1.053 05/25/2015 1616  Results for GENESSIS, FLANARY (MRN [id number]) as of 10/12/2017 16:11  Ref. Range 09/24/2017 09:21  Vitamin D, 25-Hydroxy Latest Ref Range: 30.0 - 100.0 ng/mL 34.1    ASSESSMENT AND PLAN: Vitamin D deficiency - Plan: Vitamin D, Ergocalciferol, (DRISDOL) 50000 units CAPS capsule  Prediabetes  At risk for diabetes mellitus  Class 2 severe obesity with serious comorbidity and body mass index (BMI) of 39.0 to 39.9 in adult, unspecified obesity type ([place])  PLAN:  Vitamin D Deficiency Cheyenna was informed that low vitamin D levels contributes to fatigue and are associated with obesity, breast, and colon cancer. Jolane agrees to start prescription Vit D @50 ,000 IU every week #4 with no refills. She will follow up for routine testing of vitamin D, at least 2-3 times per year. She was informed of the risk of over-replacement of vitamin D and agrees to not increase her dose unless she discusses this with [place] first. Kaelynne agrees to  follow up with our clinic in 2 to 3 weeks.  Pre-Diabetes Marianna will continue to work on weight loss, diet, exercise, and decreasing simple carbohydrates in her diet to help decrease the risk of diabetes. We dicussed metformin including benefits and risks. She was informed that eating too many simple carbohydrates or too many calories at one sitting increases the likelihood of GI side effects. Marrisa declined metformin for now and a prescription was not written today. We will recheck labs in 3 months. Khushi agrees to follow up with our clinic in 2 to 3 weeks as directed to monitor her progress.  Diabetes risk counselling Bambie was given extended (30 minutes) diabetes prevention counseling today. She is 57 y.o. female and has risk factors for diabetes including obesity and pre-diabetes. We discussed intensive lifestyle modifications today with an emphasis on weight loss as well as increasing exercise and decreasing simple carbohydrates in her diet.  Obesity Maevyn is currently in the action stage of change. As such, her goal is to continue with weight loss efforts She has agreed to follow the Category 2 plan Sanyiah has been instructed to work up to a goal of 150 minutes of combined cardio and strengthening exercise per week for weight loss and overall health benefits. We discussed the following Behavioral Modification Strategies today: increasing lean protein intake and decreasing simple carbohydrates    Milan has agreed to follow up with our clinic in 2 to 3 weeks. She was informed of the importance of frequent follow up visits to maximize her success with intensive lifestyle modifications for her multiple health conditions.   OBESITY BEHAVIORAL INTERVENTION VISIT  Today's visit was # 2 out of 22.  Starting weight: 252 lbs Starting date: 09/24/17 Today's weight : 247 lbs  Today's date: 10/12/2017 Total lbs lost to date: 5 (Patients must lose 7 lbs in the first 6 months  to continue with counseling)   ASK: We discussed the diagnosis of obesity with Glenice Bow today and Satcha agreed to give [place] permission to discuss obesity behavioral modification therapy today.  ASSESS: Daniqua has the diagnosis of obesity and her BMI today is 39.89 Aurelie is in the action stage of change   ADVISE: Laloni was educated on the multiple health risks of obesity as well as the benefit of weight loss to improve her health. She was advised of the need for long term treatment and the importance of lifestyle modifications.  AGREE: Multiple dietary modification options and treatment options were discussed and  Andreka agreed to the above obesity treatment plan.  I, Trixie Dredge, am acting as transcriptionist for Dennard Nip, MD  I have reviewed the above documentation for accuracy and completeness, and I agree with the above. -Dennard Nip, MD

## 2017-10-13 ENCOUNTER — Ambulatory Visit: Payer: 59 | Admitting: Family Medicine

## 2017-10-13 ENCOUNTER — Encounter: Payer: Self-pay | Admitting: Family Medicine

## 2017-10-13 DIAGNOSIS — G8929 Other chronic pain: Secondary | ICD-10-CM

## 2017-10-13 DIAGNOSIS — M25561 Pain in right knee: Secondary | ICD-10-CM | POA: Diagnosis not present

## 2017-10-13 DIAGNOSIS — M25511 Pain in right shoulder: Secondary | ICD-10-CM | POA: Diagnosis not present

## 2017-10-13 DIAGNOSIS — M25562 Pain in left knee: Secondary | ICD-10-CM

## 2017-10-13 NOTE — Progress Notes (Signed)
Subjective:  PCP: Dr. Carollee Herter Consultation requested by Dr. Brett Fairy   Patient ID: Heather [name] is a 57 y.o. female.  Chief Complaint: Right shoulder and bilateral knee pain  HPI: Patient presents with a 1 month history of right shoulder pain. She says that she often sleeps on her shoulders and believes that could be the cause of her pain. She denies any injury or decreased ROM. Pain is 4/10 in severity at its worst and only hurts anteriorly when she adducts and internally rotates her arm. She has not tried any analgesics, ice, or heat but she is attempting to sleep supine more which has been helping. She was having a similar pain in her left shoulder 2 months ago but saw her PCP who prescribed meloxicam and after about 2 weeks she was symptom free.   She also complains of bilateral knee pain that has been occurring sporadically at night for the past few years. She denies any swelling or pain when walking or going up and down stairs. She describes the pain as a pressure that occurs in different areas of the knee, at night only, and is relieved by placing a massaging rod under her knee. By morning the pain is always gone. She denies any swelling, redness, or warmth. Denies any prior or recent injuries but says she was told when she was younger that her patella moves more than it should.    ROS: Constitutional: Negative for fever, chills, diaphoresis. Skin: Negative for erythema, ecchymosis. MSK: See HPI above. Neuro: Negative for numbness, tingling.  Past Medical History:  Diagnosis Date  . Acute bronchitis   . Allergic rhinitis   . Allergy   . Anemia   . Anxiety   . Arthritis    knees  . Back pain   . Glaucoma   . Hyperlipidemia   . Hypertension   . Joint pain   . Leg edema   . Prediabetes     Current Outpatient Medications on File Prior to Visit  Medication Sig Dispense Refill  . fluticasone (FLONASE) 50 MCG/ACT nasal spray Place 2 sprays into both nostrils daily.  48 g 1  . furosemide (LASIX) 20 MG tablet TAKE 1 TABLET BY MOUTH ONCE DAILY 90 tablet 0  . Lactobacillus (PROBIOTIC ACIDOPHILUS PO) Take by mouth.    . latanoprost (XALATAN) 0.005 % ophthalmic solution     . levocetirizine (XYZAL) 5 MG tablet Take 1 tablet (5 mg total) by mouth every evening. 30 tablet 3  . lisinopril (PRINIVIL,ZESTRIL) 10 MG tablet Take 1 tablet (10 mg total) by mouth daily. 90 tablet 3  . meloxicam (MOBIC) 15 MG tablet 1/2-1 po qd prn pain 30 tablet 0  . Multiple Vitamin (MULTIVITAMIN) tablet Take 1 tablet by mouth daily.      . pravastatin (PRAVACHOL) 40 MG tablet TAKE 1 TABLET BY MOUTH ONCE DAILY 90 tablet 1  . Vitamin D, Ergocalciferol, (DRISDOL) 50000 units CAPS capsule Take 1 capsule (50,000 Units total) by mouth every 7 (seven) days. 4 capsule 0   No current facility-administered medications on file prior to visit.     Past Surgical History:  Procedure Laterality Date  . ABDOMINAL HYSTERECTOMY  1998  . BREAST LUMPECTOMY    . CESAREAN SECTION    . MYOMECTOMY      Allergies  Allergen Reactions  . Oseltamivir Phosphate     Social History   Socioeconomic History  . Marital status: Single    Spouse name: Not on file  .  Number of children: Not on file  . Years of education: 79  . Highest education level: Not on file  Occupational History  . Occupation: Counsellor lab  Social Needs  . Financial resource strain: Not on file  . Food insecurity:    Worry: Not on file    Inability: Not on file  . Transportation needs:    Medical: Not on file    Non-medical: Not on file  Tobacco Use  . Smoking status: Never Smoker  . Smokeless tobacco: Never Used  Substance and Sexual Activity  . Alcohol use: Yes    Alcohol/week: 0.0 oz    Comment: occ  . Drug use: No  . Sexual activity: Yes  Lifestyle  . Physical activity:    Days per week: Not on file    Minutes per session: Not on file  . Stress: Not on file  Relationships  . Social  connections:    Talks on phone: Not on file    Gets together: Not on file    Attends religious service: Not on file    Active member of club or organization: Not on file    Attends meetings of clubs or organizations: Not on file    Relationship status: Not on file  . Intimate partner violence:    Fear of current or ex partner: Not on file    Emotionally abused: Not on file    Physically abused: Not on file    Forced sexual activity: Not on file  Other Topics Concern  . Not on file  Social History Narrative   Exercise-- no    Family History  Problem Relation Age of Onset  . Dementia Father   . Stroke Father   . Cancer Father 37       prostate  . Atrial fibrillation Father   . Alzheimer's disease Father   . Hyperlipidemia Father   . Hypertension Father   . Heart disease Father   . Hyperlipidemia Brother   . Hypertension Brother   . Hyperlipidemia Brother   . Hypertension Brother   . Hypertension Brother   . Pulmonary embolism Brother   . Cancer - Colon Brother 60       colon CA--stage 4  . Colon cancer Brother   . Colon cancer Cousin 21  . Hypertension Mother   . Hyperlipidemia Mother   . Heart disease Mother   . Endometrial cancer Unknown   . Atrial fibrillation Unknown   . Diabetes Maternal Aunt   . Diabetes Maternal Uncle   . Kidney disease Maternal Uncle        dialysis  . Cancer Maternal Grandmother        uterine  . Cancer Maternal Grandfather   . Cancer Paternal Grandfather        pancreatic    BP 130/87   Pulse 72   Ht 5\' 6"  (1.676 m)   Wt 248 lb (112.5 kg)   BMI 40.03 kg/m     Objective:  Physical Exam: Gen: NAD, comfortable in exam room  Right shoulder: No swelling, ecchymoses.  No gross deformity. Mild TTP over anterior glenohumeral joint. FROM, pain with adduction and internal rotation. Equivocal Hawkins, negative Neers. Negative Yergasons. Strength 5/5 with empty can and resisted internal/external rotation without pain. Negative  apprehension. NV intact distally.   Left shoulder: No swelling, ecchymoses.  No gross deformity. No TTP. FROM. Strength 5/5 with empty can and resisted internal/external rotation. NV intact distally.  Right knee:  No gross deformity, ecchymoses, swelling. No TTP. FROM with 5/5 strength. Negative ant/post drawers. Negative valgus/varus testing. Negative mcmurrays, apleys, patellar apprehension. NV intact distally.  Left knee: No gross deformity, ecchymoses, swelling. No TTP. FROM with 5/5 strength. Negative ant/post drawers. Negative valgus/varus testing. Negative mcmurrays, apleys, patellar apprehension. NV intact distally.  Assessment:  Shoulder pain, right (M25.511) Bilateral knee pain (M25.561)   Plan:  Patients symptoms and exam are consistent with right rotator cuff impingement, specifically of the subscapularis. Further imaging not warranted at this time. Differential included rotator cuff strain and glenohumeral or AC joint arthritis. She was educated on this and given at home exercises. We recommended she take meloxicam or naproxen as needed for pain.  Declined physical therapy for now - consider this, injection, nitro patches, imaging if not improving.  She will follow up in 6 weeks.   Knee exam was reassuring and does not fit a typical arthritis pattern but instead seems to be more consistent with patellar/quad tendon strain/spasms based on her location of pain. We have recommended strengthening exercises and would like her to keep track of how often she has episodes.   All questions and concerns were answered to her satisfaction.

## 2017-10-13 NOTE — Assessment & Plan Note (Signed)
Knee exam was reassuring and does not fit a typical arthritis pattern but instead seems to be more consistent with patellar/quad tendon strain/spasms based on her location of pain. We have recommended strengthening exercises and would like her to keep track of how often she has episodes.

## 2017-10-13 NOTE — Patient Instructions (Signed)
Do the strengthening exercises of your legs (knee extensions, straight leg raises, hamstring curls) 3 sets of 10 once a day. Add ankle weight if these become too easy.  You have rotator cuff impingement of your subscapularis. Try to avoid painful activities (overhead activities, lifting with extended arm) as much as possible. Aleve 2 tabs twice a day with food OR meloxicam daily with food for pain and inflammation. Can take tylenol in addition to this. Do home exercise program with theraband and scapular stabilization exercises daily 3 sets of 10 once a day. If not improving at follow-up we will consider imaging, injection, physical therapy, and/or nitro patches. Follow up with me in 6 weeks.

## 2017-10-13 NOTE — Assessment & Plan Note (Signed)
Patients symptoms and exam are consistent with right rotator cuff impingement, specifically of the subscapularis. Further imaging not warranted at this time. Differential included rotator cuff strain and glenohumeral or AC joint arthritis. She was educated on this and given at home exercises. We recommended she take meloxicam or naproxen as needed for pain.  Declined physical therapy for now - consider this, injection, nitro patches, imaging if not improving.  She will follow up in 6 weeks.

## 2017-10-15 DIAGNOSIS — I8312 Varicose veins of left lower extremity with inflammation: Secondary | ICD-10-CM | POA: Diagnosis not present

## 2017-10-15 DIAGNOSIS — I8311 Varicose veins of right lower extremity with inflammation: Secondary | ICD-10-CM | POA: Diagnosis not present

## 2017-10-15 DIAGNOSIS — I83813 Varicose veins of bilateral lower extremities with pain: Secondary | ICD-10-CM | POA: Diagnosis not present

## 2017-10-23 ENCOUNTER — Other Ambulatory Visit: Payer: Self-pay | Admitting: Family Medicine

## 2017-10-30 ENCOUNTER — Encounter (INDEPENDENT_AMBULATORY_CARE_PROVIDER_SITE_OTHER): Payer: Self-pay | Admitting: Family Medicine

## 2017-11-03 ENCOUNTER — Encounter (INDEPENDENT_AMBULATORY_CARE_PROVIDER_SITE_OTHER): Payer: Self-pay

## 2017-11-03 ENCOUNTER — Ambulatory Visit (INDEPENDENT_AMBULATORY_CARE_PROVIDER_SITE_OTHER): Payer: 59 | Admitting: Family Medicine

## 2017-11-04 DIAGNOSIS — I8311 Varicose veins of right lower extremity with inflammation: Secondary | ICD-10-CM | POA: Diagnosis not present

## 2017-11-04 DIAGNOSIS — I8312 Varicose veins of left lower extremity with inflammation: Secondary | ICD-10-CM | POA: Diagnosis not present

## 2017-11-16 ENCOUNTER — Ambulatory Visit (INDEPENDENT_AMBULATORY_CARE_PROVIDER_SITE_OTHER): Payer: 59 | Admitting: Family Medicine

## 2017-11-16 ENCOUNTER — Encounter (INDEPENDENT_AMBULATORY_CARE_PROVIDER_SITE_OTHER): Payer: Self-pay

## 2017-11-17 ENCOUNTER — Ambulatory Visit (INDEPENDENT_AMBULATORY_CARE_PROVIDER_SITE_OTHER): Payer: 59 | Admitting: Family Medicine

## 2017-11-17 VITALS — BP 117/72 | HR 59 | Temp 98.0°F | Ht 66.0 in | Wt 253.0 lb

## 2017-11-17 DIAGNOSIS — Z9189 Other specified personal risk factors, not elsewhere classified: Secondary | ICD-10-CM

## 2017-11-17 DIAGNOSIS — Z6841 Body Mass Index (BMI) 40.0 and over, adult: Secondary | ICD-10-CM

## 2017-11-17 DIAGNOSIS — E559 Vitamin D deficiency, unspecified: Secondary | ICD-10-CM

## 2017-11-17 DIAGNOSIS — I10 Essential (primary) hypertension: Secondary | ICD-10-CM | POA: Diagnosis not present

## 2017-11-17 MED ORDER — VITAMIN D (ERGOCALCIFEROL) 1.25 MG (50000 UNIT) PO CAPS: 50000.0000 [IU] | ORAL_CAPSULE | ORAL | 0 refills | Status: DC

## 2017-11-18 ENCOUNTER — Ambulatory Visit: Payer: 59 | Admitting: Neurology

## 2017-11-18 DIAGNOSIS — G4719 Other hypersomnia: Secondary | ICD-10-CM

## 2017-11-18 DIAGNOSIS — I209 Angina pectoris, unspecified: Secondary | ICD-10-CM

## 2017-11-18 DIAGNOSIS — Z6841 Body Mass Index (BMI) 40.0 and over, adult: Secondary | ICD-10-CM

## 2017-11-18 DIAGNOSIS — I10 Essential (primary) hypertension: Secondary | ICD-10-CM

## 2017-11-18 DIAGNOSIS — R0683 Snoring: Secondary | ICD-10-CM

## 2017-11-18 DIAGNOSIS — I951 Orthostatic hypotension: Secondary | ICD-10-CM

## 2017-11-18 NOTE — Progress Notes (Signed)
Office: [phone]  /  Fax: [phone]   HPI:   Chief Complaint: OBESITY Heather [name] is here to discuss her progress with her obesity treatment plan. She is on the Category 2 plan and is following her eating plan approximately 65 % of the time. She states she is exercising 0 minutes 0 times per week. Heather [name] is following her category 2 plan at breakfast and lunch; and then she is stress eating with snacks and dinner. Her weight is 253 lb (114.8 kg) today and has had a weight gain of 6 pounds over a period of 5 weeks since her last visit. She has lost 1 lb since starting treatment with [place].  Vitamin D deficiency Heather [name] has a diagnosis of vitamin D deficiency. She is currently taking vit D and admits fatigue, but denies nausea, vomiting or muscle weakness.  At risk for osteopenia and osteoporosis Heather [name] is at higher risk of osteopenia and osteoporosis due to vitamin D deficiency.   Hypertension Heather [name] Heather [name] is a 57 y.o. female with hypertension.  [name] denies chest pain, chest pressure or headache. She is working weight loss to help control her blood pressure with the goal of decreasing her risk of heart attack and stroke. Heather [name] blood pressure is currently controlled.  ALLERGIES: Allergies  Allergen Reactions  . Oseltamivir Phosphate     MEDICATIONS: Current Outpatient Medications on File Prior to Visit  Medication Sig Dispense Refill  . fluticasone (FLONASE) 50 MCG/ACT nasal spray Place 2 sprays into both nostrils daily. 48 g 1  . furosemide (LASIX) 20 MG tablet TAKE 1 TABLET BY MOUTH ONCE DAILY 90 tablet 0  . Lactobacillus (PROBIOTIC ACIDOPHILUS PO) Take by mouth.    . latanoprost (XALATAN) 0.005 % ophthalmic solution     . levocetirizine (XYZAL) 5 MG tablet Take 1 tablet (5 mg total) by mouth every evening. 30 tablet 3  . lisinopril (PRINIVIL,ZESTRIL) 10 MG tablet Take 1 tablet (10 mg total) by mouth daily. 90 tablet 3  . meloxicam (MOBIC) 15 MG  tablet 1/2-1 po qd prn pain 30 tablet 0  . Multiple Vitamin (MULTIVITAMIN) tablet Take 1 tablet by mouth daily.      . pravastatin (PRAVACHOL) 40 MG tablet TAKE 1 TABLET BY MOUTH ONCE DAILY 90 tablet 1   No current facility-administered medications on file prior to visit.     PAST MEDICAL HISTORY: Past Medical History:  Diagnosis Date  . Acute bronchitis   . Allergic rhinitis   . Allergy   . Anemia   . Anxiety   . Arthritis    knees  . Back pain   . Glaucoma   . Hyperlipidemia   . Hypertension   . Joint pain   . Leg edema   . Prediabetes     PAST SURGICAL HISTORY: Past Surgical History:  Procedure Laterality Date  . ABDOMINAL HYSTERECTOMY  1998  . BREAST LUMPECTOMY    . CESAREAN SECTION    . MYOMECTOMY      SOCIAL HISTORY: Social History   Tobacco Use  . Smoking status: Never Smoker  . Smokeless tobacco: Never Used  Substance Use Topics  . Alcohol use: Yes    Alcohol/week: 0.0 oz    Comment: occ  . Drug use: No    FAMILY HISTORY: Family History  Problem Relation Age of Onset  . Dementia Father   . Stroke Father   . Cancer Father 47       prostate  . Atrial fibrillation Father   .  Alzheimer's disease Father   . Hyperlipidemia Father   . Hypertension Father   . Heart disease Father   . Hyperlipidemia Brother   . Hypertension Brother   . Hyperlipidemia Brother   . Hypertension Brother   . Hypertension Brother   . Pulmonary embolism Brother   . Cancer - Colon Brother 60       colon CA--stage 4  . Colon cancer Brother   . Colon cancer Cousin 38  . Hypertension Mother   . Hyperlipidemia Mother   . Heart disease Mother   . Endometrial cancer Unknown   . Atrial fibrillation Unknown   . Diabetes Maternal Aunt   . Diabetes Maternal Uncle   . Kidney disease Maternal Uncle        dialysis  . Cancer Maternal Grandmother        uterine  . Cancer Maternal Grandfather   . Cancer Paternal Grandfather        pancreatic    ROS: Review of Systems    Constitutional: Positive for malaise/fatigue. Negative for weight loss.  Cardiovascular: Negative for chest pain.       Negative for chest pressure  Gastrointestinal: Negative for nausea and vomiting.  Musculoskeletal:       Negative for muscle weakness  Neurological: Negative for headaches.    PHYSICAL EXAM: Blood pressure 117/72, pulse (!) 59, temperature 98 F (36.7 C), temperature source Oral, height 5\' 6"  (1.676 m), weight 253 lb (114.8 kg), SpO2 100 %. Body mass index is 40.84 kg/m. Physical Exam  Constitutional: She is oriented to person, place, and time. She appears well-developed and well-nourished.  Cardiovascular: Normal rate.  Pulmonary/Chest: Effort normal.  Musculoskeletal: Normal range of motion.  Neurological: She is oriented to person, place, and time.  Skin: Skin is warm and dry.  Psychiatric: She has a normal mood and affect. Her behavior is normal.  Vitals reviewed.   RECENT LABS AND TESTS: BMET    Component Value Date/Time   NA 139 09/24/2017 0921   K 4.1 09/24/2017 0921   CL 102 09/24/2017 0921   CO2 26 09/24/2017 0921   GLUCOSE 93 09/24/2017 0921   GLUCOSE 89 08/14/2017 0813   BUN 24 09/24/2017 0921   CREATININE 0.72 09/24/2017 0921   CREATININE 0.68 05/25/2015 1616   CALCIUM 9.9 09/24/2017 0921   GFRNONAA 93 09/24/2017 0921   GFRAA 107 09/24/2017 0921   Lab Results  Component Value Date   HGBA1C 6.0 (H) 09/24/2017   HGBA1C 6.1 08/14/2017   HGBA1C 6.1 06/30/2016   HGBA1C 6.1 03/09/2014   HGBA1C 6.3 12/04/2009   Lab Results  Component Value Date   INSULIN 10.3 09/24/2017   CBC    Component Value Date/Time   WBC 5.6 09/24/2017 0921   WBC 5.4 08/14/2017 0813   RBC 5.12 09/24/2017 0921   RBC 5.10 08/14/2017 0813   HGB 12.6 09/24/2017 0921   HCT 40.7 09/24/2017 0921   PLT 207 09/24/2017 0921   MCV 80 09/24/2017 0921   MCH 24.6 (L) 09/24/2017 0921   MCH 25.0 (L) 05/25/2015 1616   MCHC 31.0 (L) 09/24/2017 0921   MCHC 32.9  08/14/2017 0813   RDW 16.0 (H) 09/24/2017 0921   LYMPHSABS 2.8 09/24/2017 0921   MONOABS 0.4 08/14/2017 0813   EOSABS 0.1 09/24/2017 0921   BASOSABS 0.0 09/24/2017 0921   Iron/TIBC/Ferritin/ %Sat No results found for: IRON, TIBC, FERRITIN, IRONPCTSAT Lipid Panel     Component Value Date/Time   CHOL 215 (H) 09/24/2017  0921   TRIG 79 09/24/2017 0921   HDL 72 09/24/2017 0921   CHOLHDL 3.0 09/24/2017 0921   CHOLHDL 3 08/14/2017 0813   VLDL 13.2 08/14/2017 0813   LDLCALC 127 (H) 09/24/2017 0921   LDLDIRECT 128.0 01/03/2013 0829   Hepatic Function Panel     Component Value Date/Time   PROT 7.3 09/24/2017 0921   ALBUMIN 4.2 09/24/2017 0921   AST 17 09/24/2017 0921   ALT 14 09/24/2017 0921   ALKPHOS 61 09/24/2017 0921   BILITOT <0.2 09/24/2017 0921   BILIDIR 0.1 05/25/2015 1616   IBILI 0.3 05/25/2015 1616      Component Value Date/Time   TSH 1.530 09/24/2017 0921   TSH 1.59 08/14/2017 0813   TSH 1.053 05/25/2015 1616   Results for LEAN, JAEGER (MRN [id number]) as of 11/18/2017 15:52  Ref. Range 09/24/2017 09:21  Vitamin D, 25-Hydroxy Latest Ref Range: 30.0 - 100.0 ng/mL 34.1   ASSESSMENT AND PLAN: Vitamin D deficiency - Plan: Vitamin D, Ergocalciferol, (DRISDOL) 50000 units CAPS capsule  Essential hypertension  At risk for osteoporosis  Class 3 severe obesity with serious comorbidity and body mass index (BMI) of 40.0 to 44.9 in adult, unspecified obesity type (HCC)  PLAN:  Vitamin D Deficiency Shaconda was informed that low vitamin D levels contributes to fatigue and are associated with obesity, breast, and colon cancer. She agrees to continue to take prescription Vit D @50 ,000 IU every week #4 with no refills and will follow up for routine testing of vitamin D, at least 2-3 times per year. She was informed of the risk of over-replacement of vitamin D and agrees to not increase her dose unless she discusses this with [place] first. Melenie agrees to follow up as  directed.  At risk for osteopenia and osteoporosis Heather [name] is at risk for osteopenia and osteoporosis due to her vitamin D deficiency. She was encouraged to take her vitamin D and follow her higher calcium diet and increase strengthening exercise to help strengthen her bones and decrease her risk of osteopenia and osteoporosis.  Hypertension We discussed sodium restriction, working on healthy weight loss, and a regular exercise program as the means to achieve improved blood pressure control. Heather [name] agreed with this plan and agreed to follow up as directed. We will follow up at the next visit and will continue to monitor her blood pressure as well as her progress with the above lifestyle modifications. She will continue her medications as prescribed and will watch for signs of hypotension as she continues her lifestyle modifications.  Obesity Heather [name] is currently in the action stage of change. As such, her goal is to continue with weight loss efforts She has agreed to follow the Category 2 plan Heather [name] has been instructed to work up to a goal of 150 minutes of combined cardio and strengthening exercise per week for weight loss and overall health benefits. We discussed the following Behavioral Modification Strategies today: planning for success, better snacking choices, increasing lean protein intake, increasing vegetables and work on meal planning and easy cooking plans  [place] has agreed to follow up with our clinic in 2 weeks. She was informed of the importance of frequent follow up visits to maximize her success with intensive lifestyle modifications for her multiple health conditions.   OBESITY BEHAVIORAL INTERVENTION VISIT  Today's visit was # 3 out of 22.  Starting weight: 252 lbs Starting date: 09/24/17 Today's weight : 253 lbs Today's date: 11/17/2017 Total lbs lost to date: 0  ASK: We discussed the diagnosis of obesity with Glenice Bow today and Chiquetta  agreed to give [place] permission to discuss obesity behavioral modification therapy today.  ASSESS: Heather [name] has the diagnosis of obesity and her BMI today is 40.85 Heather [name] is in the action stage of change   ADVISE: Heather [name] was educated on the multiple health risks of obesity as well as the benefit of weight loss to improve her health. She was advised of the need for long term treatment and the importance of lifestyle modifications.  AGREE: Multiple dietary modification options and treatment options were discussed and  Heather [name] agreed to the above obesity treatment plan.  I, Doreene Nest, am acting as transcriptionist for Eber Jones, MD  I have reviewed the above documentation for accuracy and completeness, and I agree with the above. - Ilene Qua, MD

## 2017-11-24 ENCOUNTER — Ambulatory Visit: Payer: 59 | Admitting: Family Medicine

## 2017-11-26 ENCOUNTER — Telehealth: Payer: Self-pay | Admitting: Neurology

## 2017-11-26 DIAGNOSIS — I83813 Varicose veins of bilateral lower extremities with pain: Secondary | ICD-10-CM | POA: Diagnosis not present

## 2017-11-26 NOTE — Procedures (Signed)
NAME:   Heather [name]                                                               DOB: 11-Apr-1961 MEDICAL RECORD NUMBER [id number]                                                       DOS:  11/18/2017 REFERRING PHYSICIAN: [name], MD, Dennard Nip, MD STUDY PERFORMED: Home Sleep Study on apnea link  HISTORY: Heather [name] is a 57 y.o. female patient of Dr. Etter Sjogren- Cheri Rous and a new patient to our practice. She is a 57 year old right-handed African-American lady that just recently developed a sleep disturbance- sudden onset about 6 months ago.  One new problem is that she has left arm and shoulder pain and that resting on her left has been uncomfortable but this is her preferred sleep position.  She snores, confirmed by her mother. She has been excessively daytime sleepy-falling asleep on the sofa, while reading a book, or in a movie theater or waiting room. But it doesn't translate into a short sleep latency at night when she struggles to sleep.  In her bedroom it may take 1 hour. PS; She had similar degree of sleepiness 26 years ago and the hypersomnia resolved. She has gained weight.  She has made arrangements to see Dr. Dennard Nip for medical weight management.  It was recommended that she would also be evaluated for sleep apnea.  Epworth score 16, Fatigue severity score 47, BMI: 40.51  STUDY RESULTS:  Total valid Recording Time: 7 hours 1 minute Total Apnea/Hypopnea Index (AHI): 2.3 /h; RDI: 6.0 /h Average Oxygen Saturation:  93 %   Lowest Oxygen Saturation:  85%  Total Time Oxygen Saturation below 89 %: 2.0 minutes  Average Heart Rate:  68 bpm (between 51 and 111 bpm) IMPRESSION: This HST did not find OSA to be present, but the RDI indicates snoring is present.  There was no irregular heart rate, neither prolonged hypoxia.   RECOMMENDATION: Snoring alone can be treated with a dental device, and will likely respond to weight loss. Not a CPAP indication. Allocate enough time to  sleep- bedtime to advance by 30 minutes, implement sleep hygiene.  The test result cannot explain the described degree of hypersomnia and a narcolepsy work up may be indicated.  I certify that I have reviewed the raw data recording prior to the issuance of this report in accordance with the standards of the American Academy of Sleep Medicine (AASM). Larey Seat, M.D.     11-26-2017   Medical Director of [facility] at [facility], accredited by the AASM. Diplomat of the ABPN and ABSM.

## 2017-11-26 NOTE — Telephone Encounter (Signed)
Pt returned call and I was able to discuss the sleep study results with the pt. Informed her that no apnea, irregular heart rhythm or oxygen issues were found on the HST. The patient did have snoring that was present in which Dr Dohmeier recommends that if she wished to treat she could do so with a dental device or simply with weight loss. Informed the patient of this option. Pt verbalized understanding. Pt had no questions at this time but was encouraged to call back if questions arise.

## 2017-11-26 NOTE — Telephone Encounter (Signed)
-----   Message from Larey Seat, MD sent at 11/26/2017  9:36 AM EDT ----- IMPRESSION: This HST did not find OSA to be present, but the RDI  indicates snoring is present.  There was no irregular heart rate, neither prolonged hypoxia.  RECOMMENDATION: Snoring alone can be treated with a dental  device, and will likely respond to weight loss. Not a CPAP  indication. Allocate enough time to sleep- bedtime to advance by 30 minutes,  implement sleep hygiene.  The test result cannot explain the described degree of  hypersomnia and a narcolepsy work up may be indicated.

## 2017-11-26 NOTE — Telephone Encounter (Signed)
Called patient to discuss sleep study results. No answer at this time. LVM for the patient to call back.   

## 2017-12-07 ENCOUNTER — Ambulatory Visit (INDEPENDENT_AMBULATORY_CARE_PROVIDER_SITE_OTHER): Payer: 59 | Admitting: Family Medicine

## 2017-12-07 VITALS — BP 138/81 | HR 70 | Temp 98.2°F | Ht 66.0 in | Wt 257.0 lb

## 2017-12-07 DIAGNOSIS — Z6841 Body Mass Index (BMI) 40.0 and over, adult: Secondary | ICD-10-CM

## 2017-12-07 DIAGNOSIS — F3289 Other specified depressive episodes: Secondary | ICD-10-CM

## 2017-12-07 DIAGNOSIS — Z9189 Other specified personal risk factors, not elsewhere classified: Secondary | ICD-10-CM | POA: Diagnosis not present

## 2017-12-07 MED ORDER — BUPROPION HCL ER (SR) 150 MG PO TB12: 150.0000 mg | ORAL_TABLET | Freq: Every day | ORAL | 0 refills | Status: DC

## 2017-12-07 NOTE — Progress Notes (Signed)
Office: [phone]  /  Fax: [phone]   HPI:   Chief Complaint: OBESITY Heather [name] is here to discuss her progress with her obesity treatment plan. She is on the Category 2 plan and is following her eating plan approximately 60 % of the time. She states she is exercising 0 minutes 0 times per week. Rivka has been off track with increased night time eating to help stay awake. She sleeps 5 hours per night on an average and snacks on simple carbohydrates.  Her weight is 257 lb (116.6 kg) today and has gained 4 pounds since her last visit. She has lost 0 lbs since starting treatment with [place].  Depression with emotional eating behaviors Heather [name] notes increased comfort eating especially in the evening, as she snacks to stay awake while working late at night. she is frustrated as she seems to not have good control. Heather [name] struggles with emotional eating and using food for comfort to the extent that it is negatively impacting her health. She often snacks when she is not hungry. Heather [name] sometimes feels she is out of control and then feels guilty that she made poor food choices. She has been working on behavior modification techniques to help reduce her emotional eating and has been somewhat successful. She shows no sign of suicidal or homicidal ideations.  Depression screen Heather [name] [facility] 2/9 09/24/2017 08/24/2017 06/26/2016 03/27/2015 01/07/2013  Decreased Interest 1 0 0 0 0  Down, Depressed, Hopeless 1 0 0 0 0  PHQ - 2 Score 2 0 0 0 0  Altered sleeping 1 - - - -  Tired, decreased energy 1 - - - -  Change in appetite 2 - - - -  Feeling bad or failure about yourself  1 - - - -  Trouble concentrating 1 - - - -  Moving slowly or fidgety/restless 1 - - - -  Suicidal thoughts 0 - - - -  PHQ-9 Score 9 - - - -  Difficult doing work/chores Somewhat difficult - - - -    At risk for cardiovascular disease Mendy is at a higher than average risk for cardiovascular disease due to obesity. She currently  denies any chest pain.  ALLERGIES: Allergies  Allergen Reactions  . Oseltamivir Phosphate     MEDICATIONS: Current Outpatient Medications on File Prior to Visit  Medication Sig Dispense Refill  . fluticasone (FLONASE) 50 MCG/ACT nasal spray Place 2 sprays into both nostrils daily. 48 g 1  . furosemide (LASIX) 20 MG tablet TAKE 1 TABLET BY MOUTH ONCE DAILY 90 tablet 0  . Lactobacillus (PROBIOTIC ACIDOPHILUS PO) Take by mouth.    . latanoprost (XALATAN) 0.005 % ophthalmic solution     . levocetirizine (XYZAL) 5 MG tablet Take 1 tablet (5 mg total) by mouth every evening. 30 tablet 3  . lisinopril (PRINIVIL,ZESTRIL) 10 MG tablet Take 1 tablet (10 mg total) by mouth daily. 90 tablet 3  . meloxicam (MOBIC) 15 MG tablet 1/2-1 po qd prn pain 30 tablet 0  . Multiple Vitamin (MULTIVITAMIN) tablet Take 1 tablet by mouth daily.      . pravastatin (PRAVACHOL) 40 MG tablet TAKE 1 TABLET BY MOUTH ONCE DAILY 90 tablet 1  . Vitamin D, Ergocalciferol, (DRISDOL) 50000 units CAPS capsule Take 1 capsule (50,000 Units total) by mouth every 7 (seven) days. 4 capsule 0   No current facility-administered medications on file prior to visit.     PAST MEDICAL HISTORY: Past Medical History:  Diagnosis Date  . Acute bronchitis   .  Allergic rhinitis   . Allergy   . Anemia   . Anxiety   . Arthritis    knees  . Back pain   . Glaucoma   . Hyperlipidemia   . Hypertension   . Joint pain   . Leg edema   . Prediabetes     PAST SURGICAL HISTORY: Past Surgical History:  Procedure Laterality Date  . ABDOMINAL HYSTERECTOMY  1998  . BREAST LUMPECTOMY    . CESAREAN SECTION    . MYOMECTOMY      SOCIAL HISTORY: Social History   Tobacco Use  . Smoking status: Never Smoker  . Smokeless tobacco: Never Used  Substance Use Topics  . Alcohol use: Yes    Alcohol/week: 0.0 standard drinks    Comment: occ  . Drug use: No    FAMILY HISTORY: Family History  Problem Relation Age of Onset  . Dementia  Father   . Stroke Father   . Cancer Father 34       prostate  . Atrial fibrillation Father   . Alzheimer's disease Father   . Hyperlipidemia Father   . Hypertension Father   . Heart disease Father   . Hyperlipidemia Brother   . Hypertension Brother   . Hyperlipidemia Brother   . Hypertension Brother   . Hypertension Brother   . Pulmonary embolism Brother   . Cancer - Colon Brother 60       colon CA--stage 4  . Colon cancer Brother   . Colon cancer Cousin 66  . Hypertension Mother   . Hyperlipidemia Mother   . Heart disease Mother   . Endometrial cancer Unknown   . Atrial fibrillation Unknown   . Diabetes Maternal Aunt   . Diabetes Maternal Uncle   . Kidney disease Maternal Uncle        dialysis  . Cancer Maternal Grandmother        uterine  . Cancer Maternal Grandfather   . Cancer Paternal Grandfather        pancreatic    ROS: Review of Systems  Constitutional: Negative for weight loss.  Cardiovascular: Negative for chest pain.  Psychiatric/Behavioral: Positive for depression. Negative for suicidal ideas.    PHYSICAL EXAM: Blood pressure 138/81, pulse 70, temperature 98.2 F (36.8 C), temperature source Oral, height 5\' 6"  (1.676 m), weight 257 lb (116.6 kg), SpO2 99 %. Body mass index is 41.48 kg/m. Physical Exam  Constitutional: She is oriented to person, place, and time. She appears well-developed and well-nourished.  Cardiovascular: Normal rate.  Pulmonary/Chest: Effort normal.  Musculoskeletal: Normal range of motion.  Neurological: She is oriented to person, place, and time.  Skin: Skin is warm and dry.  Psychiatric: She has a normal mood and affect. Her behavior is normal.  Vitals reviewed.   RECENT LABS AND TESTS: BMET    Component Value Date/Time   NA 139 09/24/2017 0921   K 4.1 09/24/2017 0921   CL 102 09/24/2017 0921   CO2 26 09/24/2017 0921   GLUCOSE 93 09/24/2017 0921   GLUCOSE 89 08/14/2017 0813   BUN 24 09/24/2017 0921   CREATININE  0.72 09/24/2017 0921   CREATININE 0.68 05/25/2015 1616   CALCIUM 9.9 09/24/2017 0921   GFRNONAA 93 09/24/2017 0921   GFRAA 107 09/24/2017 0921   Lab Results  Component Value Date   HGBA1C 6.0 (H) 09/24/2017   HGBA1C 6.1 08/14/2017   HGBA1C 6.1 06/30/2016   HGBA1C 6.1 03/09/2014   HGBA1C 6.3 12/04/2009   Lab Results  Component Value Date   INSULIN 10.3 09/24/2017   CBC    Component Value Date/Time   WBC 5.6 09/24/2017 0921   WBC 5.4 08/14/2017 0813   RBC 5.12 09/24/2017 0921   RBC 5.10 08/14/2017 0813   HGB 12.6 09/24/2017 0921   HCT 40.7 09/24/2017 0921   PLT 207 09/24/2017 0921   MCV 80 09/24/2017 0921   MCH 24.6 (L) 09/24/2017 0921   MCH 25.0 (L) 05/25/2015 1616   MCHC 31.0 (L) 09/24/2017 0921   MCHC 32.9 08/14/2017 0813   RDW 16.0 (H) 09/24/2017 0921   LYMPHSABS 2.8 09/24/2017 0921   MONOABS 0.4 08/14/2017 0813   EOSABS 0.1 09/24/2017 0921   BASOSABS 0.0 09/24/2017 0921   Iron/TIBC/Ferritin/ %Sat No results found for: IRON, TIBC, FERRITIN, IRONPCTSAT Lipid Panel     Component Value Date/Time   CHOL 215 (H) 09/24/2017 0921   TRIG 79 09/24/2017 0921   HDL 72 09/24/2017 0921   CHOLHDL 3.0 09/24/2017 0921   CHOLHDL 3 08/14/2017 0813   VLDL 13.2 08/14/2017 0813   LDLCALC 127 (H) 09/24/2017 0921   LDLDIRECT 128.0 01/03/2013 0829   Hepatic Function Panel     Component Value Date/Time   PROT 7.3 09/24/2017 0921   ALBUMIN 4.2 09/24/2017 0921   AST 17 09/24/2017 0921   ALT 14 09/24/2017 0921   ALKPHOS 61 09/24/2017 0921   BILITOT <0.2 09/24/2017 0921   BILIDIR 0.1 05/25/2015 1616   IBILI 0.3 05/25/2015 1616      Component Value Date/Time   TSH 1.530 09/24/2017 0921   TSH 1.59 08/14/2017 0813   TSH 1.053 05/25/2015 1616    ASSESSMENT AND PLAN: Other depression - Plan: buPROPion (WELLBUTRIN SR) 150 MG 12 hr tablet  At risk for heart disease  Class 3 severe obesity with serious comorbidity and body mass index (BMI) of 40.0 to 44.9 in adult,  unspecified obesity type (HCC)  PLAN:  Depression with Emotional Eating Behaviors We discussed behavior modification techniques today to help Heather [name] deal with her emotional eating and depression. Heather [name] agrees to start Wellbutrin SR 150 mg q AM #30 with no refills. Heather [name] agrees to follow up with our clinic in 2 weeks.  Cardiovascular risk counselling Heather [name] was given extended (15 minutes) coronary artery disease prevention counseling today. She is 57 y.o. female and has risk factors for heart disease including obesity. We discussed intensive lifestyle modifications today with an emphasis on specific weight loss instructions and strategies. Pt was also informed of the importance of increasing exercise and decreasing saturated fats to help prevent heart disease.  Obesity Heather [name] is currently in the action stage of change. As such, her goal is to continue with weight loss efforts She has agreed to follow the Category 2 plan Heather [name] has been instructed to work up to a goal of 150 minutes of combined cardio and strengthening exercise per week for weight loss and overall health benefits. We discussed the following Behavioral Modification Strategies today: decreasing simple carbohydrates, emotional eating strategies, and better snacking choices   Heather [name] has agreed to follow up with our clinic in 2 weeks. She was informed of the importance of frequent follow up visits to maximize her success with intensive lifestyle modifications for her multiple health conditions.   OBESITY BEHAVIORAL INTERVENTION VISIT  Today's visit was # 4 out of 22.  Starting weight: 252 lbs Starting date: 09/24/17 Today's weight : 257 lbs  Today's date: 12/07/2017 Total lbs lost to date: 0    ASK: We  discussed the diagnosis of obesity with Heather [name] today and Heather [name] agreed to give [place] permission to discuss obesity behavioral modification therapy today.  ASSESS: Lerin has the  diagnosis of obesity and her BMI today is 41.5 Heather [name] is in the action stage of change   ADVISE: Heather [name] was educated on the multiple health risks of obesity as well as the benefit of weight loss to improve her health. She was advised of the need for long term treatment and the importance of lifestyle modifications.  AGREE: Multiple dietary modification options and treatment options were discussed and  Heather [name] agreed to the above obesity treatment plan.  I, Trixie Dredge, am acting as transcriptionist for Dennard Nip, MD  I have reviewed the above documentation for accuracy and completeness, and I agree with the above. -Dennard Nip, MD

## 2017-12-21 ENCOUNTER — Encounter (INDEPENDENT_AMBULATORY_CARE_PROVIDER_SITE_OTHER): Payer: Self-pay | Admitting: Family Medicine

## 2017-12-22 ENCOUNTER — Encounter (INDEPENDENT_AMBULATORY_CARE_PROVIDER_SITE_OTHER): Payer: Self-pay

## 2017-12-22 ENCOUNTER — Ambulatory Visit (INDEPENDENT_AMBULATORY_CARE_PROVIDER_SITE_OTHER): Payer: 59 | Admitting: Bariatrics

## 2018-01-05 ENCOUNTER — Other Ambulatory Visit (INDEPENDENT_AMBULATORY_CARE_PROVIDER_SITE_OTHER): Payer: Self-pay | Admitting: Family Medicine

## 2018-01-05 DIAGNOSIS — F3289 Other specified depressive episodes: Secondary | ICD-10-CM

## 2018-01-06 DIAGNOSIS — I83812 Varicose veins of left lower extremities with pain: Secondary | ICD-10-CM | POA: Diagnosis not present

## 2018-01-06 DIAGNOSIS — I83811 Varicose veins of right lower extremities with pain: Secondary | ICD-10-CM | POA: Diagnosis not present

## 2018-01-06 DIAGNOSIS — I8312 Varicose veins of left lower extremity with inflammation: Secondary | ICD-10-CM | POA: Diagnosis not present

## 2018-01-06 DIAGNOSIS — I8311 Varicose veins of right lower extremity with inflammation: Secondary | ICD-10-CM | POA: Diagnosis not present

## 2018-01-08 DIAGNOSIS — I8312 Varicose veins of left lower extremity with inflammation: Secondary | ICD-10-CM | POA: Diagnosis not present

## 2018-01-08 DIAGNOSIS — I83812 Varicose veins of left lower extremities with pain: Secondary | ICD-10-CM | POA: Diagnosis not present

## 2018-01-11 DIAGNOSIS — I8311 Varicose veins of right lower extremity with inflammation: Secondary | ICD-10-CM | POA: Diagnosis not present

## 2018-01-11 DIAGNOSIS — I83811 Varicose veins of right lower extremities with pain: Secondary | ICD-10-CM | POA: Diagnosis not present

## 2018-01-13 DIAGNOSIS — I8311 Varicose veins of right lower extremity with inflammation: Secondary | ICD-10-CM | POA: Diagnosis not present

## 2018-01-22 DIAGNOSIS — I8312 Varicose veins of left lower extremity with inflammation: Secondary | ICD-10-CM | POA: Diagnosis not present

## 2018-01-25 DIAGNOSIS — H401131 Primary open-angle glaucoma, bilateral, mild stage: Secondary | ICD-10-CM | POA: Diagnosis not present

## 2018-01-28 ENCOUNTER — Other Ambulatory Visit: Payer: Self-pay | Admitting: Family Medicine

## 2018-02-10 ENCOUNTER — Other Ambulatory Visit: Payer: Self-pay | Admitting: Gastroenterology

## 2018-02-10 DIAGNOSIS — Z800 Family history of malignant neoplasm of digestive organs: Secondary | ICD-10-CM

## 2018-02-12 DIAGNOSIS — I8312 Varicose veins of left lower extremity with inflammation: Secondary | ICD-10-CM | POA: Diagnosis not present

## 2018-02-23 ENCOUNTER — Ambulatory Visit: Payer: 59 | Admitting: Family Medicine

## 2018-02-23 ENCOUNTER — Encounter: Payer: Self-pay | Admitting: Family Medicine

## 2018-02-23 VITALS — BP 116/78 | HR 74 | Temp 98.0°F | Resp 16 | Ht 66.0 in | Wt 267.0 lb

## 2018-02-23 DIAGNOSIS — I10 Essential (primary) hypertension: Secondary | ICD-10-CM

## 2018-02-23 DIAGNOSIS — T7840XD Allergy, unspecified, subsequent encounter: Secondary | ICD-10-CM

## 2018-02-23 DIAGNOSIS — E785 Hyperlipidemia, unspecified: Secondary | ICD-10-CM

## 2018-02-23 DIAGNOSIS — Z23 Encounter for immunization: Secondary | ICD-10-CM

## 2018-02-23 DIAGNOSIS — E1139 Type 2 diabetes mellitus with other diabetic ophthalmic complication: Secondary | ICD-10-CM

## 2018-02-23 DIAGNOSIS — M62830 Muscle spasm of back: Secondary | ICD-10-CM

## 2018-02-23 MED ORDER — LEVOCETIRIZINE DIHYDROCHLORIDE 5 MG PO TABS: 5.0000 mg | ORAL_TABLET | Freq: Every evening | ORAL | 1 refills | Status: DC

## 2018-02-23 MED ORDER — CYCLOBENZAPRINE HCL 10 MG PO TABS: 10.0000 mg | ORAL_TABLET | Freq: Three times a day (TID) | ORAL | 0 refills | Status: DC | PRN

## 2018-02-23 NOTE — Progress Notes (Signed)
Patient ID: Heather [name], female    DOB: 12/08/1960  Age: 57 y.o. MRN: [id number]    Subjective:  Subjective  HPI Heather [name] presents for bp, chol and dm.  She also c/o low back spasms when she has to sit a lot at work.  She took her friends flexeril and it helped a lot.   No other complaints.   HYPERTENSION   Blood pressure range-not checking   Chest pain- no      Dyspnea- no Lightheadedness- no   Edema- no  Other side effects - no   Medication compliance: good Low salt diet- yes    DIABETES    Blood Sugar ranges-good per pt  Polyuria- no New Visual problems- no  Hypoglycemic symptoms- no  Other side effects-no Medication compliance - good Last eye exam- recent Foot exam- today   HYPERLIPIDEMIA  Medication compliance- good RUQ pain- no  Muscle aches- no Other side effects-no       Review of Systems  Constitutional: Negative for chills and fever.  HENT: Negative for congestion and hearing loss.   Eyes: Negative for discharge.  Respiratory: Negative for cough and shortness of breath.   Cardiovascular: Negative for chest pain, palpitations and leg swelling.  Gastrointestinal: Negative for abdominal pain, blood in stool, constipation, diarrhea, nausea and vomiting.  Genitourinary: Negative for dysuria, frequency, hematuria and urgency.  Musculoskeletal: Positive for back pain. Negative for myalgias.  Skin: Negative for rash.  Allergic/Immunologic: Negative for environmental allergies.  Neurological: Negative for dizziness, weakness and headaches.  Hematological: Does not bruise/bleed easily.  Psychiatric/Behavioral: Negative for suicidal ideas. The patient is not nervous/anxious.     History Past Medical History:  Diagnosis Date  . Acute bronchitis   . Allergic rhinitis   . Allergy   . Anemia   . Anxiety   . Arthritis    knees  . Back pain   . Glaucoma   . Hyperlipidemia   . Hypertension   . Joint pain   . Leg edema   . Prediabetes      She has a past surgical history that includes Abdominal hysterectomy (1998); Breast lumpectomy; Cesarean section; and Myomectomy.   Her family history includes Alzheimer's disease in her father; Atrial fibrillation in her father and unknown relative; Cancer in her maternal grandfather, maternal grandmother, and paternal grandfather; Cancer (age of onset: 71) in her father; Cancer - Colon (age of onset: 30) in her brother; Colon cancer in her brother; Colon cancer (age of onset: 79) in her cousin; Dementia in her father; Diabetes in her maternal aunt and maternal uncle; Endometrial cancer in her unknown relative; Heart disease in her father and mother; Hyperlipidemia in her brother, brother, father, and mother; Hypertension in her brother, brother, brother, father, and mother; Kidney disease in her maternal uncle; Pulmonary embolism in her brother; Stroke in her father.She reports that she has never smoked. She has never used smokeless tobacco. She reports that she drinks alcohol. She reports that she does not use drugs.  Current Outpatient Medications on File Prior to Visit  Medication Sig Dispense Refill  . buPROPion (WELLBUTRIN SR) 150 MG 12 hr tablet Take 1 tablet (150 mg total) by mouth daily. 30 tablet 0  . fluticasone (FLONASE) 50 MCG/ACT nasal spray Place 2 sprays into both nostrils daily. 48 g 1  . furosemide (LASIX) 20 MG tablet TAKE 1 TABLET BY MOUTH ONCE DAILY 90 tablet 0  . Lactobacillus (PROBIOTIC ACIDOPHILUS PO) Take by mouth.    Marland Kitchen  latanoprost (XALATAN) 0.005 % ophthalmic solution     . lisinopril (PRINIVIL,ZESTRIL) 10 MG tablet Take 1 tablet (10 mg total) by mouth daily. 90 tablet 3  . meloxicam (MOBIC) 15 MG tablet 1/2-1 po qd prn pain 30 tablet 0  . Multiple Vitamin (MULTIVITAMIN) tablet Take 1 tablet by mouth daily.      . pravastatin (PRAVACHOL) 40 MG tablet TAKE 1 TABLET BY MOUTH ONCE DAILY 90 tablet 1  . Vitamin D, Ergocalciferol, (DRISDOL) 50000 units CAPS capsule Take 1  capsule (50,000 Units total) by mouth every 7 (seven) days. 4 capsule 0   No current facility-administered medications on file prior to visit.      Objective:  Objective  Physical Exam  Constitutional: She is oriented to person, place, and time. She appears well-developed and well-nourished.  HENT:  Head: Normocephalic and atraumatic.  Eyes: Conjunctivae and EOM are normal.  Neck: Normal range of motion. Neck supple. No JVD present. Carotid bruit is not present. No thyromegaly present.  Cardiovascular: Normal rate, regular rhythm and normal heart sounds.  No murmur heard. Pulmonary/Chest: Effort normal and breath sounds normal. No respiratory distress. She has no wheezes. She has no rales. She exhibits no tenderness.  Musculoskeletal: Normal range of motion. She exhibits no edema or tenderness.  Neurological: She is alert and oriented to person, place, and time. She displays normal reflexes. She exhibits normal muscle tone.  Psychiatric: She has a normal mood and affect.  Nursing note and vitals reviewed.  BP 116/78 (BP Location: Right Arm, Cuff Size: Large)   Pulse 74   Temp 98 F (36.7 C) (Oral)   Resp 16   Ht 5\' 6"  (1.676 m)   Wt 267 lb (121.1 kg)   SpO2 98%   BMI 43.09 kg/m  Wt Readings from Last 3 Encounters:  02/23/18 267 lb (121.1 kg)  12/07/17 257 lb (116.6 kg)  11/17/17 253 lb (114.8 kg)     Lab Results  Component Value Date   WBC 5.6 09/24/2017   HGB 12.6 09/24/2017   HCT 40.7 09/24/2017   PLT 207 09/24/2017   GLUCOSE 93 09/24/2017   CHOL 215 (H) 09/24/2017   TRIG 79 09/24/2017   HDL 72 09/24/2017   LDLDIRECT 128.0 01/03/2013   LDLCALC 127 (H) 09/24/2017   ALT 14 09/24/2017   AST 17 09/24/2017   NA 139 09/24/2017   K 4.1 09/24/2017   CL 102 09/24/2017   CREATININE 0.72 09/24/2017   BUN 24 09/24/2017   CO2 26 09/24/2017   TSH 1.530 09/24/2017   HGBA1C 6.0 (H) 09/24/2017   MICROALBUR 0.2 01/03/2013    Dg Chest 2 View  Result Date:  05/30/2016 CLINICAL DATA:  Cough, fever and shortness of breath EXAM: CHEST  2 VIEW COMPARISON:  Chest radiograph 06/30/2011 FINDINGS: The lungs are well inflated. Cardiomediastinal contours are normal. No pneumothorax or pleural effusion. No focal airspace consolidation or pulmonary edema. IMPRESSION: No active cardiopulmonary disease. Electronically Signed   By: Ulyses Jarred M.D.   On: 05/30/2016 15:48     Assessment & Plan:  Plan  I am having Heather [name] start on cyclobenzaprine. I am also having her maintain her multivitamin, Lactobacillus (PROBIOTIC ACIDOPHILUS PO), latanoprost, lisinopril, meloxicam, pravastatin, fluticasone, Vitamin D (Ergocalciferol), buPROPion, furosemide, and levocetirizine.  Meds ordered this encounter  Medications  . levocetirizine (XYZAL) 5 MG tablet    Sig: Take 1 tablet (5 mg total) by mouth every evening.    Dispense:  90 tablet  Refill:  1  . cyclobenzaprine (FLEXERIL) 10 MG tablet    Sig: Take 1 tablet (10 mg total) by mouth 3 (three) times daily as needed for muscle spasms.    Dispense:  30 tablet    Refill:  0    Problem List Items Addressed This Visit      Unprioritized   Allergies - Primary    Stable       Relevant Medications   levocetirizine (XYZAL) 5 MG tablet   Diabetes mellitus ([place])    hgba1c to be checked , minimize simple carbs. Increase exercise as tolerated. Continue current meds       Relevant Orders   Lipid panel   Comprehensive metabolic panel   Hemoglobin A1c   Essential hypertension    Well controlled, no changes to meds. Encouraged heart healthy diet such as the DASH diet and exercise as tolerated.       Relevant Orders   Lipid panel   Comprehensive metabolic panel   Hemoglobin A1c   Hyperlipidemia    Tolerating statin, encouraged heart healthy diet, avoid trans fats, minimize simple carbs and saturated fats. Increase exercise as tolerated      Relevant Orders   Lipid panel   Comprehensive metabolic  panel   Spasm of muscle of lower back   Relevant Medications   cyclobenzaprine (FLEXERIL) 10 MG tablet    Other Visit Diagnoses    Need for shingles vaccine       Relevant Orders   Varicella-zoster vaccine IM (Shingrix) (Completed)   Influenza vaccine administered       Relevant Orders   Flu Vaccine QUAD 6+ mos PF IM (Fluarix Quad PF) (Completed)      Follow-up: Return in about 2 months (around 04/25/2018), or labs and shingrix #2 .  Ann Held, DO

## 2018-02-23 NOTE — Patient Instructions (Signed)

## 2018-02-24 DIAGNOSIS — M62830 Muscle spasm of back: Secondary | ICD-10-CM | POA: Insufficient documentation

## 2018-02-24 DIAGNOSIS — E119 Type 2 diabetes mellitus without complications: Secondary | ICD-10-CM | POA: Insufficient documentation

## 2018-02-24 DIAGNOSIS — T7840XA Allergy, unspecified, initial encounter: Secondary | ICD-10-CM | POA: Insufficient documentation

## 2018-02-24 NOTE — Assessment & Plan Note (Signed)
Tolerating statin, encouraged heart healthy diet, avoid trans fats, minimize simple carbs and saturated fats. Increase exercise as tolerated 

## 2018-02-24 NOTE — Assessment & Plan Note (Signed)
hgba1c to be checked, minimize simple carbs. Increase exercise as tolerated. Continue current meds  

## 2018-02-24 NOTE — Assessment & Plan Note (Signed)
Stable

## 2018-02-24 NOTE — Assessment & Plan Note (Signed)
Well controlled, no changes to meds. Encouraged heart healthy diet such as the DASH diet and exercise as tolerated.  °

## 2018-03-01 DIAGNOSIS — I8312 Varicose veins of left lower extremity with inflammation: Secondary | ICD-10-CM | POA: Diagnosis not present

## 2018-03-01 DIAGNOSIS — I83812 Varicose veins of left lower extremities with pain: Secondary | ICD-10-CM | POA: Diagnosis not present

## 2018-03-22 ENCOUNTER — Other Ambulatory Visit: Payer: Self-pay | Admitting: Family Medicine

## 2018-03-22 DIAGNOSIS — M62830 Muscle spasm of back: Secondary | ICD-10-CM

## 2018-03-24 DIAGNOSIS — M7981 Nontraumatic hematoma of soft tissue: Secondary | ICD-10-CM | POA: Diagnosis not present

## 2018-03-24 DIAGNOSIS — I83811 Varicose veins of right lower extremities with pain: Secondary | ICD-10-CM | POA: Diagnosis not present

## 2018-03-24 DIAGNOSIS — I8311 Varicose veins of right lower extremity with inflammation: Secondary | ICD-10-CM | POA: Diagnosis not present

## 2018-04-06 DIAGNOSIS — I83811 Varicose veins of right lower extremities with pain: Secondary | ICD-10-CM | POA: Diagnosis not present

## 2018-04-06 DIAGNOSIS — I8311 Varicose veins of right lower extremity with inflammation: Secondary | ICD-10-CM | POA: Diagnosis not present

## 2018-04-19 DIAGNOSIS — I83811 Varicose veins of right lower extremities with pain: Secondary | ICD-10-CM | POA: Diagnosis not present

## 2018-04-19 DIAGNOSIS — I8311 Varicose veins of right lower extremity with inflammation: Secondary | ICD-10-CM | POA: Diagnosis not present

## 2018-04-28 ENCOUNTER — Ambulatory Visit (INDEPENDENT_AMBULATORY_CARE_PROVIDER_SITE_OTHER): Payer: 59

## 2018-04-28 ENCOUNTER — Other Ambulatory Visit (INDEPENDENT_AMBULATORY_CARE_PROVIDER_SITE_OTHER): Payer: 59

## 2018-04-28 DIAGNOSIS — E785 Hyperlipidemia, unspecified: Secondary | ICD-10-CM

## 2018-04-28 DIAGNOSIS — Z23 Encounter for immunization: Secondary | ICD-10-CM | POA: Diagnosis not present

## 2018-04-28 DIAGNOSIS — I10 Essential (primary) hypertension: Secondary | ICD-10-CM

## 2018-04-28 DIAGNOSIS — E1139 Type 2 diabetes mellitus with other diabetic ophthalmic complication: Secondary | ICD-10-CM

## 2018-04-28 LAB — LIPID PANEL
Cholesterol: 204 mg/dL — ABNORMAL HIGH (ref 0–200)
HDL: 59.9 mg/dL (ref >39.00)
LDL Cholesterol: 127 mg/dL — ABNORMAL HIGH (ref 0–99)
NonHDL: 144.5
Total CHOL/HDL Ratio: 3
Triglycerides: 86 mg/dL (ref 0.0–149.0)
VLDL: 17.2 mg/dL (ref 0.0–40.0)

## 2018-04-28 LAB — COMPREHENSIVE METABOLIC PANEL
ALT: 10 U/L (ref 0–35)
AST: 12 U/L (ref 0–37)
Albumin: 4.2 g/dL (ref 3.5–5.2)
Alkaline Phosphatase: 51 U/L (ref 39–117)
BUN: 18 mg/dL (ref 6–23)
CO2: 32 mEq/L (ref 19–32)
Calcium: 9.5 mg/dL (ref 8.4–10.5)
Chloride: 100 mEq/L (ref 96–112)
Creatinine, Ser: 0.72 mg/dL (ref 0.40–1.20)
GFR: 106.7 mL/min (ref >60.00)
GLUCOSE: 89 mg/dL (ref 70–99)
Potassium: 3.8 mEq/L (ref 3.5–5.1)
Sodium: 138 mEq/L (ref 135–145)
Total Bilirubin: 0.5 mg/dL (ref 0.2–1.2)
Total Protein: 7.1 g/dL (ref 6.0–8.3)

## 2018-04-28 LAB — HEMOGLOBIN A1C: Hgb A1c MFr Bld: 6.1 % (ref 4.6–6.5)

## 2018-04-29 ENCOUNTER — Ambulatory Visit: Payer: 59 | Admitting: Adult Health

## 2018-04-29 ENCOUNTER — Encounter: Payer: Self-pay | Admitting: Adult Health

## 2018-04-29 VITALS — BP 120/74 | Temp 98.5°F | Wt 274.0 lb

## 2018-04-29 DIAGNOSIS — R51 Headache: Secondary | ICD-10-CM

## 2018-04-29 DIAGNOSIS — M7981 Nontraumatic hematoma of soft tissue: Secondary | ICD-10-CM | POA: Diagnosis not present

## 2018-04-29 DIAGNOSIS — T50Z95A Adverse effect of other vaccines and biological substances, initial encounter: Secondary | ICD-10-CM | POA: Diagnosis not present

## 2018-04-29 DIAGNOSIS — R5383 Other fatigue: Secondary | ICD-10-CM

## 2018-04-29 NOTE — Progress Notes (Signed)
Subjective:    Patient ID: Heather [name], female    DOB: 05-25-1960, 58 y.o.   MRN: [id number]  HPI 58 year old female who  has a past medical history of Acute bronchitis, Allergic rhinitis, Allergy, Anemia, Anxiety, Arthritis, Back pain, Glaucoma, Hyperlipidemia, Hypertension, Joint pain, Leg edema, and Prediabetes.  She presents to the office today for concern of adverse reaction to shingles vaccination. She reports that she had the second shingles vaccination yesterday morning, by the afternoon she reports having chills and shaking that lasted until about 3 am this morning and then resolved. Currently she feels as slight headache and generalized fatigue.   No CP, SOB, fevers, or palpitations    Review of Systems See HPI   Past Medical History:  Diagnosis Date  . Acute bronchitis   . Allergic rhinitis   . Allergy   . Anemia   . Anxiety   . Arthritis    knees  . Back pain   . Glaucoma   . Hyperlipidemia   . Hypertension   . Joint pain   . Leg edema   . Prediabetes     Social History   Socioeconomic History  . Marital status: Single    Spouse name: Not on file  . Number of children: Not on file  . Years of education: 17  . Highest education level: Not on file  Occupational History  . Occupation: Counsellor lab  Social Needs  . Financial resource strain: Not on file  . Food insecurity:    Worry: Not on file    Inability: Not on file  . Transportation needs:    Medical: Not on file    Non-medical: Not on file  Tobacco Use  . Smoking status: Never Smoker  . Smokeless tobacco: Never Used  Substance and Sexual Activity  . Alcohol use: Yes    Alcohol/week: 0.0 standard drinks    Comment: occ  . Drug use: No  . Sexual activity: Yes  Lifestyle  . Physical activity:    Days per week: Not on file    Minutes per session: Not on file  . Stress: Not on file  Relationships  . Social connections:    Talks on phone: Not on file    Gets  together: Not on file    Attends religious service: Not on file    Active member of club or organization: Not on file    Attends meetings of clubs or organizations: Not on file    Relationship status: Not on file  . Intimate partner violence:    Fear of current or ex partner: Not on file    Emotionally abused: Not on file    Physically abused: Not on file    Forced sexual activity: Not on file  Other Topics Concern  . Not on file  Social History Narrative   Exercise-- no    Past Surgical History:  Procedure Laterality Date  . ABDOMINAL HYSTERECTOMY  1998  . BREAST LUMPECTOMY    . CESAREAN SECTION    . MYOMECTOMY      Family History  Problem Relation Age of Onset  . Dementia Father   . Stroke Father   . Cancer Father 27       prostate  . Atrial fibrillation Father   . Alzheimer's disease Father   . Hyperlipidemia Father   . Hypertension Father   . Heart disease Father   . Hyperlipidemia Brother   . Hypertension Brother   .  Hyperlipidemia Brother   . Hypertension Brother   . Hypertension Brother   . Pulmonary embolism Brother   . Cancer - Colon Brother 60       colon CA--stage 4  . Colon cancer Brother   . Colon cancer Cousin 86  . Hypertension Mother   . Hyperlipidemia Mother   . Heart disease Mother   . Endometrial cancer Unknown   . Atrial fibrillation Unknown   . Diabetes Maternal Aunt   . Diabetes Maternal Uncle   . Kidney disease Maternal Uncle        dialysis  . Cancer Maternal Grandmother        uterine  . Cancer Maternal Grandfather   . Cancer Paternal Grandfather        pancreatic    Allergies  Allergen Reactions  . Oseltamivir Phosphate     Current Outpatient Medications on File Prior to Visit  Medication Sig Dispense Refill  . buPROPion (WELLBUTRIN SR) 150 MG 12 hr tablet Take 1 tablet (150 mg total) by mouth daily. 30 tablet 0  . cyclobenzaprine (FLEXERIL) 10 MG tablet TAKE 1 TABLET BY MOUTH THREE TIMES DAILY AS NEEDED FOR MUSCLE SPASM  30 tablet 0  . fluticasone (FLONASE) 50 MCG/ACT nasal spray Place 2 sprays into both nostrils daily. 48 g 1  . furosemide (LASIX) 20 MG tablet TAKE 1 TABLET BY MOUTH ONCE DAILY 90 tablet 0  . Lactobacillus (PROBIOTIC ACIDOPHILUS PO) Take by mouth.    . latanoprost (XALATAN) 0.005 % ophthalmic solution     . levocetirizine (XYZAL) 5 MG tablet Take 1 tablet (5 mg total) by mouth every evening. 90 tablet 1  . lisinopril (PRINIVIL,ZESTRIL) 10 MG tablet Take 1 tablet (10 mg total) by mouth daily. 90 tablet 3  . meloxicam (MOBIC) 15 MG tablet 1/2-1 po qd prn pain 30 tablet 0  . Multiple Vitamin (MULTIVITAMIN) tablet Take 1 tablet by mouth daily.      . pravastatin (PRAVACHOL) 40 MG tablet TAKE 1 TABLET BY MOUTH ONCE DAILY 90 tablet 1  . Vitamin D, Ergocalciferol, (DRISDOL) 50000 units CAPS capsule Take 1 capsule (50,000 Units total) by mouth every 7 (seven) days. 4 capsule 0   No current facility-administered medications on file prior to visit.     BP 120/74   Temp 98.5 F (36.9 C)   Wt 274 lb (124.3 kg)   BMI 44.22 kg/m       Objective:   Physical Exam Vitals signs and nursing note reviewed.  Cardiovascular:     Rate and Rhythm: Normal rate and regular rhythm.     Pulses: Normal pulses.     Heart sounds: Normal heart sounds.  Pulmonary:     Effort: Pulmonary effort is normal.     Breath sounds: Normal breath sounds.  Skin:    General: Skin is warm and dry.  Neurological:     General: No focal deficit present.     Mental Status: She is oriented to person, place, and time.  Psychiatric:        Mood and Affect: Mood normal.        Behavior: Behavior normal.        Thought Content: Thought content normal.        Judgment: Judgment normal.        Assessment & Plan:  1. Adverse effect of vaccine, initial encounter - We reviewed vaccine reaction to Shingrex on uptodate - The symptoms she experienced were some of the top adverse reactions.  -  Encouraged rest and hydration, she  should feel back to normal within the next two days  - Follow up as needed  Dorothyann Peng, NP

## 2018-05-01 ENCOUNTER — Other Ambulatory Visit: Payer: Self-pay | Admitting: Family Medicine

## 2018-05-01 DIAGNOSIS — E1169 Type 2 diabetes mellitus with other specified complication: Secondary | ICD-10-CM

## 2018-05-01 DIAGNOSIS — E1165 Type 2 diabetes mellitus with hyperglycemia: Secondary | ICD-10-CM

## 2018-05-01 DIAGNOSIS — E785 Hyperlipidemia, unspecified: Secondary | ICD-10-CM

## 2018-05-06 ENCOUNTER — Telehealth: Payer: Self-pay | Admitting: Family Medicine

## 2018-05-06 NOTE — Telephone Encounter (Signed)
Patient called and scheduled.

## 2018-05-06 NOTE — Telephone Encounter (Signed)
-----   Message from Ann Held, DO sent at 05/01/2018  2:26 PM EST ----- Cholesterol--- LDL goal < 70,  HDL >40,  TG < 150.  Diet and exercise will increase HDL and decrease LDL and TG.  Fish,  Fish Oil, Flaxseed oil will also help increase the HDL and decrease Triglycerides.   Recheck labs in 3 months hgba1c creeping up---   Watch simple sugars and starches  Lipid, cmp, hgba1c .

## 2018-05-20 ENCOUNTER — Other Ambulatory Visit: Payer: Self-pay | Admitting: Family Medicine

## 2018-06-20 ENCOUNTER — Other Ambulatory Visit: Payer: Self-pay | Admitting: Family Medicine

## 2018-06-20 DIAGNOSIS — I10 Essential (primary) hypertension: Secondary | ICD-10-CM

## 2018-06-30 DIAGNOSIS — I8311 Varicose veins of right lower extremity with inflammation: Secondary | ICD-10-CM | POA: Diagnosis not present

## 2018-07-05 DIAGNOSIS — I83811 Varicose veins of right lower extremities with pain: Secondary | ICD-10-CM | POA: Diagnosis not present

## 2018-07-05 DIAGNOSIS — I8311 Varicose veins of right lower extremity with inflammation: Secondary | ICD-10-CM | POA: Diagnosis not present

## 2018-07-12 ENCOUNTER — Ambulatory Visit: Payer: 59 | Admitting: Family Medicine

## 2018-07-12 ENCOUNTER — Other Ambulatory Visit: Payer: Self-pay

## 2018-07-12 ENCOUNTER — Encounter: Payer: Self-pay | Admitting: Family Medicine

## 2018-07-12 VITALS — BP 120/70 | HR 77 | Temp 97.9°F | Resp 16 | Ht 66.0 in | Wt 279.0 lb

## 2018-07-12 DIAGNOSIS — M25562 Pain in left knee: Secondary | ICD-10-CM | POA: Diagnosis not present

## 2018-07-12 MED ORDER — DICLOFENAC SODIUM 1 % TD GEL: 4.0000 g | Freq: Four times a day (QID) | TRANSDERMAL | 2 refills | Status: DC

## 2018-07-12 NOTE — Progress Notes (Signed)
Patient ID: Heather [name], female    DOB: 1960-10-20  Age: 58 y.o. MRN: [id number]    Subjective:  Subjective  HPI Heather [name] presents for L knee pain-- no known injury    Pain in the back L knee   Symptoms x a few weeks   Review of Systems  Constitutional: Negative for appetite change, diaphoresis, fatigue and unexpected weight change.  Eyes: Negative for pain, redness and visual disturbance.  Respiratory: Negative for cough, chest tightness, shortness of breath and wheezing.   Cardiovascular: Negative for chest pain, palpitations and leg swelling.  Endocrine: Negative for cold intolerance, heat intolerance, polydipsia, polyphagia and polyuria.  Genitourinary: Negative for difficulty urinating, dysuria and frequency.  Musculoskeletal: Positive for arthralgias, gait problem and joint swelling.  Neurological: Negative for dizziness, light-headedness, numbness and headaches.    History Past Medical History:  Diagnosis Date  . Acute bronchitis   . Allergic rhinitis   . Allergy   . Anemia   . Anxiety   . Arthritis    knees  . Back pain   . Glaucoma   . Hyperlipidemia   . Hypertension   . Joint pain   . Leg edema   . Prediabetes     She has a past surgical history that includes Abdominal hysterectomy (1998); Breast lumpectomy; Cesarean section; and Myomectomy.   Her family history includes Alzheimer's disease in her father; Atrial fibrillation in her father and unknown relative; Cancer in her maternal grandfather, maternal grandmother, and paternal grandfather; Cancer (age of onset: 31) in her father; Cancer - Colon (age of onset: 28) in her brother; Colon cancer in her brother; Colon cancer (age of onset: 51) in her cousin; Dementia in her father; Diabetes in her maternal aunt and maternal uncle; Endometrial cancer in her unknown relative; Heart disease in her father and mother; Hyperlipidemia in her brother, brother, father, and mother; Hypertension in her  brother, brother, brother, father, and mother; Kidney disease in her maternal uncle; Pulmonary embolism in her brother; Stroke in her father.She reports that she has never smoked. She has never used smokeless tobacco. She reports current alcohol use. She reports that she does not use drugs.  Current Outpatient Medications on File Prior to Visit  Medication Sig Dispense Refill  . buPROPion (WELLBUTRIN SR) 150 MG 12 hr tablet Take 1 tablet (150 mg total) by mouth daily. 30 tablet 0  . cyclobenzaprine (FLEXERIL) 10 MG tablet TAKE 1 TABLET BY MOUTH THREE TIMES DAILY AS NEEDED FOR MUSCLE SPASM 30 tablet 0  . fluticasone (FLONASE) 50 MCG/ACT nasal spray Place 2 sprays into both nostrils daily. 48 g 1  . furosemide (LASIX) 20 MG tablet TAKE 1 TABLET BY MOUTH ONCE DAILY 90 tablet 1  . Lactobacillus (PROBIOTIC ACIDOPHILUS PO) Take by mouth.    . latanoprost (XALATAN) 0.005 % ophthalmic solution     . levocetirizine (XYZAL) 5 MG tablet Take 1 tablet (5 mg total) by mouth every evening. 90 tablet 1  . lisinopril (PRINIVIL,ZESTRIL) 10 MG tablet TAKE 1 TABLET BY MOUTH ONCE DAILY 90 tablet 1  . meloxicam (MOBIC) 15 MG tablet 1/2-1 po qd prn pain 30 tablet 0  . Multiple Vitamin (MULTIVITAMIN) tablet Take 1 tablet by mouth daily.      . pravastatin (PRAVACHOL) 40 MG tablet TAKE 1 TABLET BY MOUTH ONCE DAILY 90 tablet 1   No current facility-administered medications on file prior to visit.      Objective:  Objective  Physical Exam  Vitals signs and nursing note reviewed.  Musculoskeletal:        General: Swelling and tenderness present.     Left knee: She exhibits swelling. She exhibits no erythema. Tenderness found.       Legs:    BP 120/70 (BP Location: Right Arm, Cuff Size: Large)   Pulse 77   Temp 97.9 F (36.6 C) (Oral)   Resp 16   Ht 5\' 6"  (1.676 m)   Wt 279 lb (126.6 kg)   SpO2 98%   BMI 45.03 kg/m  Wt Readings from Last 3 Encounters:  07/12/18 279 lb (126.6 kg)  04/29/18 274 lb (124.3  kg)  02/23/18 267 lb (121.1 kg)     Lab Results  Component Value Date   WBC 5.6 09/24/2017   HGB 12.6 09/24/2017   HCT 40.7 09/24/2017   PLT 207 09/24/2017   GLUCOSE 89 04/28/2018   CHOL 204 (H) 04/28/2018   TRIG 86.0 04/28/2018   HDL 59.90 04/28/2018   LDLDIRECT 128.0 01/03/2013   LDLCALC 127 (H) 04/28/2018   ALT 10 04/28/2018   AST 12 04/28/2018   NA 138 04/28/2018   K 3.8 04/28/2018   CL 100 04/28/2018   CREATININE 0.72 04/28/2018   BUN 18 04/28/2018   CO2 32 04/28/2018   TSH 1.530 09/24/2017   HGBA1C 6.1 04/28/2018   MICROALBUR 0.2 01/03/2013    Dg Chest 2 View  Result Date: 05/30/2016 CLINICAL DATA:  Cough, fever and shortness of breath EXAM: CHEST  2 VIEW COMPARISON:  Chest radiograph 06/30/2011 FINDINGS: The lungs are well inflated. Cardiomediastinal contours are normal. No pneumothorax or pleural effusion. No focal airspace consolidation or pulmonary edema. IMPRESSION: No active cardiopulmonary disease. Electronically Signed   By: Ulyses Jarred M.D.   On: 05/30/2016 15:48     Assessment & Plan:  Plan  I have discontinued Tamberly P. Junod's Vitamin D (Ergocalciferol). I am also having her start on diclofenac sodium. Additionally, I am having her maintain her multivitamin, Lactobacillus (PROBIOTIC ACIDOPHILUS PO), latanoprost, meloxicam, fluticasone, buPROPion, levocetirizine, pravastatin, cyclobenzaprine, furosemide, and lisinopril.  Meds ordered this encounter  Medications  . diclofenac sodium (VOLTAREN) 1 % GEL    Sig: Apply 4 g topically 4 (four) times daily.    Dispense:  100 g    Refill:  2    Problem List Items Addressed This Visit    None    Visit Diagnoses    Acute pain of left knee    -  Primary   Relevant Medications   diclofenac sodium (VOLTAREN) 1 % GEL   Other Relevant Orders   Ambulatory referral to Sports Medicine    can get knee sleeve from pharmacy or medical supply Sport med pending  Follow-up: Return if symptoms worsen or fail to  improve.  Ann Held, DO

## 2018-07-12 NOTE — Patient Instructions (Signed)
Acute Knee Pain, Adult  Acute knee pain is sudden and may be caused by damage, swelling, or irritation of the muscles and tissues that support your knee. The injury may result from:   A fall.   An injury to your knee from twisting motions.   A hit to the knee.   Infection.  Acute knee pain may go away on its own with time and rest. If it does not, your health care provider may order tests to find the cause of the pain. These may include:   Imaging tests, such as an X-ray, MRI, or ultrasound.   Joint aspiration. In this test, fluid is removed from the knee.   Arthroscopy. In this test, a lighted tube is inserted into the knee and an image is projected onto a TV screen.   Biopsy. In this test, a sample of tissue is removed from the body and studied under a microscope.  Follow these instructions at home:  Pay attention to any changes in your symptoms. Take these actions to relieve your pain.  If you have a knee sleeve or brace:     Wear the sleeve or brace as told by your health care provider. Remove it only as told by your health care provider.   Loosen the sleeve or brace if your toes tingle, become numb, or turn cold and blue.   Keep the sleeve or brace clean.   If the sleeve or brace is not waterproof:  ? Do not let it get wet.  ? Cover it with a watertight covering when you take a bath or shower.  Activity   Rest your knee.   Do not do things that cause pain or make pain worse.   Avoid high-impact activities or exercises, such as running, jumping rope, or doing jumping jacks.   Work with a physical therapist to make a safe exercise program, as recommended by your health care provider. Do exercises as told by your physical therapist.  Managing pain, stiffness, and swelling     If directed, put ice on the knee:  ? Put ice in a plastic bag.  ? Place a towel between your skin and the bag.  ? Leave the ice on for 20 minutes, 2-3 times a day.   If directed, use an elastic bandage to put pressure  (compression) on your injured knee. This may control swelling, give support, and help with discomfort.  General instructions   Take over-the-counter and prescription medicines only as told by your health care provider.   Raise (elevate) your knee above the level of your heart when you are sitting or lying down.   Sleep with a pillow under your knee.   Do not use any products that contain nicotine or tobacco, such as cigarettes, e-cigarettes, and chewing tobacco. These can delay healing. If you need help quitting, ask your health care provider.   If you are overweight, work with your health care provider and a dietitian to set a weight-loss goal that is healthy and reasonable for you. Extra weight can put pressure on your knee.   Keep all follow-up visits as told by your health care provider. This is important.  Contact a health care provider if:   Your knee pain continues, changes, or gets worse.   You have a fever along with knee pain.   Your knee feels warm to the touch.   Your knee buckles or locks up.  Get help right away if:   Your knee swells,   and the swelling becomes worse.   You cannot move your knee.   You have severe pain in your knee.  Summary   Acute knee pain can be caused by a fall, an injury, an infection, or damage, swelling, or irritation of the tissues that support your knee.   Your health care provider may perform tests to find out the cause of the pain.   Pay attention to any changes in your symptoms. Relieve your pain with rest, medicines, light activity, and use of ice.   Get help if your pain continues or becomes worse, your knee swells, or you cannot move your knee.  This information is not intended to replace advice given to you by your health care provider. Make sure you discuss any questions you have with your health care provider.  Document Released: 02/02/2007 Document Revised: 09/17/2017 Document Reviewed: 09/17/2017  Elsevier Interactive Patient Education  2019  [employer]

## 2018-07-14 ENCOUNTER — Encounter: Payer: Self-pay | Admitting: Family Medicine

## 2018-07-14 ENCOUNTER — Other Ambulatory Visit: Payer: Self-pay

## 2018-07-14 ENCOUNTER — Ambulatory Visit: Payer: 59 | Admitting: Family Medicine

## 2018-07-14 VITALS — BP 73/54 | HR 68 | Temp 97.9°F | Ht 66.0 in | Wt 276.0 lb

## 2018-07-14 DIAGNOSIS — M25562 Pain in left knee: Secondary | ICD-10-CM

## 2018-07-14 MED ORDER — METHYLPREDNISOLONE ACETATE 40 MG/ML IJ SUSP
40.0000 mg | Freq: Once | INTRAMUSCULAR | Status: AC
  Administered 2018-07-14: 40 mg via INTRA_ARTICULAR

## 2018-07-14 NOTE — Progress Notes (Signed)
PCP: Ann Held, DO  Subjective:   HPI: Patient is a 58 y.o. female here for left knee pain.  Patient reports for about 2 weeks she's had pain lateral and posterior left knee. Pain is up to 7/10 and sharp, worse with walking. Bothers more on stairs and feels like it will give out at times. Has been wearing sleeve and using topical voltaren gel. 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 No skin changes, numbness.  Past Medical History:  Diagnosis Date  . Acute bronchitis   . Allergic rhinitis   . Allergy   . Anemia   . Anxiety   . Arthritis    knees  . Back pain   . Glaucoma   . Hyperlipidemia   . Hypertension   . Joint pain   . Leg edema   . Prediabetes     Current Outpatient Medications on File Prior to Visit  Medication Sig Dispense Refill  . buPROPion (WELLBUTRIN SR) 150 MG 12 hr tablet Take 1 tablet (150 mg total) by mouth daily. 30 tablet 0  . cyclobenzaprine (FLEXERIL) 10 MG tablet TAKE 1 TABLET BY MOUTH THREE TIMES DAILY AS NEEDED FOR MUSCLE SPASM 30 tablet 0  . diclofenac sodium (VOLTAREN) 1 % GEL Apply 4 g topically 4 (four) times daily. 100 g 2  . fluticasone (FLONASE) 50 MCG/ACT nasal spray Place 2 sprays into both nostrils daily. 48 g 1  . furosemide (LASIX) 20 MG tablet TAKE 1 TABLET BY MOUTH ONCE DAILY 90 tablet 1  . Lactobacillus (PROBIOTIC ACIDOPHILUS PO) Take by mouth.    . latanoprost (XALATAN) 0.005 % ophthalmic solution     . levocetirizine (XYZAL) 5 MG tablet Take 1 tablet (5 mg total) by mouth every evening. 90 tablet 1  . lisinopril (PRINIVIL,ZESTRIL) 10 MG tablet TAKE 1 TABLET BY MOUTH ONCE DAILY 90 tablet 1  . meloxicam (MOBIC) 15 MG tablet 1/2-1 po qd prn pain 30 tablet 0  . Multiple Vitamin (MULTIVITAMIN) tablet Take 1 tablet by mouth daily.      . pravastatin (PRAVACHOL) 40 MG tablet TAKE 1 TABLET BY MOUTH ONCE DAILY 90 tablet 1   No current facility-administered medications on file prior to visit.     Past Surgical  History:  Procedure Laterality Date  . ABDOMINAL HYSTERECTOMY  1998  . BREAST LUMPECTOMY    . CESAREAN SECTION    . MYOMECTOMY      Allergies  Allergen Reactions  . Oseltamivir Phosphate     Social History   Socioeconomic History  . Marital status: Single    Spouse name: Not on file  . Number of children: Not on file  . Years of education: 41  . Highest education level: Not on file  Occupational History  . Occupation: Counsellor lab  Social Needs  . Financial resource strain: Not on file  . Food insecurity:    Worry: Not on file    Inability: Not on file  . Transportation needs:    Medical: Not on file    Non-medical: Not on file  Tobacco Use  . Smoking status: Never Smoker  . Smokeless tobacco: Never Used  Substance and Sexual Activity  . Alcohol use: Yes    Alcohol/week: 0.0 standard drinks    Comment: occ  . Drug use: No  . Sexual activity: Yes  Lifestyle  . Physical activity:    Days per week: Not on file    Minutes per session: Not on file  .  Stress: Not on file  Relationships  . Social connections:    Talks on phone: Not on file    Gets together: Not on file    Attends religious service: Not on file    Active member of club or organization: Not on file    Attends meetings of clubs or organizations: Not on file    Relationship status: Not on file  . Intimate partner violence:    Fear of current or ex partner: Not on file    Emotionally abused: Not on file    Physically abused: Not on file    Forced sexual activity: Not on file  Other Topics Concern  . Not on file  Social History Narrative   Exercise-- no    Family History  Problem Relation Age of Onset  . Dementia Father   . Stroke Father   . Cancer Father 40       prostate  . Atrial fibrillation Father   . Alzheimer's disease Father   . Hyperlipidemia Father   . Hypertension Father   . Heart disease Father   . Hyperlipidemia Brother   . Hypertension Brother   .  Hyperlipidemia Brother   . Hypertension Brother   . Hypertension Brother   . Pulmonary embolism Brother   . Cancer - Colon Brother 60       colon CA--stage 4  . Colon cancer Brother   . Colon cancer Cousin 68  . Hypertension Mother   . Hyperlipidemia Mother   . Heart disease Mother   . Endometrial cancer Unknown   . Atrial fibrillation Unknown   . Diabetes Maternal Aunt   . Diabetes Maternal Uncle   . Kidney disease Maternal Uncle        dialysis  . Cancer Maternal Grandmother        uterine  . Cancer Maternal Grandfather   . Cancer Paternal Grandfather        pancreatic    BP (!) 73/54   Pulse 68   Temp 97.9 F (36.6 C) (Oral)   Ht 5\' 6"  (1.676 m)   Wt 276 lb (125.2 kg)   BMI 44.55 kg/m   Review of Systems: See HPI above.     Objective:  Physical Exam:  Gen: NAD, comfortable in exam room  Left knee: No gross deformity, ecchymoses.  Mild effusion. TTP medial and lateral joint lines. FROM with 5/5 strength flexion and extension. Negative ant/post drawers. Negative valgus/varus testing. Negative lachmans. Negative mcmurrays, apleys, patellar apprehension. NV intact distally.  Right knee: No deformity. FROM with 5/5 strength. No tenderness to palpation. NVI distally.   Assessment & Plan:  1. Left knee pain - 2/2 mild arthritis with effusion.  Confirmed by ultrasound.  Discussed tylenol, topical medications, supplements that may help.  Continue with sleeve and voltaren gel.  Home exercises reviewed.  Heat or ice.  Injection given today as well.  F/u in 1 month or prn.  After informed written consent timeout was performed, patient was seated on exam table. Left knee was prepped with alcohol swab and utilizing anterolateral approach, patient's left knee was injected intraarticularly with 3:1 bupivicaine: depomedrol. Patient tolerated the procedure well without immediate complications.

## 2018-07-14 NOTE — Patient Instructions (Signed)
Your pain is due to arthritis. These are the different medications you can take for this: Tylenol 500mg  1-2 tabs three times a day for pain. Capsaicin, aspercreme, or biofreeze topically up to four times a day may also help with pain. Some supplements that may help for arthritis: Boswellia extract, curcumin, pycnogenol Voltaren gel up to 4 times a day. Cortisone injections are an option - you were given this today. If cortisone injections do not help, there are different types of shots that may help but they take longer to take effect. It's important that you continue to stay active. Straight leg raises, knee extensions 3 sets of 10 once a day (add ankle weight if these become too easy). Consider physical therapy to strengthen muscles around the joint that hurts to take pressure off of the joint itself. Shoe inserts with good arch support may be helpful. Heat or ice 15 minutes at a time 3-4 times a day as needed to help with pain. Compression sleeve when up and walking around. Water aerobics and cycling with low resistance are the best two types of exercise for arthritis though any exercise is ok as long as it doesn't worsen the pain. Follow up with me in 1 month or as needed.

## 2018-08-05 ENCOUNTER — Other Ambulatory Visit: Payer: 59

## 2018-08-26 ENCOUNTER — Encounter: Payer: 59 | Admitting: Family Medicine

## 2018-08-31 DIAGNOSIS — I8311 Varicose veins of right lower extremity with inflammation: Secondary | ICD-10-CM | POA: Diagnosis not present

## 2018-08-31 DIAGNOSIS — I83811 Varicose veins of right lower extremities with pain: Secondary | ICD-10-CM | POA: Diagnosis not present

## 2018-09-04 ENCOUNTER — Other Ambulatory Visit: Payer: Self-pay | Admitting: Family Medicine

## 2018-10-06 ENCOUNTER — Other Ambulatory Visit: Payer: Self-pay | Admitting: Family Medicine

## 2018-10-06 DIAGNOSIS — M25562 Pain in left knee: Secondary | ICD-10-CM

## 2018-10-25 ENCOUNTER — Encounter (HOSPITAL_BASED_OUTPATIENT_CLINIC_OR_DEPARTMENT_OTHER): Payer: Self-pay | Admitting: *Deleted

## 2018-10-25 ENCOUNTER — Other Ambulatory Visit: Payer: Self-pay

## 2018-10-25 ENCOUNTER — Emergency Department (HOSPITAL_BASED_OUTPATIENT_CLINIC_OR_DEPARTMENT_OTHER)
Admission: EM | Admit: 2018-10-25 | Discharge: 2018-10-25 | Disposition: A | Discharge status: Home / Self Care | Payer: 59 | Attending: Emergency Medicine | Admitting: Emergency Medicine

## 2018-10-25 DIAGNOSIS — M79604 Pain in right leg: Secondary | ICD-10-CM | POA: Insufficient documentation

## 2018-10-25 DIAGNOSIS — E119 Type 2 diabetes mellitus without complications: Secondary | ICD-10-CM | POA: Diagnosis not present

## 2018-10-25 DIAGNOSIS — I10 Essential (primary) hypertension: Secondary | ICD-10-CM | POA: Insufficient documentation

## 2018-10-25 DIAGNOSIS — Z79899 Other long term (current) drug therapy: Secondary | ICD-10-CM | POA: Diagnosis not present

## 2018-10-25 DIAGNOSIS — I259 Chronic ischemic heart disease, unspecified: Secondary | ICD-10-CM | POA: Diagnosis not present

## 2018-10-25 MED ORDER — KETOROLAC TROMETHAMINE 30 MG/ML IJ SOLN
30.0000 mg | Freq: Once | INTRAMUSCULAR | Status: AC
  Administered 2018-10-25: 30 mg via INTRAMUSCULAR
  Filled 2018-10-25: qty 1

## 2018-10-25 MED ORDER — ACETAMINOPHEN 500 MG PO TABS
1000.0000 mg | ORAL_TABLET | Freq: Once | ORAL | Status: AC
  Administered 2018-10-25: 1000 mg via ORAL
  Filled 2018-10-25: qty 2

## 2018-10-25 MED ORDER — DEXAMETHASONE SODIUM PHOSPHATE 10 MG/ML IJ SOLN
10.0000 mg | Freq: Once | INTRAMUSCULAR | Status: AC
  Administered 2018-10-25: 10 mg via INTRAMUSCULAR
  Filled 2018-10-25: qty 1

## 2018-10-25 MED ORDER — TRAMADOL HCL 50 MG PO TABS: 50.0000 mg | ORAL_TABLET | Freq: Four times a day (QID) | ORAL | 0 refills | Status: AC | PRN

## 2018-10-25 NOTE — ED Triage Notes (Signed)
Pt c/o right leg/knee pain  1 day

## 2018-10-25 NOTE — ED Provider Notes (Signed)
[facility]ER[facility] DEPART[facility] Provider Note   CSN: [id number] Arrival date & time: 10/25/18  1539    History   Chief Complaint Chief Complaint  Patient presents with  . Leg Pain    HPI Heather [name] is a 58 y.o. female with history of obesity, HDL, HLD, chronic bilateral knee pain, poorly controlled diabetes, osteoarthritis presents to the ER for evaluation of sudden onset, moderate to severe pain to her right leg.  This began while she was sitting on the couch with her computer working from home.  The pain is on the lateral side of her right thigh and radiates to the right lateral knee.  It is constant.  She cannot find a comfortable position.  Even while sitting down or laying down she has pain.  It is worse when she walks.  She drove herself to the ER and states pushing down on the gas pedal made it worse.  She took a Flexeril and 2 Aleve without relief.  She had bilateral medial knee injections for chronic knee pain by orthopedic doctor last week.  States that she called his office but he was out of town and nobody called her back so she came to the ER.  She denies any falls or trauma.  She denies any recent exercise or heavy activity.  She denies any associated back pain, buttock pain, calf pain, leg swelling or redness, distal paresthesias or numbness.  No history of DVT or PE.     HPI  Past Medical History:  Diagnosis Date  . Acute bronchitis   . Allergic rhinitis   . Allergy   . Anemia   . Anxiety   . Arthritis    knees  . Back pain   . Glaucoma   . Hyperlipidemia   . Hypertension   . Joint pain   . Leg edema   . Prediabetes     Patient Active Problem List   Diagnosis Date Noted  . Allergies 02/24/2018  . Spasm of muscle of lower back 02/24/2018  . Diabetes mellitus ([place]) 02/24/2018  . Right shoulder pain 10/13/2017  . Left arm pain 08/19/2017  . Neck pain on left side 12/16/2016  . Preventative health care 06/29/2016  . CAD (coronary artery  disease) 03/09/2014  . Otitis media 12/09/2013  . Obesity (BMI 30-39.9) 01/07/2013  . Knee pain, bilateral 01/07/2013  . Sinusitis 08/05/2011  . Thumb pain 07/24/2010  . SINUSITIS - ACUTE-NOS 04/19/2009  . OTHER ACUTE REACTIONS TO STRESS 12/22/2008  . GLAUCOMA 08/24/2008  . HYPERGLYCEMIA, FASTING 08/24/2008  . POSTMENOPAUSAL STATUS 08/24/2008  . Pain in limb 10/18/2007  . PLEURAL EFFUSION 07/19/2007  . PNEUMONIA, ORGANISM UNSPECIFIED 06/17/2007  . EDEMA 06/07/2007  . Hyperlipidemia 08/05/2006  . Essential hypertension 08/05/2006  . ALLERGIC RHINITIS 08/05/2006  . CHEST PAIN 08/05/2006    Past Surgical History:  Procedure Laterality Date  . ABDOMINAL HYSTERECTOMY  1998  . BREAST LUMPECTOMY    . CESAREAN SECTION    . MYOMECTOMY       OB History    Gravida  1   Para      Term      Preterm      AB      Living  2     SAB      TAB      Ectopic      Multiple      Live Births  Home Medications    Prior to Admission medications   Medication Sig Start Date End Date Taking? Authorizing Provider  buPROPion (WELLBUTRIN SR) 150 MG 12 hr tablet Take 1 tablet (150 mg total) by mouth daily. 12/07/17   Dennard Nip D, MD  cyclobenzaprine (FLEXERIL) 10 MG tablet TAKE 1 TABLET BY MOUTH THREE TIMES DAILY AS NEEDED FOR MUSCLE SPASM 03/23/18   Carollee Herter, Kendrick Fries R, DO  diclofenac sodium (VOLTAREN) 1 % GEL APPLY 4 GRAMS  TOPICALLY 4 TIMES DAILY 10/07/18   Carollee Herter, Yvonne R, DO  fluticasone (FLONASE) 50 MCG/ACT nasal spray Place 2 sprays into both nostrils daily. 08/24/17   [name] R, DO  furosemide (LASIX) 20 MG tablet TAKE 1 TABLET BY MOUTH ONCE DAILY 05/20/18   Carollee Herter, Kendrick Fries R, DO  Lactobacillus (PROBIOTIC ACIDOPHILUS PO) Take by mouth.    [provider]  latanoprost (XALATAN) 0.005 % ophthalmic solution  03/05/16   [provider]  levocetirizine (XYZAL) 5 MG tablet Take 1 tablet (5 mg total) by mouth every evening.  02/23/18   [name] R, DO  lisinopril (PRINIVIL,ZESTRIL) 10 MG tablet TAKE 1 TABLET BY MOUTH ONCE DAILY 06/21/18   Carollee Herter, Alferd Apa, DO  meloxicam (MOBIC) 15 MG tablet 1/2-1 po qd prn pain 08/17/17   Ann Held, DO  Multiple Vitamin (MULTIVITAMIN) tablet Take 1 tablet by mouth daily.      [provider]  pravastatin (PRAVACHOL) 40 MG tablet Take 1 tablet by mouth once daily 09/06/18   Carollee Herter, Alferd Apa, DO  traMADol (ULTRAM) 50 MG tablet Take 1 tablet (50 mg total) by mouth every 6 (six) hours as needed for up to 3 days. 10/25/18 10/28/18  Kinnie Feil, PA-C    Family History Family History  Problem Relation Age of Onset  . Dementia Father   . Stroke Father   . Cancer Father 50       prostate  . Atrial fibrillation Father   . Alzheimer's disease Father   . Hyperlipidemia Father   . Hypertension Father   . Heart disease Father   . Hyperlipidemia Brother   . Hypertension Brother   . Hyperlipidemia Brother   . Hypertension Brother   . Hypertension Brother   . Pulmonary embolism Brother   . Cancer - Colon Brother 60       colon CA--stage 4  . Colon cancer Brother   . Colon cancer Cousin 49  . Hypertension Mother   . Hyperlipidemia Mother   . Heart disease Mother   . Endometrial cancer Other   . Atrial fibrillation Other   . Diabetes Maternal Aunt   . Diabetes Maternal Uncle   . Kidney disease Maternal Uncle        dialysis  . Cancer Maternal Grandmother        uterine  . Cancer Maternal Grandfather   . Cancer Paternal Grandfather        pancreatic    Social History Social History   Tobacco Use  . Smoking status: Never Smoker  . Smokeless tobacco: Never Used  Substance Use Topics  . Alcohol use: Yes    Alcohol/week: 0.0 standard drinks    Comment: occ  . Drug use: No     Allergies   Oseltamivir phosphate   Review of Systems Review of Systems  Musculoskeletal: Positive for arthralgias, gait problem and myalgias.  All  other systems reviewed and are negative.    Physical Exam  Updated Vital Signs BP (!) 176/86 (BP Location: Right Arm)   Pulse 65   Temp 97.9 F (36.6 C) (Oral)   Resp 16   Ht 5\' 6"  (1.676 m)   Wt 122.5 kg   SpO2 98%   BMI 43.58 kg/m   Physical Exam Constitutional:      Appearance: She is well-developed.     Comments: Patient is frequently moving, repositioning in bed.  States that she cannot find a comfortable position due to the pain.  HENT:     Head: Normocephalic.     Nose: Nose normal.  Eyes:     General: Lids are normal.  Neck:     Musculoskeletal: Normal range of motion.  Cardiovascular:     Rate and Rhythm: Normal rate.     Comments: 1+ DP pulses bilaterally. Pulmonary:     Effort: Pulmonary effort is normal.  Musculoskeletal: Normal range of motion.     Comments: TL spine: No midline or paraspinal muscle tenderness.  Difficult exam due to body habitus.  No SI joint or sciatic notch tenderness.  Negative SLR bilaterally.  Right lower extremity: Mildly reproducible pain along the lateral right thigh and lateral knee.  No right lower extremity edema, erythema.  No calf tenderness.  No posterior or medial leg pain along deeper veins.  No popliteal space pain or fullness.  No focal tenderness to bony prominences of the right knee, ankle.  Full range of motion of right knee and ankle without significant discomfort.  No knee valgus/valgus instability.  Neurological:     Mental Status: She is alert.     Comments: Sensation and strength intact in right lower extremity.  Psychiatric:        Behavior: Behavior normal.      ED Treatments / Results  Labs (all labs ordered are listed, but only abnormal results are displayed) Labs Reviewed - No data to display  EKG None  Radiology No results found.  Procedures Procedures (including critical care time)  Medications Ordered in ED Medications  ketorolac (TORADOL) 30 MG/ML injection 30 mg (30 mg Intramuscular Given  10/25/18 1621)  acetaminophen (TYLENOL) tablet 1,000 mg (1,000 mg Oral Given 10/25/18 1620)  dexamethasone (DECADRON) injection 10 mg (10 mg Intramuscular Given 10/25/18 1623)     Initial Impression / Assessment and Plan / ED Course  I have reviewed the triage vital signs and the nursing notes.  Pertinent labs & imaging results that were available during my care of the patient were reviewed by me and considered in my medical decision making (see chart for details).  I reviewed patient's EMR to obtain pertinent past medical history.  Throughout the year she has had intermittent PCP visits for knee pain, shoulder pain, arm pain.  Seen recently by orthopedist in March for knee pain thought to be from OA.  Exam is overall benign.  She has very mild reproducible tenderness along the right lateral thigh.  She looks uncomfortable and is writhing around in her bed stating that she cannot get comfortable.  However, there is no physical signs or history that would suggest more occult, life-threatening pathology.  Extremities neurovascularly intact.  No asymmetric lower extremity edema, focal calf tenderness, erythema.  She has no history of DVT or PE.  No associated back pain or sciatic notch tenderness.  No recent trauma, falls.  I have low suspicion for infectious process, DVT, bone injury.  Her compartments are soft.  She reports having recent bilateral knee injections medially to the  joint but no signs of secondary infection.  She is given Toradol, Decadron for possible nerve inflammation, Tylenol in the ER.  Given benign history, exam I do not think further emergent work-up including x-rays or imaging is indicated.  Will DC with short course of tramadol, high-dose Tylenol and ibuprofen, Voltaren gel and follow-up with orthopedist.  Return precautions discussed.  Discussed with EDMD.  Final Clinical Impressions(s) / ED Diagnoses   Final diagnoses:  Right leg pain    ED Discharge Orders         Ordered     traMADol (ULTRAM) 50 MG tablet  Every 6 hours PRN     10/25/18 1645           Kinnie Feil, PA-C 10/25/18 1646    Davonna Belling, MD 10/25/18 2310

## 2018-10-25 NOTE — Discharge Instructions (Signed)
You are seen in the ER for right leg pain.  We gave you shots of anti-inflammatories and Tylenol to help with your pain over the next few hours.  The cause of your pain is still unclear but it may be muscular or nerve related.  Take 1000 mg of acetaminophen every 6 hours, for more pain control you can add 400 mg of ibuprofen every 6 hours.  For more severe breakthrough pain take tramadol as prescribed.  You can use Voltaren gel topically, heat or ice whichever makes her pain better.  Follow-up with your orthopedist in the next 48 to 72 hours if the pain does not improve with the above medicines.  Return to the ER if there is any rash, redness to the skin, asymmetric leg swelling or focal calf pain, loss of sensation to your extremity or coolness to your extremity

## 2018-10-25 NOTE — ED Notes (Signed)
C/o rt knee?leg pain x 1 day  Had injection last week

## 2018-10-28 ENCOUNTER — Other Ambulatory Visit: Payer: Self-pay

## 2018-10-28 ENCOUNTER — Emergency Department (HOSPITAL_BASED_OUTPATIENT_CLINIC_OR_DEPARTMENT_OTHER)
Admission: EM | Admit: 2018-10-28 | Discharge: 2018-10-28 | Disposition: A | Discharge status: Home / Self Care | Payer: 59 | Attending: Emergency Medicine | Admitting: Emergency Medicine

## 2018-10-28 DIAGNOSIS — Z79899 Other long term (current) drug therapy: Secondary | ICD-10-CM | POA: Diagnosis not present

## 2018-10-28 DIAGNOSIS — M5416 Radiculopathy, lumbar region: Secondary | ICD-10-CM

## 2018-10-28 DIAGNOSIS — I10 Essential (primary) hypertension: Secondary | ICD-10-CM | POA: Diagnosis not present

## 2018-10-28 DIAGNOSIS — I2510 Atherosclerotic heart disease of native coronary artery without angina pectoris: Secondary | ICD-10-CM | POA: Insufficient documentation

## 2018-10-28 DIAGNOSIS — M79604 Pain in right leg: Secondary | ICD-10-CM | POA: Diagnosis present

## 2018-10-28 MED ORDER — PREDNISONE 10 MG (21) PO TBPK: ORAL_TABLET | ORAL | 0 refills | Status: DC

## 2018-10-28 MED ORDER — HYDROCODONE-ACETAMINOPHEN 5-325 MG PO TABS: 1.0000 | ORAL_TABLET | ORAL | 0 refills | Status: DC | PRN

## 2018-10-28 MED ORDER — DEXAMETHASONE SODIUM PHOSPHATE 10 MG/ML IJ SOLN
10.0000 mg | Freq: Once | INTRAMUSCULAR | Status: AC
  Administered 2018-10-28: 10 mg via INTRAMUSCULAR
  Filled 2018-10-28: qty 1

## 2018-10-28 MED ORDER — CELECOXIB 200 MG PO CAPS: 200.0000 mg | ORAL_CAPSULE | Freq: Two times a day (BID) | ORAL | 0 refills | Status: DC

## 2018-10-28 MED ORDER — KETOROLAC TROMETHAMINE 30 MG/ML IJ SOLN
30.0000 mg | Freq: Once | INTRAMUSCULAR | Status: AC
  Administered 2018-10-28: 10:00:00 30 mg via INTRAMUSCULAR
  Filled 2018-10-28: qty 1

## 2018-10-28 NOTE — ED Provider Notes (Signed)
[facility] DEPARTMENT Provider Note   CSN: [id number] Arrival date & time: 10/28/18  2951    History   Chief Complaint Chief Complaint  Patient presents with  . Leg Pain    HPI Heather [name] is a 58 y.o. female.     Pt presents to the ED today with right leg pain.  She said it's been hurting for the past few days.She came here initially on 7/6 and was given decadron/toradol in the ED and d/c with tramadol.  She went to her vein doctor on the 7th and had a negative [place].  The pt said pain went away, but came back again yesterday.  She denies any back pain or any other sx.  The pt called her orthopedist, but can't get an appointment for several days.  She said she could not sleep last night due to pain.  She describes the pain as radiating down her right leg.     Past Medical History:  Diagnosis Date  . Acute bronchitis   . Allergic rhinitis   . Allergy   . Anemia   . Anxiety   . Arthritis    knees  . Back pain   . Glaucoma   . Hyperlipidemia   . Hypertension   . Joint pain   . Leg edema   . Prediabetes     Patient Active Problem List   Diagnosis Date Noted  . Allergies 02/24/2018  . Spasm of muscle of lower back 02/24/2018  . Diabetes mellitus ([place]) 02/24/2018  . Right shoulder pain 10/13/2017  . Left arm pain 08/19/2017  . Neck pain on left side 12/16/2016  . Preventative health care 06/29/2016  . CAD (coronary artery disease) 03/09/2014  . Otitis media 12/09/2013  . Obesity (BMI 30-39.9) 01/07/2013  . Knee pain, bilateral 01/07/2013  . Sinusitis 08/05/2011  . Thumb pain 07/24/2010  . SINUSITIS - ACUTE-NOS 04/19/2009  . OTHER ACUTE REACTIONS TO STRESS 12/22/2008  . GLAUCOMA 08/24/2008  . HYPERGLYCEMIA, FASTING 08/24/2008  . POSTMENOPAUSAL STATUS 08/24/2008  . Pain in limb 10/18/2007  . PLEURAL EFFUSION 07/19/2007  . PNEUMONIA, ORGANISM UNSPECIFIED 06/17/2007  . EDEMA 06/07/2007  . Hyperlipidemia 08/05/2006  . Essential  hypertension 08/05/2006  . ALLERGIC RHINITIS 08/05/2006  . CHEST PAIN 08/05/2006    Past Surgical History:  Procedure Laterality Date  . ABDOMINAL HYSTERECTOMY  1998  . BREAST LUMPECTOMY    . CESAREAN SECTION    . MYOMECTOMY       OB History    Gravida  1   Para      Term      Preterm      AB      Living  2     SAB      TAB      Ectopic      Multiple      Live Births               Home Medications    Prior to Admission medications   Medication Sig Start Date End Date Taking? Authorizing Provider  buPROPion (WELLBUTRIN SR) 150 MG 12 hr tablet Take 1 tablet (150 mg total) by mouth daily. 12/07/17   Dennard Nip D, MD  celecoxib (CELEBREX) 200 MG capsule Take 1 capsule (200 mg total) by mouth 2 (two) times daily. 10/28/18   Isla Pence, MD  cyclobenzaprine (FLEXERIL) 10 MG tablet TAKE 1 TABLET BY MOUTH THREE TIMES DAILY AS NEEDED FOR MUSCLE SPASM 03/23/18  Carollee Herter, Yvonne R, DO  diclofenac sodium (VOLTAREN) 1 % GEL APPLY 4 GRAMS  TOPICALLY 4 TIMES DAILY 10/07/18   Carollee Herter, Alferd Apa, DO  fluticasone (FLONASE) 50 MCG/ACT nasal spray Place 2 sprays into both nostrils daily. 08/24/17   [name] R, DO  furosemide (LASIX) 20 MG tablet TAKE 1 TABLET BY MOUTH ONCE DAILY 05/20/18   Carollee Herter, Alferd Apa, DO  HYDROcodone-acetaminophen (NORCO/VICODIN) 5-325 MG tablet Take 1 tablet by mouth every 4 (four) hours as needed. 10/28/18   Isla Pence, MD  Lactobacillus (PROBIOTIC ACIDOPHILUS PO) Take by mouth.    [provider]  latanoprost (XALATAN) 0.005 % ophthalmic solution  03/05/16   [provider]  levocetirizine (XYZAL) 5 MG tablet Take 1 tablet (5 mg total) by mouth every evening. 02/23/18   [name] R, DO  lisinopril (PRINIVIL,ZESTRIL) 10 MG tablet TAKE 1 TABLET BY MOUTH ONCE DAILY 06/21/18   Carollee Herter, Alferd Apa, DO  meloxicam (MOBIC) 15 MG tablet 1/2-1 po qd prn pain 08/17/17   Ann Held, DO  Multiple  Vitamin (MULTIVITAMIN) tablet Take 1 tablet by mouth daily.      [provider]  pravastatin (PRAVACHOL) 40 MG tablet Take 1 tablet by mouth once daily 09/06/18   Carollee Herter, Alferd Apa, DO  predniSONE (STERAPRED UNI-PAK 21 TAB) 10 MG (21) TBPK tablet Take 6 tabs for 2 days, then 5 for 2 days, then 4 for 2 days, then 3 for 2 days, 2 for 2 days, then 1 for 2 days 10/28/18   Isla Pence, MD  traMADol (ULTRAM) 50 MG tablet Take 1 tablet (50 mg total) by mouth every 6 (six) hours as needed for up to 3 days. 10/25/18 10/28/18  Kinnie Feil, PA-C    Family History Family History  Problem Relation Age of Onset  . Dementia Father   . Stroke Father   . Cancer Father 34       prostate  . Atrial fibrillation Father   . Alzheimer's disease Father   . Hyperlipidemia Father   . Hypertension Father   . Heart disease Father   . Hyperlipidemia Brother   . Hypertension Brother   . Hyperlipidemia Brother   . Hypertension Brother   . Hypertension Brother   . Pulmonary embolism Brother   . Cancer - Colon Brother 60       colon CA--stage 4  . Colon cancer Brother   . Colon cancer Cousin 71  . Hypertension Mother   . Hyperlipidemia Mother   . Heart disease Mother   . Endometrial cancer Other   . Atrial fibrillation Other   . Diabetes Maternal Aunt   . Diabetes Maternal Uncle   . Kidney disease Maternal Uncle        dialysis  . Cancer Maternal Grandmother        uterine  . Cancer Maternal Grandfather   . Cancer Paternal Grandfather        pancreatic    Social History Social History   Tobacco Use  . Smoking status: Never Smoker  . Smokeless tobacco: Never Used  Substance Use Topics  . Alcohol use: Yes    Alcohol/week: 0.0 standard drinks    Comment: occ  . Drug use: No     Allergies   Oseltamivir phosphate   Review of Systems Review of Systems  Musculoskeletal:       Right leg pain  All other systems reviewed and are negative.  Physical Exam Updated Vital  Signs BP 137/72 (BP Location: Right Arm)   Pulse 80   Temp 98.4 F (36.9 C) (Oral)   Resp 16   Ht 5\' 6"  (1.676 m)   Wt 127 kg   SpO2 100%   BMI 45.19 kg/m   Physical Exam Vitals signs and nursing note reviewed.  Constitutional:      Appearance: Normal appearance. She is obese.  HENT:     Head: Normocephalic and atraumatic.     Right Ear: External ear normal.     Left Ear: External ear normal.     Nose: Nose normal.     Mouth/Throat:     Mouth: Mucous membranes are moist.     Pharynx: Oropharynx is clear.  Eyes:     Extraocular Movements: Extraocular movements intact.     Conjunctiva/sclera: Conjunctivae normal.     Pupils: Pupils are equal, round, and reactive to light.  Neck:     Musculoskeletal: Normal range of motion and neck supple.  Cardiovascular:     Rate and Rhythm: Normal rate and regular rhythm.     Pulses: Normal pulses.     Heart sounds: Normal heart sounds.  Pulmonary:     Effort: Pulmonary effort is normal.     Breath sounds: Normal breath sounds.  Abdominal:     General: Abdomen is flat. Bowel sounds are normal.     Palpations: Abdomen is soft.  Musculoskeletal: Normal range of motion.  Skin:    General: Skin is warm.     Capillary Refill: Capillary refill takes less than 2 seconds.  Neurological:     General: No focal deficit present.     Mental Status: She is alert and oriented to person, place, and time.  Psychiatric:        Mood and Affect: Mood normal.        Behavior: Behavior normal.        Thought Content: Thought content normal.        Judgment: Judgment normal.      ED Treatments / Results  Labs (all labs ordered are listed, but only abnormal results are displayed) Labs Reviewed - No data to display  EKG None  Radiology No results found.  Procedures Procedures (including critical care time)  Medications Ordered in ED Medications  ketorolac (TORADOL) 30 MG/ML injection 30 mg (30 mg Intramuscular Given 10/28/18 0950)   dexamethasone (DECADRON) injection 10 mg (10 mg Intramuscular Given 10/28/18 0951)     Initial Impression / Assessment and Plan / ED Course  I have reviewed the triage vital signs and the nursing notes.  Pertinent labs & imaging results that were available during my care of the patient were reviewed by me and considered in my medical decision making (see chart for details).       Pt's sx c/w radicular pain.  Negative [place] for dvt on the 7th.  No bowel or bladder problems and she is able to walk.  She is given toradol and decadron in ED and d/c home with celebrex and lortab.  She knows to return if worse and to f/u with pcp/ortho.    Final Clinical Impressions(s) / ED Diagnoses   Final diagnoses:  Lumbar radiculopathy    ED Discharge Orders         Ordered    predniSONE (STERAPRED UNI-PAK 21 TAB) 10 MG (21) TBPK tablet     10/28/18 1006    celecoxib (CELEBREX) 200 MG capsule  2 times daily  10/28/18 1006    HYDROcodone-acetaminophen (NORCO/VICODIN) 5-325 MG tablet  Every 4 hours PRN     10/28/18 1006           Isla Pence, MD 10/28/18 1010

## 2018-10-28 NOTE — ED Triage Notes (Signed)
Pt c/o R leg pain for 1 week. She was seen here for same. She has been taking her rx without relief. She states she followed up with a vein specialist and they ruled out a blood clot.

## 2018-10-29 ENCOUNTER — Ambulatory Visit: Payer: 59 | Admitting: Family Medicine

## 2018-11-01 LAB — HM MAMMOGRAPHY

## 2018-12-10 ENCOUNTER — Other Ambulatory Visit: Payer: Self-pay | Admitting: Family Medicine

## 2018-12-21 ENCOUNTER — Encounter: Payer: Self-pay | Admitting: Family Medicine

## 2018-12-21 ENCOUNTER — Ambulatory Visit (INDEPENDENT_AMBULATORY_CARE_PROVIDER_SITE_OTHER): Payer: 59 | Admitting: Family Medicine

## 2018-12-21 ENCOUNTER — Other Ambulatory Visit: Payer: Self-pay

## 2018-12-21 VITALS — BP 126/78 | HR 84 | Temp 96.8°F | Resp 16 | Ht 66.0 in | Wt 270.0 lb

## 2018-12-21 DIAGNOSIS — I10 Essential (primary) hypertension: Secondary | ICD-10-CM | POA: Diagnosis not present

## 2018-12-21 DIAGNOSIS — E785 Hyperlipidemia, unspecified: Secondary | ICD-10-CM

## 2018-12-21 DIAGNOSIS — R232 Flushing: Secondary | ICD-10-CM | POA: Diagnosis not present

## 2018-12-21 DIAGNOSIS — Z23 Encounter for immunization: Secondary | ICD-10-CM

## 2018-12-21 DIAGNOSIS — Z0000 Encounter for general adult medical examination without abnormal findings: Secondary | ICD-10-CM | POA: Diagnosis not present

## 2018-12-21 DIAGNOSIS — I83813 Varicose veins of bilateral lower extremities with pain: Secondary | ICD-10-CM

## 2018-12-21 DIAGNOSIS — E119 Type 2 diabetes mellitus without complications: Secondary | ICD-10-CM

## 2018-12-21 NOTE — Progress Notes (Signed)
Subjective:     Heather [name] is a 58 y.o. female and is here for a comprehensive physical exam. The patient reports problems - pt saw ortho about knees and will have injections .  She also saw [facility] about her varicose veins but she has had a lot of pain in her legs since the procedure.   She also c/o hot flashes that are very troublesome for the last year She is not checking bp or bs.   She is taking her meds as prescribed  Social History   Socioeconomic History  . Marital status: Single    Spouse name: Not on file  . Number of children: Not on file  . Years of education: 39  . Highest education level: Not on file  Occupational History  . Occupation: Counsellor lab  Social Needs  . Financial resource strain: Not on file  . Food insecurity    Worry: Not on file    Inability: Not on file  . Transportation needs    Medical: Not on file    Non-medical: Not on file  Tobacco Use  . Smoking status: Never Smoker  . Smokeless tobacco: Never Used  Substance and Sexual Activity  . Alcohol use: Yes    Alcohol/week: 0.0 standard drinks    Comment: occ  . Drug use: No  . Sexual activity: Yes  Lifestyle  . Physical activity    Days per week: Not on file    Minutes per session: Not on file  . Stress: Not on file  Relationships  . Social Herbalist on phone: Not on file    Gets together: Not on file    Attends religious service: Not on file    Active member of club or organization: Not on file    Attends meetings of clubs or organizations: Not on file    Relationship status: Not on file  . Intimate partner violence    Fear of current or ex partner: Not on file    Emotionally abused: Not on file    Physically abused: Not on file    Forced sexual activity: Not on file  Other Topics Concern  . Not on file  Social History Narrative   Exercise-- no   Health Maintenance  Topic Date Due  . OPHTHALMOLOGY EXAM  01/20/2016  .  TETANUS/TDAP  07/25/2018  . FOOT EXAM  08/25/2018  . HEMOGLOBIN A1C  10/27/2018  . INFLUENZA VACCINE  11/20/2018  . MAMMOGRAM  11/01/2019  . COLONOSCOPY  12/07/2019  . PNEUMOCOCCAL POLYSACCHARIDE VACCINE AGE 58-64 HIGH RISK  Completed  . Hepatitis C Screening  Completed  . HIV Screening  Completed    The following portions of the patient's history were reviewed and updated as appropriate:  She  has a past medical history of Acute bronchitis, Allergic rhinitis, Allergy, Anemia, Anxiety, Arthritis, Back pain, Glaucoma, Hyperlipidemia, Hypertension, Joint pain, Leg edema, and Prediabetes. She does not have any pertinent problems on file. She  has a past surgical history that includes Abdominal hysterectomy (1998); Breast lumpectomy; Cesarean section; and Myomectomy. Her family history includes Alzheimer's disease in her father; Atrial fibrillation in her father and another family member; Cancer in her maternal grandfather, maternal grandmother, and paternal grandfather; Cancer (age of onset: 55) in her father; Cancer - Colon (age of onset: 37) in her brother; Colon cancer in her brother; Colon cancer (age of onset: 13) in her cousin; Dementia in her father; Diabetes in  her maternal aunt and maternal uncle; Endometrial cancer in an other family member; Heart disease in her father and mother; Hyperlipidemia in her brother, brother, father, and mother; Hypertension in her brother, brother, brother, father, and mother; Kidney disease in her maternal uncle; Pulmonary embolism in her brother; Stroke in her father. She  reports that she has never smoked. She has never used smokeless tobacco. She reports current alcohol use. She reports that she does not use drugs. She has a current medication list which includes the following prescription(s): cyclobenzaprine, diclofenac sodium, fluticasone, furosemide, lactobacillus, latanoprost, levocetirizine, lisinopril, multivitamin, pravastatin, bupropion, and  meloxicam. Current Outpatient Medications on File Prior to Visit  Medication Sig Dispense Refill  . cyclobenzaprine (FLEXERIL) 10 MG tablet TAKE 1 TABLET BY MOUTH THREE TIMES DAILY AS NEEDED FOR MUSCLE SPASM 30 tablet 0  . diclofenac sodium (VOLTAREN) 1 % GEL APPLY 4 GRAMS  TOPICALLY 4 TIMES DAILY 100 g 0  . fluticasone (FLONASE) 50 MCG/ACT nasal spray Place 2 sprays into both nostrils daily. 48 g 1  . furosemide (LASIX) 20 MG tablet Take 1 tablet by mouth once daily 90 tablet 0  . Lactobacillus (PROBIOTIC ACIDOPHILUS PO) Take by mouth.    . latanoprost (XALATAN) 0.005 % ophthalmic solution     . levocetirizine (XYZAL) 5 MG tablet Take 1 tablet (5 mg total) by mouth every evening. 90 tablet 1  . lisinopril (PRINIVIL,ZESTRIL) 10 MG tablet TAKE 1 TABLET BY MOUTH ONCE DAILY 90 tablet 1  . Multiple Vitamin (MULTIVITAMIN) tablet Take 1 tablet by mouth daily.      . pravastatin (PRAVACHOL) 40 MG tablet Take 1 tablet by mouth once daily 90 tablet 1  . buPROPion (WELLBUTRIN SR) 150 MG 12 hr tablet Take 1 tablet (150 mg total) by mouth daily. 30 tablet 0  . meloxicam (MOBIC) 15 MG tablet 1/2-1 po qd prn pain (Patient not taking: Reported on 12/21/2018) 30 tablet 0   No current facility-administered medications on file prior to visit.    She is allergic to oseltamivir phosphate..  Review of Systems Review of Systems  Constitutional: Negative for activity change, appetite change and fatigue.  HENT: Negative for hearing loss, congestion, tinnitus and ear discharge.  dentist q35m Eyes: Negative for visual disturbance (see optho q1y -- vision corrected to 20/20 with glasses).  Respiratory: Negative for cough, chest tightness and shortness of breath.   Cardiovascular: Negative for chest pain, palpitations and leg swelling.  Gastrointestinal: Negative for abdominal pain, diarrhea, constipation and abdominal distention.  Genitourinary: Negative for urgency, frequency, decreased urine volume and difficulty  urinating.  Musculoskeletal: Negative for back pain, arthralgias and gait problem.  Skin: Negative for color change, pallor and rash.  Neurological: Negative for dizziness, light-headedness, numbness and headaches.  Hematological: Negative for adenopathy. Does not bruise/bleed easily.  Psychiatric/Behavioral: Negative for suicidal ideas, confusion, sleep disturbance, self-injury, dysphoric mood, decreased concentration and agitation.       Objective:    BP 126/78 (BP Location: Right Arm, Cuff Size: Large)   Pulse 84   Temp (!) 96.8 F (36 C) (Temporal)   Resp 16   Ht 5\' 6"  (1.676 m)   Wt 270 lb (122.5 kg)   SpO2 98%   BMI 43.58 kg/m  General appearance: alert, cooperative, appears stated age and no distress Head: Normocephalic, without obvious abnormality, atraumatic Eyes: conjunctivae/corneas clear. PERRL, EOM's intact. Fundi benign. Ears: normal TM's and external ear canals both ears Nose: Nares normal. Septum midline. Mucosa normal. No drainage or sinus tenderness.  Throat: lips, mucosa, and tongue normal; teeth and gums normal Neck: no adenopathy, no carotid bruit, no JVD, supple, symmetrical, trachea midline and thyroid not enlarged, symmetric, no tenderness/mass/nodules Back: symmetric, no curvature. ROM normal. No CVA tenderness. Lungs: clear to auscultation bilaterally Breasts: normal appearance, no masses or tenderness Heart: regular rate and rhythm, S1, S2 normal, no murmur, click, rub or gallop Abdomen: soft, non-tender; bowel sounds normal; no masses,  no organomegaly Pelvic: deferred Extremities: extremities normal, atraumatic, no cyanosis or edema Pulses: 2+ and symmetric Skin: Skin color, texture, turgor normal. No rashes or lesions Lymph nodes: Cervical, supraclavicular, and axillary nodes normal. Neurologic: Alert and oriented X 3, normal strength and tone. Normal symmetric reflexes. Normal coordination and gait   Diabetic Foot Exam - Simple   Simple Foot  Form Diabetic Foot exam was performed with the following findings: Yes 12/21/2018  5:16 PM  Visual Inspection No deformities, no ulcerations, no other skin breakdown bilaterally: Yes Sensation Testing Intact to touch and monofilament testing bilaterally: Yes Pulse Check Posterior Tibialis and Dorsalis pulse intact bilaterally: Yes Comments      Assessment:    Healthy female exam.      Plan:    ghm utd Check labs  See After Visit Summary for Counseling Recommendations    1. Varicose veins of bilateral lower extremities with pain  - Ambulatory referral to Vascular Surgery  2. Hot flashes Pt not a good candidate for HRT And she does not want to try anything else right now  - Estradiol - LH - FSH - Progesterone  3. Preventative health care See above  - Lipid panel - TSH - CBC with Differential/Platelet - Comprehensive metabolic panel  4. Essential hypertension Well controlled, no changes to meds. Encouraged heart healthy diet such as the DASH diet and exercise as tolerated.   - CBC with Differential/Platelet - Comprehensive metabolic panel  5. Hyperlipidemia, unspecified hyperlipidemia type Encouraged heart healthy diet, increase exercise, avoid trans fats, consider a krill oil cap daily - Lipid panel - Comprehensive metabolic panel  6. Diet-controlled diabetes mellitus ([place]) Check labs  - Hemoglobin A1c  7. Influenza vaccine administered   - Flu Vaccine QUAD 36+ mos IM (Fluarix & Fluzone Quad PF  8. Need for Tdap vaccination   - Tdap vaccine greater than or equal to 7yo IM

## 2018-12-21 NOTE — Patient Instructions (Signed)

## 2018-12-22 ENCOUNTER — Other Ambulatory Visit (INDEPENDENT_AMBULATORY_CARE_PROVIDER_SITE_OTHER): Payer: 59

## 2018-12-22 DIAGNOSIS — E1165 Type 2 diabetes mellitus with hyperglycemia: Secondary | ICD-10-CM

## 2018-12-22 DIAGNOSIS — E785 Hyperlipidemia, unspecified: Secondary | ICD-10-CM

## 2018-12-22 DIAGNOSIS — E1169 Type 2 diabetes mellitus with other specified complication: Secondary | ICD-10-CM | POA: Diagnosis not present

## 2018-12-22 LAB — COMPREHENSIVE METABOLIC PANEL
ALT: 8 U/L (ref 0–35)
AST: 11 U/L (ref 0–37)
Albumin: 3.9 g/dL (ref 3.5–5.2)
Alkaline Phosphatase: 51 U/L (ref 39–117)
BUN: 15 mg/dL (ref 6–23)
CO2: 31 mEq/L (ref 19–32)
Calcium: 9.2 mg/dL (ref 8.4–10.5)
Chloride: 103 mEq/L (ref 96–112)
Creatinine, Ser: 0.74 mg/dL (ref 0.40–1.20)
GFR: 97.05 mL/min (ref >60.00)
Glucose, Bld: 99 mg/dL (ref 70–99)
Potassium: 4.3 mEq/L (ref 3.5–5.1)
Sodium: 138 mEq/L (ref 135–145)
Total Bilirubin: 0.4 mg/dL (ref 0.2–1.2)
Total Protein: 6.7 g/dL (ref 6.0–8.3)

## 2018-12-22 LAB — LIPID PANEL
Cholesterol: 179 mg/dL (ref 0–200)
HDL: 55.3 mg/dL (ref >39.00)
LDL Cholesterol: 106 mg/dL — ABNORMAL HIGH (ref 0–99)
NonHDL: 123.53
Total CHOL/HDL Ratio: 3
Triglycerides: 86 mg/dL (ref 0.0–149.0)
VLDL: 17.2 mg/dL (ref 0.0–40.0)

## 2018-12-22 LAB — HEMOGLOBIN A1C: Hgb A1c MFr Bld: 6.2 % (ref 4.6–6.5)

## 2019-01-10 ENCOUNTER — Other Ambulatory Visit: Payer: Self-pay | Admitting: Family Medicine

## 2019-01-10 DIAGNOSIS — I10 Essential (primary) hypertension: Secondary | ICD-10-CM

## 2019-02-10 ENCOUNTER — Encounter (HOSPITAL_COMMUNITY): Payer: 59

## 2019-02-10 ENCOUNTER — Encounter: Payer: 59 | Admitting: Vascular Surgery

## 2019-03-09 ENCOUNTER — Other Ambulatory Visit: Payer: Self-pay | Admitting: Family Medicine

## 2019-04-09 ENCOUNTER — Other Ambulatory Visit: Payer: Self-pay | Admitting: Family Medicine

## 2019-04-09 DIAGNOSIS — I10 Essential (primary) hypertension: Secondary | ICD-10-CM

## 2019-05-05 ENCOUNTER — Ambulatory Visit (INDEPENDENT_AMBULATORY_CARE_PROVIDER_SITE_OTHER): Payer: 59 | Admitting: Family Medicine

## 2019-05-05 ENCOUNTER — Encounter: Payer: Self-pay | Admitting: Family Medicine

## 2019-05-05 ENCOUNTER — Other Ambulatory Visit: Payer: Self-pay

## 2019-05-05 VITALS — BP 161/85 | HR 98 | Ht 66.0 in | Wt 263.0 lb

## 2019-05-05 DIAGNOSIS — R600 Localized edema: Secondary | ICD-10-CM

## 2019-05-05 DIAGNOSIS — J0140 Acute pansinusitis, unspecified: Secondary | ICD-10-CM

## 2019-05-05 MED ORDER — AMOXICILLIN-POT CLAVULANATE 875-125 MG PO TABS: 1.0000 | ORAL_TABLET | Freq: Two times a day (BID) | ORAL | 0 refills | Status: DC

## 2019-05-05 MED ORDER — FUROSEMIDE 20 MG PO TABS: 20.0000 mg | ORAL_TABLET | Freq: Every day | ORAL | 0 refills | Status: DC

## 2019-05-05 NOTE — Progress Notes (Signed)
Virtual Visit via Video Note  I connected with Heather [name] on 05/05/19 at 11:20 AM EST by a video enabled telemedicine application and verified that I am speaking with the correct person using two identifiers.  Location: Patient: home  Provider: home    I discussed the limitations of evaluation and management by telemedicine and the availability of in person appointments. The patient expressed understanding and agreed to proceed.  History of Present Illness: Pt is home c/o sinus congestion , no fever No other complaints    Observations/Objective: Vitals:   05/05/19 1114  BP: (!) 161/85  Pulse: 98  SpO2: 97%  repeat bp 117/70   p 101  Assessment and Plan: 1. Acute non-recurrent pansinusitis Abx, flonase and antihistamine  covid test to be done  - amoxicillin-clavulanate (AUGMENTIN) 875-125 MG tablet; Take 1 tablet by mouth 2 (two) times daily.  Dispense: 20 tablet; Refill: 0  2. Lower extremity edema Refill meds Stable  - furosemide (LASIX) 20 MG tablet; Take 1 tablet (20 mg total) by mouth daily.  Dispense: 90 tablet; Refill: 0   Follow Up Instructions:    I discussed the assessment and treatment plan with the patient. The patient was provided an opportunity to ask questions and all were answered. The patient agreed with the plan and demonstrated an understanding of the instructions.   The patient was advised to call back or seek an in-person evaluation if the symptoms worsen or if the condition fails to improve as anticipated.     Ann Held, DO

## 2019-05-06 ENCOUNTER — Ambulatory Visit: Payer: 59 | Attending: Internal Medicine

## 2019-05-06 DIAGNOSIS — Z20822 Contact with and (suspected) exposure to covid-19: Secondary | ICD-10-CM

## 2019-05-07 LAB — NOVEL CORONAVIRUS, NAA: SARS-CoV-2, NAA: NOT DETECTED

## 2019-05-11 ENCOUNTER — Other Ambulatory Visit: Payer: Self-pay

## 2019-05-11 DIAGNOSIS — I83893 Varicose veins of bilateral lower extremities with other complications: Secondary | ICD-10-CM

## 2019-05-12 ENCOUNTER — Other Ambulatory Visit: Payer: Self-pay

## 2019-05-12 ENCOUNTER — Ambulatory Visit (INDEPENDENT_AMBULATORY_CARE_PROVIDER_SITE_OTHER): Payer: 59 | Admitting: Vascular Surgery

## 2019-05-12 ENCOUNTER — Encounter: Payer: Self-pay | Admitting: Vascular Surgery

## 2019-05-12 ENCOUNTER — Ambulatory Visit (HOSPITAL_COMMUNITY)
Admission: RE | Admit: 2019-05-12 | Discharge: 2019-05-12 | Disposition: A | Discharge status: Home / Self Care | Payer: 59 | Source: Ambulatory Visit | Attending: Vascular Surgery | Admitting: Vascular Surgery

## 2019-05-12 VITALS — BP 139/79 | HR 76 | Temp 97.9°F | Resp 14 | Ht 67.0 in | Wt 268.0 lb

## 2019-05-12 DIAGNOSIS — I83893 Varicose veins of bilateral lower extremities with other complications: Secondary | ICD-10-CM | POA: Diagnosis present

## 2019-05-12 DIAGNOSIS — I872 Venous insufficiency (chronic) (peripheral): Secondary | ICD-10-CM

## 2019-05-12 NOTE — Progress Notes (Signed)
REASON FOR CONSULT:    Chronic venous insufficiency.  The consult is requested by Dr. Etter Sjogren.  ASSESSMENT & PLAN:   CHRONIC VENOUS INSUFFICIENCY: This patient does have some deep venous reflux on the right and some reflux in the saphenofemoral junction on the left.  It is difficult to attribute her symptoms in the left leg to venous disease given that she has no deep venous reflux on the left and her left great saphenous vein has been previously ablated.  Regardless I have discussed with her the importance of intermittent leg elevation and the proper positioning for this.  I have encouraged her to continue to wear her compression stockings.  I have encouraged her to avoid prolonged sitting and standing.  We have discussed the importance of exercise specifically walking and water aerobics.  We have also discussed the importance of maintaining a healthy weight as central obesity especially increases lower extremity venous pressure.  Given the location of the pain I am not convinced that this is a saphenous nerve related injury.  I will be happy to see her back at any time if her symptoms worsen or she develops new evidence venous disease.   Deitra Mayo, MD Office: [phone]   HPI:   Heather [name] is a pleasant 59 y.o. female, who presents for evaluation of left leg pain.  This patient has undergone laser ablation of both great saphenous veins at [facility].  I have reviewed the records from that office.  The patient was initially seen in June 2019 with venous insufficiency.  She failed conservative treatment and ultimately underwent staged ablation of the great saphenous veins.  On 01/06/2018 she had laser ablation of the right great saphenous vein.  On 01/08/2018 she had laser ablation of the left great saphenous vein.  On 01/11/2018 she had laser ablation of an anterior accessory right great saphenous vein.  She states that since the procedure she has had some pain along the medial  aspect of her distal left thigh and proximal left calf.  The pain does not appear to be distal to that which would make me think this is not a nerve related.  She denies any previous history of DVT or phlebitis.  She has been wearing compression stockings which help her symptoms some.  She elevates her legs which help her symptoms some.  She describes tightness in her left lower leg and aching pain also.  Her risk factors for peripheral vascular disease include hypertension and hypercholesterolemia.  She denies any history of diabetes, family history of premature cardiovascular disease or tobacco use.  Past Medical History:  Diagnosis Date  . Acute bronchitis   . Allergic rhinitis   . Allergy   . Anemia   . Anxiety   . Arthritis    knees  . Back pain   . Glaucoma   . Hyperlipidemia   . Hypertension   . Joint pain   . Leg edema   . Prediabetes     Family History  Problem Relation Age of Onset  . Dementia Father   . Stroke Father   . Cancer Father 26       prostate  . Atrial fibrillation Father   . Alzheimer's disease Father   . Hyperlipidemia Father   . Hypertension Father   . Heart disease Father   . Hyperlipidemia Brother   . Hypertension Brother   . Hyperlipidemia Brother   . Hypertension Brother   . Hypertension Brother   . Pulmonary  embolism Brother   . Cancer - Colon Brother 60       colon CA--stage 4  . Colon cancer Brother   . Colon cancer Cousin 32  . Hypertension Mother   . Hyperlipidemia Mother   . Heart disease Mother   . Endometrial cancer Other   . Atrial fibrillation Other   . Diabetes Maternal Aunt   . Diabetes Maternal Uncle   . Kidney disease Maternal Uncle        dialysis  . Cancer Maternal Grandmother        uterine  . Cancer Maternal Grandfather   . Cancer Paternal Grandfather        pancreatic    SOCIAL HISTORY: Social History   Socioeconomic History  . Marital status: Single    Spouse name: Not on file  . Number of children: Not  on file  . Years of education: 72  . Highest education level: Not on file  Occupational History  . Occupation: Counsellor lab  Tobacco Use  . Smoking status: Never Smoker  . Smokeless tobacco: Never Used  Substance and Sexual Activity  . Alcohol use: Yes    Alcohol/week: 0.0 standard drinks    Comment: occ  . Drug use: No  . Sexual activity: Yes  Other Topics Concern  . Not on file  Social History Narrative   Exercise-- no   Social Determinants of Health   Financial Resource Strain:   . Difficulty of Paying Living Expenses: Not on file  Food Insecurity:   . Worried About Charity fundraiser in the Last Year: Not on file  . Ran Out of Food in the Last Year: Not on file  Transportation Needs:   . Lack of Transportation (Medical): Not on file  . Lack of Transportation (Non-Medical): Not on file  Physical Activity:   . Days of Exercise per Week: Not on file  . Minutes of Exercise per Session: Not on file  Stress:   . Feeling of Stress : Not on file  Social Connections:   . Frequency of Communication with Friends and Family: Not on file  . Frequency of Social Gatherings with Friends and Family: Not on file  . Attends Religious Services: Not on file  . Active Member of Clubs or Organizations: Not on file  . Attends Archivist Meetings: Not on file  . Marital Status: Not on file  Intimate Partner Violence:   . Fear of Current or Ex-Partner: Not on file  . Emotionally Abused: Not on file  . Physically Abused: Not on file  . Sexually Abused: Not on file    Allergies  Allergen Reactions  . Oseltamivir Phosphate     Current Outpatient Medications  Medication Sig Dispense Refill  . amoxicillin-clavulanate (AUGMENTIN) 875-125 MG tablet Take 1 tablet by mouth 2 (two) times daily. 20 tablet 0  . buPROPion (WELLBUTRIN SR) 150 MG 12 hr tablet Take 1 tablet (150 mg total) by mouth daily. 30 tablet 0  . cyclobenzaprine (FLEXERIL) 10 MG tablet  TAKE 1 TABLET BY MOUTH THREE TIMES DAILY AS NEEDED FOR MUSCLE SPASM 30 tablet 0  . diclofenac sodium (VOLTAREN) 1 % GEL APPLY 4 GRAMS  TOPICALLY 4 TIMES DAILY 100 g 0  . fluticasone (FLONASE) 50 MCG/ACT nasal spray Place 2 sprays into both nostrils daily. 48 g 1  . furosemide (LASIX) 20 MG tablet Take 1 tablet (20 mg total) by mouth daily. 90 tablet 0  . Lactobacillus (PROBIOTIC ACIDOPHILUS  PO) Take by mouth.    . latanoprost (XALATAN) 0.005 % ophthalmic solution     . levocetirizine (XYZAL) 5 MG tablet Take 1 tablet (5 mg total) by mouth every evening. 90 tablet 1  . lisinopril (ZESTRIL) 10 MG tablet Take 1 tablet by mouth once daily 90 tablet 0  . meloxicam (MOBIC) 15 MG tablet 1/2-1 po qd prn pain 30 tablet 0  . Multiple Vitamin (MULTIVITAMIN) tablet Take 1 tablet by mouth daily.      . pravastatin (PRAVACHOL) 40 MG tablet Take 1 tablet by mouth once daily 90 tablet 0   No current facility-administered medications for this visit.    REVIEW OF SYSTEMS:  [X]  denotes positive finding, [ ]  denotes negative finding Cardiac  Comments:  Chest pain or chest pressure:    Shortness of breath upon exertion:    Short of breath when lying flat:    Irregular heart rhythm:        Vascular    Pain in calf, thigh, or hip brought on by ambulation: x   Pain in feet at night that wakes you up from your sleep:     Blood clot in your veins:    Leg swelling:  x       Pulmonary    Oxygen at home:    Productive cough:     Wheezing:         Neurologic    Sudden weakness in arms or legs:     Sudden numbness in arms or legs:     Sudden onset of difficulty speaking or slurred speech:    Temporary loss of vision in one eye:     Problems with dizziness:         Gastrointestinal    Blood in stool:     Vomited blood:         Genitourinary    Burning when urinating:     Blood in urine:        Psychiatric    Major depression:         Hematologic    Bleeding problems:    Problems with blood  clotting too easily:        Skin    Rashes or ulcers:        Constitutional    Fever or chills:     PHYSICAL EXAM:   Vitals:   05/12/19 1454  BP: 139/79  Pulse: 76  Resp: 14  Temp: 97.9 F (36.6 C)  TempSrc: Temporal  SpO2: 100%  Weight: 268 lb (121.6 kg)  Height: 5\' 7"  (1.702 m)   Body mass index is 41.97 kg/m.  GENERAL: The patient is a well-nourished female, in no acute distress. The vital signs are documented above. CARDIAC: There is a regular rate and rhythm.  VASCULAR: I do not detect carotid bruits. She has palpable pedal pulses. She has mild bilateral lower extremity swelling. I looked at her left great saphenous vein myself with the SonoSite and there is a patent segment in the thigh but this is not dilated and there is no reflux noted here. PULMONARY: There is good air exchange bilaterally without wheezing or rales. ABDOMEN: Soft and non-tender with normal pitched bowel sounds.  MUSCULOSKELETAL: There are no major deformities or cyanosis. NEUROLOGIC: No focal weakness or paresthesias are detected. SKIN: There are no ulcers or rashes noted. PSYCHIATRIC: The patient has a normal affect.  DATA:    VENOUS DUPLEX: I have independently interpreted her venous duplex scan today.  On the right side there is no evidence of DVT or superficial venous thrombosis.  There is deep venous reflux involving the common femoral vein.  There is no superficial venous reflux.  The right great saphenous vein has been previously ablated.  On the left side there is no evidence of DVT or superficial venous thrombosis.  There is no deep venous reflux.  There is some reflux at the saphenofemoral junction only on the left.  The distal thigh saphenous vein has been previously ablated.

## 2019-06-07 ENCOUNTER — Encounter: Payer: Self-pay | Admitting: Family Medicine

## 2019-06-21 ENCOUNTER — Ambulatory Visit: Payer: 59 | Admitting: Family Medicine

## 2019-06-22 ENCOUNTER — Other Ambulatory Visit: Payer: Self-pay

## 2019-06-23 ENCOUNTER — Ambulatory Visit: Payer: 59 | Admitting: Family Medicine

## 2019-06-23 ENCOUNTER — Encounter: Payer: Self-pay | Admitting: Family Medicine

## 2019-06-23 ENCOUNTER — Other Ambulatory Visit: Payer: Self-pay

## 2019-06-23 VITALS — BP 130/80 | HR 89 | Temp 97.6°F | Resp 18 | Ht 67.0 in | Wt 275.2 lb

## 2019-06-23 DIAGNOSIS — E785 Hyperlipidemia, unspecified: Secondary | ICD-10-CM

## 2019-06-23 DIAGNOSIS — I10 Essential (primary) hypertension: Secondary | ICD-10-CM

## 2019-06-23 DIAGNOSIS — E1139 Type 2 diabetes mellitus with other diabetic ophthalmic complication: Secondary | ICD-10-CM

## 2019-06-23 DIAGNOSIS — E1169 Type 2 diabetes mellitus with other specified complication: Secondary | ICD-10-CM

## 2019-06-23 DIAGNOSIS — E119 Type 2 diabetes mellitus without complications: Secondary | ICD-10-CM | POA: Diagnosis not present

## 2019-06-23 LAB — HEMOGLOBIN A1C: Hgb A1c MFr Bld: 5.9 % (ref 4.6–6.5)

## 2019-06-23 LAB — COMPREHENSIVE METABOLIC PANEL
ALT: 12 U/L (ref 0–35)
AST: 15 U/L (ref 0–37)
Albumin: 4.1 g/dL (ref 3.5–5.2)
Alkaline Phosphatase: 56 U/L (ref 39–117)
BUN: 16 mg/dL (ref 6–23)
CO2: 33 mEq/L — ABNORMAL HIGH (ref 19–32)
Calcium: 10.1 mg/dL (ref 8.4–10.5)
Chloride: 102 mEq/L (ref 96–112)
Creatinine, Ser: 0.81 mg/dL (ref 0.40–1.20)
GFR: 87.28 mL/min (ref >60.00)
Glucose, Bld: 88 mg/dL (ref 70–99)
Potassium: 4.7 mEq/L (ref 3.5–5.1)
Sodium: 140 mEq/L (ref 135–145)
Total Bilirubin: 0.3 mg/dL (ref 0.2–1.2)
Total Protein: 7 g/dL (ref 6.0–8.3)

## 2019-06-23 LAB — MICROALBUMIN / CREATININE URINE RATIO
Creatinine,U: 68.1 mg/dL
Microalb Creat Ratio: 1 mg/g (ref 0.0–30.0)
Microalb, Ur: <0.7 mg/dL (ref 0.0–1.9)

## 2019-06-23 LAB — LIPID PANEL
Cholesterol: 196 mg/dL (ref 0–200)
HDL: 63.8 mg/dL (ref >39.00)
LDL Cholesterol: 113 mg/dL — ABNORMAL HIGH (ref 0–99)
NonHDL: 131.92
Total CHOL/HDL Ratio: 3
Triglycerides: 96 mg/dL (ref 0.0–149.0)
VLDL: 19.2 mg/dL (ref 0.0–40.0)

## 2019-06-23 NOTE — Patient Instructions (Signed)
Carbohydrate Counting for Diabetes Mellitus, Adult  Carbohydrate counting is a method of keeping track of how many carbohydrates you eat. Eating carbohydrates naturally increases the amount of sugar (glucose) in the blood. Counting how many carbohydrates you eat helps keep your blood glucose within normal limits, which helps you manage your diabetes (diabetes mellitus). It is important to know how many carbohydrates you can safely have in each meal. This is different for every person. A diet and nutrition specialist (registered dietitian) can help you make a meal plan and calculate how many carbohydrates you should have at each meal and snack. Carbohydrates are found in the following foods:  Grains, such as breads and cereals.  Dried beans and soy products.  Starchy vegetables, such as potatoes, peas, and corn.  Fruit and fruit juices.  Milk and yogurt.  Sweets and snack foods, such as cake, cookies, candy, chips, and soft drinks. How do I count carbohydrates? There are two ways to count carbohydrates in food. You can use either of the methods or a combination of both. Reading "Nutrition Facts" on packaged food The "Nutrition Facts" list is included on the labels of almost all packaged foods and beverages in the [place] It includes:  The serving size.  Information about nutrients in each serving, including the grams (g) of carbohydrate per serving. To use the "Nutrition Facts":  Decide how many servings you will have.  Multiply the number of servings by the number of carbohydrates per serving.  The resulting number is the total amount of carbohydrates that you will be having. Learning standard serving sizes of other foods When you eat carbohydrate foods that are not packaged or do not include "Nutrition Facts" on the label, you need to measure the servings in order to count the amount of carbohydrates:  Measure the foods that you will eat with a food scale or measuring cup, if  needed.  Decide how many standard-size servings you will eat.  Multiply the number of servings by 15. Most carbohydrate-rich foods have about 15 g of carbohydrates per serving. ? For example, if you eat 8 oz (170 g) of strawberries, you will have eaten 2 servings and 30 g of carbohydrates (2 servings x 15 g = 30 g).  For foods that have more than one food mixed, such as soups and casseroles, you must count the carbohydrates in each food that is included. The following list contains standard serving sizes of common carbohydrate-rich foods. Each of these servings has about 15 g of carbohydrates:   hamburger bun or  English muffin.   oz (15 mL) syrup.   oz (14 g) jelly.  1 slice of bread.  1 six-inch tortilla.  3 oz (85 g) cooked rice or pasta.  4 oz (113 g) cooked dried beans.  4 oz (113 g) starchy vegetable, such as peas, corn, or potatoes.  4 oz (113 g) hot cereal.  4 oz (113 g) mashed potatoes or  of a large baked potato.  4 oz (113 g) canned or frozen fruit.  4 oz (120 mL) fruit juice.  4-6 crackers.  6 chicken nuggets.  6 oz (170 g) unsweetened dry cereal.  6 oz (170 g) plain fat-free yogurt or yogurt sweetened with artificial sweeteners.  8 oz (240 mL) milk.  8 oz (170 g) fresh fruit or one small piece of fruit.  24 oz (680 g) popped popcorn. Example of carbohydrate counting Sample meal  3 oz (85 g) chicken breast.  6 oz (170 g)   brown rice.  4 oz (113 g) corn.  8 oz (240 mL) milk.  8 oz (170 g) strawberries with sugar-free whipped topping. Carbohydrate calculation 1. Identify the foods that contain carbohydrates: ? Rice. ? Corn. ? Milk. ? Strawberries. 2. Calculate how many servings you have of each food: ? 2 servings rice. ? 1 serving corn. ? 1 serving milk. ? 1 serving strawberries. 3. Multiply each number of servings by 15 g: ? 2 servings rice x 15 g = 30 g. ? 1 serving corn x 15 g = 15 g. ? 1 serving milk x 15 g = 15 g. ? 1  serving strawberries x 15 g = 15 g. 4. Add together all of the amounts to find the total grams of carbohydrates eaten: ? 30 g + 15 g + 15 g + 15 g = 75 g of carbohydrates total. Summary  Carbohydrate counting is a method of keeping track of how many carbohydrates you eat.  Eating carbohydrates naturally increases the amount of sugar (glucose) in the blood.  Counting how many carbohydrates you eat helps keep your blood glucose within normal limits, which helps you manage your diabetes.  A diet and nutrition specialist (registered dietitian) can help you make a meal plan and calculate how many carbohydrates you should have at each meal and snack. This information is not intended to replace advice given to you by your health care provider. Make sure you discuss any questions you have with your health care provider. Document Revised: 10/30/2016 Document Reviewed: 09/19/2015 Elsevier Patient Education  2020 Elsevier Inc.  

## 2019-06-23 NOTE — Assessment & Plan Note (Signed)
hgba1c to be checked, minimize simple carbs. Increase exercise as tolerated. Continue current meds  

## 2019-06-23 NOTE — Progress Notes (Signed)
Patient ID: Heather [name], female    DOB: 04-03-1961  Age: 59 y.o. MRN: [id number]    Subjective:  Subjective  HPI Heather [name] presents for dm, chol and bp.  Pt with no complaints   HYPERTENSION   Blood pressure range-not checking   Chest pain- no      Dyspnea- no Lightheadedness- no   Edema- no  Other side effects - no   Medication compliance: good Low salt diet- yes    DIABETES    Blood Sugar ranges-not checking   Polyuria- no New Visual problems- no  Hypoglycemic symptoms- no  Other side effects-no Medication compliance - good Last eye exam- due    HYPERLIPIDEMIA  Medication compliance- good RUQ pain- no  Muscle aches- no Other side effects-no      Review of Systems  Constitutional: Negative for appetite change, diaphoresis, fatigue and unexpected weight change.  Eyes: Negative for pain, redness and visual disturbance.  Respiratory: Negative for cough, chest tightness, shortness of breath and wheezing.   Cardiovascular: Negative for chest pain, palpitations and leg swelling.  Endocrine: Negative for cold intolerance, heat intolerance, polydipsia, polyphagia and polyuria.  Genitourinary: Negative for difficulty urinating, dysuria and frequency.  Neurological: Negative for dizziness, light-headedness, numbness and headaches.    History Past Medical History:  Diagnosis Date  . Acute bronchitis   . Allergic rhinitis   . Allergy   . Anemia   . Anxiety   . Arthritis    knees  . Back pain   . Glaucoma   . Hyperlipidemia   . Hypertension   . Joint pain   . Leg edema   . Prediabetes     She has a past surgical history that includes Abdominal hysterectomy (1998); Breast lumpectomy; Cesarean section; and Myomectomy.   Her family history includes Alzheimer's disease in her father; Atrial fibrillation in her father and another family member; Cancer in her maternal grandfather, maternal grandmother, and paternal grandfather; Cancer (age of onset: 77)  in her father; Cancer - Colon (age of onset: 53) in her brother; Colon cancer in her brother; Colon cancer (age of onset: 21) in her cousin; Dementia in her father; Diabetes in her maternal aunt and maternal uncle; Endometrial cancer in an other family member; Heart disease in her father and mother; Hyperlipidemia in her brother, brother, father, and mother; Hypertension in her brother, brother, brother, father, and mother; Kidney disease in her maternal uncle; Pulmonary embolism in her brother; Stroke in her father.She reports that she has never smoked. She has never used smokeless tobacco. She reports current alcohol use. She reports that she does not use drugs.  Current Outpatient Medications on File Prior to Visit  Medication Sig Dispense Refill  . buPROPion (WELLBUTRIN SR) 150 MG 12 hr tablet Take 1 tablet (150 mg total) by mouth daily. 30 tablet 0  . cyclobenzaprine (FLEXERIL) 10 MG tablet TAKE 1 TABLET BY MOUTH THREE TIMES DAILY AS NEEDED FOR MUSCLE SPASM 30 tablet 0  . diclofenac sodium (VOLTAREN) 1 % GEL APPLY 4 GRAMS  TOPICALLY 4 TIMES DAILY 100 g 0  . fluticasone (FLONASE) 50 MCG/ACT nasal spray Place 2 sprays into both nostrils daily. 48 g 1  . furosemide (LASIX) 20 MG tablet Take 1 tablet (20 mg total) by mouth daily. 90 tablet 0  . Lactobacillus (PROBIOTIC ACIDOPHILUS PO) Take by mouth.    . latanoprost (XALATAN) 0.005 % ophthalmic solution     . levocetirizine (XYZAL) 5 MG tablet Take 1 tablet (5  mg total) by mouth every evening. 90 tablet 1  . lisinopril (ZESTRIL) 10 MG tablet Take 1 tablet by mouth once daily 90 tablet 0  . meloxicam (MOBIC) 15 MG tablet 1/2-1 po qd prn pain 30 tablet 0  . Multiple Vitamin (MULTIVITAMIN) tablet Take 1 tablet by mouth daily.      . pravastatin (PRAVACHOL) 40 MG tablet Take 1 tablet by mouth once daily 90 tablet 0   No current facility-administered medications on file prior to visit.     Objective:  Objective  Physical Exam Vitals and nursing  note reviewed.  Constitutional:      Appearance: She is well-developed.  HENT:     Head: Normocephalic and atraumatic.  Eyes:     Conjunctiva/sclera: Conjunctivae normal.  Neck:     Thyroid: No thyromegaly.     Vascular: No carotid bruit or JVD.  Cardiovascular:     Rate and Rhythm: Normal rate and regular rhythm.     Heart sounds: Normal heart sounds. No murmur.  Pulmonary:     Effort: Pulmonary effort is normal. No respiratory distress.     Breath sounds: Normal breath sounds. No wheezing or rales.  Chest:     Chest wall: No tenderness.  Musculoskeletal:     Cervical back: Normal range of motion and neck supple.  Neurological:     Mental Status: She is alert and oriented to person, place, and time.    BP 130/80 (BP Location: Left Arm, Patient Position: Sitting, Cuff Size: Normal)   Pulse 89   Temp 97.6 F (36.4 C) (Temporal)   Resp 18   Ht 5\' 7"  (1.702 m)   Wt 275 lb 3.2 oz (124.8 kg)   SpO2 96%   BMI 43.10 kg/m  Wt Readings from Last 3 Encounters:  06/23/19 275 lb 3.2 oz (124.8 kg)  05/12/19 268 lb (121.6 kg)  05/05/19 263 lb (119.3 kg)     Lab Results  Component Value Date   WBC 5.6 09/24/2017   HGB 12.6 09/24/2017   HCT 40.7 09/24/2017   PLT 207 09/24/2017   GLUCOSE 99 12/22/2018   CHOL 179 12/22/2018   TRIG 86.0 12/22/2018   HDL 55.30 12/22/2018   LDLDIRECT 128.0 01/03/2013   LDLCALC 106 (H) 12/22/2018   ALT 8 12/22/2018   AST 11 12/22/2018   NA 138 12/22/2018   K 4.3 12/22/2018   CL 103 12/22/2018   CREATININE 0.74 12/22/2018   BUN 15 12/22/2018   CO2 31 12/22/2018   TSH 1.530 09/24/2017   HGBA1C 6.2 12/22/2018   MICROALBUR 0.2 01/03/2013    VAS [place] LOWER EXTREMITY VENOUS REFLUX  Result Date: 05/12/2019  Lower Venous Reflux Study Indications: Swelling, Edema, and Pain. Other Indications: Patient has known neuropathy and bilateral knee problems. Risk Factors: Surgery History of previous bilateral great saphenous vein ablation and sclerotherapy.  Performing Technologist: Delorise Shiner RVT  Examination Guidelines: A complete evaluation includes B-mode imaging, spectral Doppler, color Doppler, and power Doppler as needed of all accessible portions of each vessel. Bilateral testing is considered an integral part of a complete examination. Limited examinations for reoccurring indications may be performed as noted. The reflux portion of the exam is performed with the patient in reverse Trendelenburg. Significant venous reflux is defined as ?500 ms in the superficial venous system, and >1 second in the deep venous system.  Venous Reflux Times +--------------+------+----------+------------+--------------------------------+ RIGHT         Reflux  Reflux  Diameter cmsComments  Yes     Time                                                +--------------+------+----------+------------+--------------------------------+ CFV            yes  >1 second                                              +--------------+------+----------+------------+--------------------------------+ Popliteal                        0.554                                     +--------------+------+----------+------------+--------------------------------+ GSV at SFJ                       0.554                                     +--------------+------+----------+------------+--------------------------------+ GSV prox thigh                            not visualized/previously                                                  treated                          +--------------+------+----------+------------+--------------------------------+ GSV mid calf                              not visualized/previously                                                  treated                          +--------------+------+----------+------------+--------------------------------+ SSV Pop Fossa                    0.365                                      +--------------+------+----------+------------+--------------------------------+ SSV prox calf                    0.324                                     +--------------+------+----------+------------+--------------------------------+ SSV mid calf  0.228                                     +--------------+------+----------+------------+--------------------------------+  +--------------+------+----------+------------+--------------------------------+ LEFT          Reflux  Reflux  Diameter cmsComments                                        Yes     Time                                                +--------------+------+----------+------------+--------------------------------+ GSV at SFJ     yes   >500 ms     0.841                                     +--------------+------+----------+------------+--------------------------------+ GSV prox thigh                   0.451                                     +--------------+------+----------+------------+--------------------------------+ GSV mid thigh                    0.353                                     +--------------+------+----------+------------+--------------------------------+ GSV dist thigh                            not visualized/previously                                                  treated                          +--------------+------+----------+------------+--------------------------------+ GSV at knee                      0.185                                     +--------------+------+----------+------------+--------------------------------+ GSV prox calf                    0.220                                     +--------------+------+----------+------------+--------------------------------+ GSV mid calf                     0.142                                      +--------------+------+----------+------------+--------------------------------+  SSV Pop Fossa                    0.301                                     +--------------+------+----------+------------+--------------------------------+ SSV prox calf                    0.310                                     +--------------+------+----------+------------+--------------------------------+ SSV mid calf                     0.274                                     +--------------+------+----------+------------+--------------------------------+   Summary: Right: No evidence of DVT, SVT, or Baker's cyst. The sapheno-femoral junction is competent. The great saphenous vein is not identified in the thigh or proximal calf consistent with previous ablation. No evidence of SSV reflux. The common femoral vein demonstrates reflux. . Left: No evidence of DVT, SVT, or Baker's cyst. The sapheno-femoral junction is incompetent. No evidence of GSV reflux The distal thigh segment is not visualized consistent with previous ablation. No evidence of SSV reflux. No evidence of deep venous reflux. .  *See table(s) above for measurements and observations. Electronically signed by Deitra Mayo MD on 05/12/2019 at 4:30:06 PM.    Final      Assessment & Plan:  Plan  I have discontinued Heather [name] "Gwen"'s amoxicillin-clavulanate. I am also having her maintain her multivitamin, Lactobacillus (PROBIOTIC ACIDOPHILUS PO), latanoprost, meloxicam, fluticasone, buPROPion, levocetirizine, cyclobenzaprine, diclofenac sodium, lisinopril, pravastatin, and furosemide.  No orders of the defined types were placed in this encounter.   Problem List Items Addressed This Visit      Unprioritized   Diabetes mellitus ([place])    hgba1c to be checked , minimize simple carbs. Increase exercise as tolerated. Continue current meds      Essential hypertension    Well controlled, no changes to meds. Encouraged heart  healthy diet such as the DASH diet and exercise as tolerated.       Relevant Orders   Lipid panel   Hemoglobin A1c   Comprehensive metabolic panel   Microalbumin / creatinine urine ratio   Hyperlipidemia    Tolerating statin, encouraged heart healthy diet, avoid trans fats, minimize simple carbs and saturated fats. Increase exercise as tolerated       Other Visit Diagnoses    Diet-controlled diabetes mellitus ([place])    -  Primary   Relevant Orders   Lipid panel   Hemoglobin A1c   Comprehensive metabolic panel   Microalbumin / creatinine urine ratio   Hyperlipidemia associated with type 2 diabetes mellitus ([place])       Relevant Orders   Lipid panel   Hemoglobin A1c   Comprehensive metabolic panel   Microalbumin / creatinine urine ratio      Follow-up: Return in about 6 months (around 12/24/2019), or if symptoms worsen or fail to improve, for annual exam, fasting.  Ann Held, DO

## 2019-06-23 NOTE — Assessment & Plan Note (Signed)
Well controlled, no changes to meds. Encouraged heart healthy diet such as the DASH diet and exercise as tolerated.  °

## 2019-06-23 NOTE — Assessment & Plan Note (Signed)
Tolerating statin, encouraged heart healthy diet, avoid trans fats, minimize simple carbs and saturated fats. Increase exercise as tolerated 

## 2019-06-24 ENCOUNTER — Ambulatory Visit: Payer: 59 | Attending: Internal Medicine

## 2019-06-24 DIAGNOSIS — Z23 Encounter for immunization: Secondary | ICD-10-CM | POA: Insufficient documentation

## 2019-06-24 NOTE — Progress Notes (Signed)
   Covid-19 Vaccination Clinic  Name:  DERRION KOTOWSKI    MRN: [id number] DOB: [birth year]  06/24/2019  Ms. Cotler was observed post Covid-19 immunization for 15 minutes without incident. She was provided with Vaccine Information Sheet and instruction to access the V-Safe system.   Ms. Tackitt was instructed to call 911 with any severe reactions post vaccine: Marland Kitchen Difficulty breathing  . Swelling of face and throat  . A fast heartbeat  . A bad rash all over body  . Dizziness and weakness

## 2019-07-21 ENCOUNTER — Other Ambulatory Visit: Payer: Self-pay | Admitting: Family Medicine

## 2019-07-21 DIAGNOSIS — I10 Essential (primary) hypertension: Secondary | ICD-10-CM

## 2019-07-26 ENCOUNTER — Ambulatory Visit: Payer: 59 | Attending: Internal Medicine

## 2019-07-26 DIAGNOSIS — Z23 Encounter for immunization: Secondary | ICD-10-CM

## 2019-07-26 NOTE — Progress Notes (Signed)
   Covid-19 Vaccination Clinic  Name:  [name]    MRN: [id number] DOB: [birth year]  07/26/2019  [name] was observed post Covid-19 immunization for 15 minutes without incident. She was provided with Vaccine Information Sheet and instruction to access the V-Safe system.   [name] was instructed to call 911 with any severe reactions post vaccine: Marland Kitchen Difficulty breathing  . Swelling of face and throat  . A fast heartbeat  . A bad rash all over body  . Dizziness and weakness   Immunizations Administered    Name Date Dose VIS Date Route   Pfizer COVID-19 Vaccine 07/26/2019  8:07 AM 0.3 mL 04/01/2019 Intramuscular   Manufacturer: [employer], [employer]   Lot: [id number]   [place]: [id number]

## 2019-08-25 ENCOUNTER — Other Ambulatory Visit: Payer: Self-pay | Admitting: Family Medicine

## 2019-08-25 DIAGNOSIS — M62830 Muscle spasm of back: Secondary | ICD-10-CM

## 2019-09-28 ENCOUNTER — Encounter: Payer: Self-pay | Admitting: Gastroenterology

## 2019-10-11 ENCOUNTER — Other Ambulatory Visit: Payer: Self-pay | Admitting: Family Medicine

## 2019-10-11 DIAGNOSIS — I10 Essential (primary) hypertension: Secondary | ICD-10-CM

## 2019-10-11 DIAGNOSIS — R600 Localized edema: Secondary | ICD-10-CM

## 2019-11-02 LAB — HM MAMMOGRAPHY

## 2019-12-18 ENCOUNTER — Encounter: Payer: Self-pay | Admitting: Family Medicine

## 2019-12-19 NOTE — Telephone Encounter (Signed)
The recommendation is to wait 6-8 months but it will be available 9/93 for general public  I have had patients get it at [facility] or schedule at [email or web address]  I have several that had their last one in April and did receive their third shot

## 2019-12-29 ENCOUNTER — Encounter: Payer: Self-pay | Admitting: Gastroenterology

## 2020-01-02 ENCOUNTER — Ambulatory Visit (INDEPENDENT_AMBULATORY_CARE_PROVIDER_SITE_OTHER): Payer: 59 | Admitting: Family Medicine

## 2020-01-02 ENCOUNTER — Other Ambulatory Visit: Payer: Self-pay

## 2020-01-02 ENCOUNTER — Encounter: Payer: Self-pay | Admitting: Family Medicine

## 2020-01-02 VITALS — BP 120/82 | HR 69 | Temp 98.3°F | Resp 18 | Ht 67.0 in | Wt 277.8 lb

## 2020-01-02 DIAGNOSIS — I10 Essential (primary) hypertension: Secondary | ICD-10-CM

## 2020-01-02 DIAGNOSIS — E785 Hyperlipidemia, unspecified: Secondary | ICD-10-CM | POA: Diagnosis not present

## 2020-01-02 DIAGNOSIS — Z0000 Encounter for general adult medical examination without abnormal findings: Secondary | ICD-10-CM

## 2020-01-02 DIAGNOSIS — Z23 Encounter for immunization: Secondary | ICD-10-CM

## 2020-01-02 NOTE — Patient Instructions (Signed)

## 2020-01-02 NOTE — Progress Notes (Signed)
Subjective:     Heather [name] is a 59 y.o. female and is here for a comprehensive physical exam. The patient reports no new problems ----- takes flexeril occassionally for back pain. She is also f/u on bp and chol Social History   Socioeconomic History  . Marital status: Single    Spouse name: Not on file  . Number of children: Not on file  . Years of education: 29  . Highest education level: Not on file  Occupational History  . Occupation: Counsellor lab  Tobacco Use  . Smoking status: Never Smoker  . Smokeless tobacco: Never Used  Substance and Sexual Activity  . Alcohol use: Yes    Alcohol/week: 0.0 standard drinks    Comment: occ  . Drug use: No  . Sexual activity: Yes  Other Topics Concern  . Not on file  Social History Narrative   Exercise-- no   Social Determinants of Health   Financial Resource Strain:   . Difficulty of Paying Living Expenses: Not on file  Food Insecurity:   . Worried About Charity fundraiser in the Last Year: Not on file  . Ran Out of Food in the Last Year: Not on file  Transportation Needs:   . Lack of Transportation (Medical): Not on file  . Lack of Transportation (Non-Medical): Not on file  Physical Activity:   . Days of Exercise per Week: Not on file  . Minutes of Exercise per Session: Not on file  Stress:   . Feeling of Stress : Not on file  Social Connections:   . Frequency of Communication with Friends and Family: Not on file  . Frequency of Social Gatherings with Friends and Family: Not on file  . Attends Religious Services: Not on file  . Active Member of Clubs or Organizations: Not on file  . Attends Archivist Meetings: Not on file  . Marital Status: Not on file  Intimate Partner Violence:   . Fear of Current or Ex-Partner: Not on file  . Emotionally Abused: Not on file  . Physically Abused: Not on file  . Sexually Abused: Not on file   Health Maintenance  Topic Date Due  . INFLUENZA  VACCINE  11/20/2019  . COLONOSCOPY  12/07/2019  . FOOT EXAM  12/21/2019  . HEMOGLOBIN A1C  12/24/2019  . OPHTHALMOLOGY EXAM  06/19/2020  . MAMMOGRAM  11/01/2020  . TETANUS/TDAP  12/20/2028  . PNEUMOCOCCAL POLYSACCHARIDE VACCINE AGE 85-64 HIGH RISK  Completed  . COVID-19 Vaccine  Completed  . Hepatitis C Screening  Completed  . HIV Screening  Completed    The following portions of the patient's history were reviewed and updated as appropriate:  She  has a past medical history of Acute bronchitis, Allergic rhinitis, Allergy, Anemia, Anxiety, Arthritis, Back pain, Glaucoma, Hyperlipidemia, Hypertension, Joint pain, Leg edema, and Prediabetes. She does not have any pertinent problems on file. She  has a past surgical history that includes Abdominal hysterectomy (1998); Breast lumpectomy; Cesarean section; and Myomectomy. Her family history includes Alzheimer's disease in her father; Atrial fibrillation in her father and another family member; Cancer in her maternal grandfather; Cancer - Colon (age of onset: 72) in her brother; Colon cancer (age of onset: 41) in her cousin; Dementia in her father; Diabetes in her maternal aunt and maternal uncle; Endometrial cancer in an other family member; Heart disease in her father and mother; Hyperlipidemia in her brother, brother, father, and mother; Hypertension in her brother, brother,  brother, father, and mother; Kidney disease in her maternal uncle; Prostate cancer (age of onset: 47) in her father; Pulmonary embolism in her brother; Stroke in her father; Uterine cancer in her maternal grandmother. She  reports that she has never smoked. She has never used smokeless tobacco. She reports current alcohol use. She reports that she does not use drugs. She has a current medication list which includes the following prescription(s): bupropion, cyclobenzaprine, diclofenac sodium, fluticasone, furosemide, lactobacillus, latanoprost, levocetirizine, lisinopril,  meloxicam, multivitamin, and pravastatin. Current Outpatient Medications on File Prior to Visit  Medication Sig Dispense Refill  . buPROPion (WELLBUTRIN SR) 150 MG 12 hr tablet Take 1 tablet (150 mg total) by mouth daily. 30 tablet 0  . cyclobenzaprine (FLEXERIL) 10 MG tablet Take 1 tablet by mouth three times daily as needed for muscle spasm 30 tablet 0  . diclofenac sodium (VOLTAREN) 1 % GEL APPLY 4 GRAMS  TOPICALLY 4 TIMES DAILY 100 g 0  . fluticasone (FLONASE) 50 MCG/ACT nasal spray Place 2 sprays into both nostrils daily. 48 g 1  . furosemide (LASIX) 20 MG tablet Take 1 tablet (20 mg total) by mouth daily. 90 tablet 1  . Lactobacillus (PROBIOTIC ACIDOPHILUS PO) Take by mouth.    . latanoprost (XALATAN) 0.005 % ophthalmic solution     . levocetirizine (XYZAL) 5 MG tablet Take 1 tablet (5 mg total) by mouth every evening. 90 tablet 1  . lisinopril (ZESTRIL) 10 MG tablet Take 1 tablet (10 mg total) by mouth daily. 90 tablet 1  . meloxicam (MOBIC) 15 MG tablet 1/2-1 po qd prn pain 30 tablet 0  . Multiple Vitamin (MULTIVITAMIN) tablet Take 1 tablet by mouth daily.      . pravastatin (PRAVACHOL) 40 MG tablet Take 1 tablet by mouth once daily 90 tablet 1   No current facility-administered medications on file prior to visit.   She is allergic to oseltamivir phosphate..  Review of Systems Review of Systems  Constitutional: Negative for activity change, appetite change and fatigue.  HENT: Negative for hearing loss, congestion, tinnitus and ear discharge.  dentist q68m Eyes: Negative for visual disturbance (see optho q1y -- vision corrected to 20/20 with glasses).  Respiratory: Negative for cough, chest tightness and shortness of breath.   Cardiovascular: Negative for chest pain, palpitations and leg swelling.  Gastrointestinal: Negative for abdominal pain, diarrhea, constipation and abdominal distention.  Genitourinary: Negative for urgency, frequency, decreased urine volume and difficulty  urinating.  Musculoskeletal: Negative for back pain, arthralgias and gait problem.  Skin: Negative for color change, pallor and rash.  Neurological: Negative for dizziness, light-headedness, numbness and headaches.  Hematological: Negative for adenopathy. Does not bruise/bleed easily.  Psychiatric/Behavioral: Negative for suicidal ideas, confusion, sleep disturbance, self-injury, dysphoric mood, decreased concentration and agitation.       Objective:    BP 120/82 (BP Location: Left Arm, Patient Position: Sitting, Cuff Size: Large)   Pulse 69   Temp 98.3 F (36.8 C) (Oral)   Resp 18   Ht 5\' 7"  (1.702 m)   Wt 277 lb 12.8 oz (126 kg)   SpO2 96%   BMI 43.51 kg/m  General appearance: alert, cooperative, appears stated age and no distress Head: Normocephalic, without obvious abnormality, atraumatic Eyes: negative findings: lids and lashes normal, conjunctivae and sclerae normal and pupils equal, round, reactive to light and accomodation Ears: normal TM's and external ear canals both ears Neck: no adenopathy, no carotid bruit, no JVD, supple, symmetrical, trachea midline and thyroid not enlarged, symmetric,  no tenderness/mass/nodules Back: symmetric, no curvature. ROM normal. No CVA tenderness. Lungs: clear to auscultation bilaterally Breasts: normal appearance, no masses or tenderness Heart: regular rate and rhythm, S1, S2 normal, no murmur, click, rub or gallop Abdomen: soft, non-tender; bowel sounds normal; no masses,  no organomegaly Pelvic: not indicated; status post hysterectomy, negative ROS Extremities: extremities normal, atraumatic, no cyanosis or edema Pulses: 2+ and symmetric Skin: Skin color, texture, turgor normal. No rashes or lesions Lymph nodes: Cervical, supraclavicular, and axillary nodes normal. Neurologic: Alert and oriented X 3, normal strength and tone. Normal symmetric reflexes. Normal coordination and gait    Assessment:    Healthy female exam.      Plan:     ghm utd Check labs See After Visit Summary for Counseling Recommendations    1. Need for influenza vaccination  - Flu Vaccine QUAD 36+ mos IM  2. Preventative health care See above  - CBC with Differential/Platelet - Lipid panel - TSH - Comprehensive metabolic panel  3. Essential hypertension Well controlled, no changes to meds. Encouraged heart healthy diet such as the DASH diet and exercise as tolerated.   - CBC with Differential/Platelet - Lipid panel - TSH - Comprehensive metabolic panel  4. Dyslipidemia Tolerating statin, encouraged heart healthy diet, avoid trans fats, minimize simple carbs and saturated fats. Increase exercise as tolerated - Lipid panel - Comprehensive metabolic panel

## 2020-01-03 ENCOUNTER — Encounter: Payer: Self-pay | Admitting: Family Medicine

## 2020-01-03 LAB — COMPREHENSIVE METABOLIC PANEL
AG Ratio: 1.6 (calc) (ref 1.0–2.5)
ALT: 10 U/L (ref 6–29)
AST: 13 U/L (ref 10–35)
Albumin: 4.3 g/dL (ref 3.6–5.1)
Alkaline phosphatase (APISO): 56 U/L (ref 37–153)
BUN: 15 mg/dL (ref 7–25)
CO2: 29 mmol/L (ref 20–32)
Calcium: 9.6 mg/dL (ref 8.6–10.4)
Chloride: 102 mmol/L (ref 98–110)
Creat: 0.9 mg/dL (ref 0.50–0.99)
Globulin: 2.7 g/dL (calc) (ref 1.9–3.7)
Glucose, Bld: 88 mg/dL (ref 65–99)
Potassium: 4.4 mmol/L (ref 3.5–5.3)
Sodium: 141 mmol/L (ref 135–146)
Total Bilirubin: 0.5 mg/dL (ref 0.2–1.2)
Total Protein: 7 g/dL (ref 6.1–8.1)

## 2020-01-03 LAB — CBC WITH DIFFERENTIAL/PLATELET
Absolute Monocytes: 451 cells/uL (ref 200–950)
Basophils Absolute: 72 cells/uL (ref 0–200)
Basophils Relative: 1.3 %
Eosinophils Absolute: 88 cells/uL (ref 15–500)
Eosinophils Relative: 1.6 %
HCT: 41.2 % (ref 35.0–45.0)
Hemoglobin: 13 g/dL (ref 11.7–15.5)
Lymphs Abs: 2552 cells/uL (ref 850–3900)
MCH: 24.4 pg — ABNORMAL LOW (ref 27.0–33.0)
MCHC: 31.6 g/dL — ABNORMAL LOW (ref 32.0–36.0)
MCV: 77.4 fL — ABNORMAL LOW (ref 80.0–100.0)
MPV: 13.1 fL — ABNORMAL HIGH (ref 7.5–12.5)
Monocytes Relative: 8.2 %
Neutro Abs: 2338 cells/uL (ref 1500–7800)
Neutrophils Relative %: 42.5 %
Platelets: 191 10*3/uL (ref 140–400)
RBC: 5.32 10*6/uL — ABNORMAL HIGH (ref 3.80–5.10)
RDW: 15 % (ref 11.0–15.0)
Total Lymphocyte: 46.4 %
WBC: 5.5 10*3/uL (ref 3.8–10.8)

## 2020-01-03 LAB — TSH: TSH: 1.23 mIU/L (ref 0.40–4.50)

## 2020-01-03 LAB — LIPID PANEL
Cholesterol: 187 mg/dL (ref <200)
HDL: 66 mg/dL (ref >=50)
LDL Cholesterol (Calc): 106 mg/dL (calc) — ABNORMAL HIGH
Non-HDL Cholesterol (Calc): 121 mg/dL (calc) (ref <130)
Total CHOL/HDL Ratio: 2.8 (calc) (ref <5.0)
Triglycerides: 66 mg/dL (ref <150)

## 2020-01-04 ENCOUNTER — Other Ambulatory Visit: Payer: Self-pay | Admitting: Family Medicine

## 2020-01-04 DIAGNOSIS — R7989 Other specified abnormal findings of blood chemistry: Secondary | ICD-10-CM

## 2020-01-04 NOTE — Telephone Encounter (Signed)
Not cancer but we do need to add a test --- and may refer to hematology --  she needs a lab appointment

## 2020-01-04 NOTE — Telephone Encounter (Signed)
See labs 

## 2020-01-05 ENCOUNTER — Other Ambulatory Visit: Payer: Self-pay

## 2020-01-05 ENCOUNTER — Other Ambulatory Visit: Payer: 59

## 2020-01-05 DIAGNOSIS — R7989 Other specified abnormal findings of blood chemistry: Secondary | ICD-10-CM

## 2020-01-06 ENCOUNTER — Encounter: Payer: Self-pay | Admitting: Family Medicine

## 2020-01-09 LAB — CBC WITH DIFFERENTIAL/PLATELET
Absolute Monocytes: 611 cells/uL (ref 200–950)
Basophils Absolute: 71 cells/uL (ref 0–200)
Basophils Relative: 1 %
Eosinophils Absolute: 121 cells/uL (ref 15–500)
Eosinophils Relative: 1.7 %
HCT: 41.5 % (ref 35.0–45.0)
Hemoglobin: 13.2 g/dL (ref 11.7–15.5)
Lymphs Abs: 3323 cells/uL (ref 850–3900)
MCH: 24.4 pg — ABNORMAL LOW (ref 27.0–33.0)
MCHC: 31.8 g/dL — ABNORMAL LOW (ref 32.0–36.0)
MCV: 76.9 fL — ABNORMAL LOW (ref 80.0–100.0)
MPV: 13.1 fL — ABNORMAL HIGH (ref 7.5–12.5)
Monocytes Relative: 8.6 %
Neutro Abs: 2975 cells/uL (ref 1500–7800)
Neutrophils Relative %: 41.9 %
Platelets: 196 10*3/uL (ref 140–400)
RBC: 5.4 10*6/uL — ABNORMAL HIGH (ref 3.80–5.10)
RDW: 14.5 % (ref 11.0–15.0)
Total Lymphocyte: 46.8 %
WBC: 7.1 10*3/uL (ref 3.8–10.8)

## 2020-01-09 LAB — TEST AUTHORIZATION

## 2020-01-09 LAB — PATHOLOGIST SMEAR REVIEW

## 2020-01-17 ENCOUNTER — Other Ambulatory Visit: Payer: Self-pay

## 2020-01-17 ENCOUNTER — Encounter: Payer: Self-pay | Admitting: Family Medicine

## 2020-01-17 ENCOUNTER — Ambulatory Visit (INDEPENDENT_AMBULATORY_CARE_PROVIDER_SITE_OTHER): Payer: 59 | Admitting: Family Medicine

## 2020-01-17 ENCOUNTER — Ambulatory Visit (HOSPITAL_BASED_OUTPATIENT_CLINIC_OR_DEPARTMENT_OTHER)
Admission: RE | Admit: 2020-01-17 | Discharge: 2020-01-17 | Disposition: A | Discharge status: Home / Self Care | Payer: 59 | Source: Ambulatory Visit | Attending: Family Medicine | Admitting: Family Medicine

## 2020-01-17 VITALS — BP 110/78 | HR 82 | Temp 98.2°F | Resp 18 | Ht 67.0 in | Wt 282.4 lb

## 2020-01-17 DIAGNOSIS — R7989 Other specified abnormal findings of blood chemistry: Secondary | ICD-10-CM | POA: Diagnosis not present

## 2020-01-17 DIAGNOSIS — S0990XA Unspecified injury of head, initial encounter: Secondary | ICD-10-CM | POA: Insufficient documentation

## 2020-01-17 NOTE — Assessment & Plan Note (Signed)
With confusion, dizziness and headache Check CT May be concussion --- r/o bleed If no improvement in symptoms in next few weeks or if they worsen----  Refer to neuro , mri brain

## 2020-01-17 NOTE — Assessment & Plan Note (Signed)
Recheck cbc with ibc/ ferritin  If not iron def anemia ---- ? Thalassemia-- refer to hematology

## 2020-01-17 NOTE — Progress Notes (Signed)
Patient ID: Heather [name], female    DOB: 05-Apr-1961  Age: 59 y.o. MRN: [id number]    Subjective:  Subjective  HPI Heather [name] presents for headaches , dizziness and confusion x 1 week after hitting her head at her nephews house.  She bent down and when she got up she hit the R side of her forehead on the wood cabinet.  Also earlier this week she could not remember her password for her computer at work.   Tylenol helps some   Review of Systems  Constitutional: Negative for appetite change, diaphoresis, fatigue and unexpected weight change.  Eyes: Negative for pain, redness and visual disturbance.  Respiratory: Negative for cough, chest tightness, shortness of breath and wheezing.   Cardiovascular: Negative for chest pain, palpitations and leg swelling.  Endocrine: Negative for cold intolerance, heat intolerance, polydipsia, polyphagia and polyuria.  Genitourinary: Negative for difficulty urinating, dysuria and frequency.  Neurological: Positive for dizziness and headaches. Negative for syncope, speech difficulty, light-headedness and numbness.  Psychiatric/Behavioral: Positive for confusion and decreased concentration. Negative for behavioral problems, dysphoric mood, hallucinations, self-injury, sleep disturbance and suicidal ideas. The patient is not nervous/anxious and is not hyperactive.     History Past Medical History:  Diagnosis Date  . Acute bronchitis   . Allergic rhinitis   . Allergy   . Anemia   . Anxiety   . Arthritis    knees  . Back pain   . Glaucoma   . Hyperlipidemia   . Hypertension   . Joint pain   . Leg edema   . Prediabetes     She has a past surgical history that includes Abdominal hysterectomy (1998); Breast lumpectomy; Cesarean section; and Myomectomy.   Her family history includes Alzheimer's disease in her father; Atrial fibrillation in her father and another family member; Cancer in her maternal grandfather; Cancer - Colon (age of onset:  63) in her brother; Colon cancer (age of onset: 23) in her cousin; Dementia in her father; Diabetes in her maternal aunt and maternal uncle; Endometrial cancer in an other family member; Heart disease in her father and mother; Hyperlipidemia in her brother, brother, father, and mother; Hypertension in her brother, brother, brother, father, and mother; Kidney disease in her maternal uncle; Prostate cancer (age of onset: 67) in her father; Pulmonary embolism in her brother; Stroke in her father; Uterine cancer in her maternal grandmother.She reports that she has never smoked. She has never used smokeless tobacco. She reports current alcohol use. She reports that she does not use drugs.  Current Outpatient Medications on File Prior to Visit  Medication Sig Dispense Refill  . buPROPion (WELLBUTRIN SR) 150 MG 12 hr tablet Take 1 tablet (150 mg total) by mouth daily. 30 tablet 0  . cyclobenzaprine (FLEXERIL) 10 MG tablet Take 1 tablet by mouth three times daily as needed for muscle spasm 30 tablet 0  . diclofenac sodium (VOLTAREN) 1 % GEL APPLY 4 GRAMS  TOPICALLY 4 TIMES DAILY 100 g 0  . fluticasone (FLONASE) 50 MCG/ACT nasal spray Place 2 sprays into both nostrils daily. 48 g 1  . furosemide (LASIX) 20 MG tablet Take 1 tablet (20 mg total) by mouth daily. 90 tablet 1  . Lactobacillus (PROBIOTIC ACIDOPHILUS PO) Take by mouth.    . latanoprost (XALATAN) 0.005 % ophthalmic solution     . levocetirizine (XYZAL) 5 MG tablet Take 1 tablet (5 mg total) by mouth every evening. 90 tablet 1  . lisinopril (ZESTRIL) 10 MG  tablet Take 1 tablet (10 mg total) by mouth daily. 90 tablet 1  . meloxicam (MOBIC) 15 MG tablet 1/2-1 po qd prn pain 30 tablet 0  . Multiple Vitamin (MULTIVITAMIN) tablet Take 1 tablet by mouth daily.      . pravastatin (PRAVACHOL) 40 MG tablet Take 1 tablet by mouth once daily 90 tablet 1   No current facility-administered medications on file prior to visit.     Objective:  Objective    Physical Exam Vitals and nursing note reviewed.  Constitutional:      Appearance: She is well-developed.  HENT:     Head: Normocephalic. No raccoon eyes, Battle's sign, contusion, masses, right periorbital erythema or left periorbital erythema.   Eyes:     General: Lids are normal. Vision grossly intact. No allergic shiner.    Extraocular Movements: Extraocular movements intact.     Conjunctiva/sclera: Conjunctivae normal.     Pupils: Pupils are equal, round, and reactive to light.  Neck:     Thyroid: No thyromegaly.     Vascular: No carotid bruit or JVD.  Cardiovascular:     Rate and Rhythm: Normal rate and regular rhythm.     Heart sounds: Normal heart sounds. No murmur heard.   Pulmonary:     Effort: Pulmonary effort is normal. No respiratory distress.     Breath sounds: Normal breath sounds. No wheezing or rales.  Chest:     Chest wall: No tenderness.  Musculoskeletal:     Cervical back: Normal range of motion and neck supple.  Neurological:     Mental Status: She is alert and oriented to person, place, and time.    BP 110/78 (BP Location: Right Arm, Patient Position: Sitting)   Pulse 82   Temp 98.2 F (36.8 C) (Oral)   Resp 18   Ht 5\' 7"  (1.702 m)   Wt 282 lb 6.4 oz (128.1 kg)   SpO2 97%   BMI 44.23 kg/m  Wt Readings from Last 3 Encounters:  01/17/20 282 lb 6.4 oz (128.1 kg)  01/02/20 277 lb 12.8 oz (126 kg)  06/23/19 275 lb 3.2 oz (124.8 kg)     Lab Results  Component Value Date   WBC 7.1 01/05/2020   HGB 13.2 01/05/2020   HCT 41.5 01/05/2020   PLT 196 01/05/2020   GLUCOSE 88 01/02/2020   CHOL 187 01/02/2020   TRIG 66 01/02/2020   HDL 66 01/02/2020   LDLDIRECT 128.0 01/03/2013   LDLCALC 106 (H) 01/02/2020   ALT 10 01/02/2020   AST 13 01/02/2020   NA 141 01/02/2020   K 4.4 01/02/2020   CL 102 01/02/2020   CREATININE 0.90 01/02/2020   BUN 15 01/02/2020   CO2 29 01/02/2020   TSH 1.23 01/02/2020   HGBA1C 5.9 06/23/2019   MICROALBUR <0.7  06/23/2019    VAS [place] LOWER EXTREMITY VENOUS REFLUX  Result Date: 05/12/2019  Lower Venous Reflux Study Indications: Swelling, Edema, and Pain. Other Indications: Patient has known neuropathy and bilateral knee problems. Risk Factors: Surgery History of previous bilateral great saphenous vein ablation and sclerotherapy. Performing Technologist: Delorise Shiner RVT  Examination Guidelines: A complete evaluation includes B-mode imaging, spectral Doppler, color Doppler, and power Doppler as needed of all accessible portions of each vessel. Bilateral testing is considered an integral part of a complete examination. Limited examinations for reoccurring indications may be performed as noted. The reflux portion of the exam is performed with the patient in reverse Trendelenburg. Significant venous reflux is defined  as ?500 ms in the superficial venous system, and >1 second in the deep venous system.  Venous Reflux Times +--------------+------+----------+------------+--------------------------------+ RIGHT         Reflux  Reflux  Diameter cmsComments                                        Yes     Time                                                +--------------+------+----------+------------+--------------------------------+ CFV            yes  >1 second                                              +--------------+------+----------+------------+--------------------------------+ Popliteal                        0.554                                     +--------------+------+----------+------------+--------------------------------+ GSV at SFJ                       0.554                                     +--------------+------+----------+------------+--------------------------------+ GSV prox thigh                            not visualized/previously                                                  treated                           +--------------+------+----------+------------+--------------------------------+ GSV mid calf                              not visualized/previously                                                  treated                          +--------------+------+----------+------------+--------------------------------+ SSV Pop Fossa                    0.365                                     +--------------+------+----------+------------+--------------------------------+ SSV prox calf  0.324                                     +--------------+------+----------+------------+--------------------------------+ SSV mid calf                     0.228                                     +--------------+------+----------+------------+--------------------------------+  +--------------+------+----------+------------+--------------------------------+ LEFT          Reflux  Reflux  Diameter cmsComments                                        Yes     Time                                                +--------------+------+----------+------------+--------------------------------+ GSV at SFJ     yes   >500 ms     0.841                                     +--------------+------+----------+------------+--------------------------------+ GSV prox thigh                   0.451                                     +--------------+------+----------+------------+--------------------------------+ GSV mid thigh                    0.353                                     +--------------+------+----------+------------+--------------------------------+ GSV dist thigh                            not visualized/previously                                                  treated                          +--------------+------+----------+------------+--------------------------------+ GSV at knee                      0.185                                      +--------------+------+----------+------------+--------------------------------+ GSV prox calf                    0.220                                     +--------------+------+----------+------------+--------------------------------+  GSV mid calf                     0.142                                     +--------------+------+----------+------------+--------------------------------+ SSV Pop Fossa                    0.301                                     +--------------+------+----------+------------+--------------------------------+ SSV prox calf                    0.310                                     +--------------+------+----------+------------+--------------------------------+ SSV mid calf                     0.274                                     +--------------+------+----------+------------+--------------------------------+   Summary: Right: No evidence of DVT, SVT, or Baker's cyst. The sapheno-femoral junction is competent. The great saphenous vein is not identified in the thigh or proximal calf consistent with previous ablation. No evidence of SSV reflux. The common femoral vein demonstrates reflux. . Left: No evidence of DVT, SVT, or Baker's cyst. The sapheno-femoral junction is incompetent. No evidence of GSV reflux The distal thigh segment is not visualized consistent with previous ablation. No evidence of SSV reflux. No evidence of deep venous reflux. .  *See table(s) above for measurements and observations. Electronically signed by Deitra Mayo MD on 05/12/2019 at 4:30:06 PM.    Final      Assessment & Plan:  Plan  I am having Heather Cecille Po "Gwen" maintain her multivitamin, Lactobacillus (PROBIOTIC ACIDOPHILUS PO), latanoprost, meloxicam, fluticasone, buPROPion, levocetirizine, diclofenac sodium, cyclobenzaprine, pravastatin, furosemide, and lisinopril.  No orders of the defined types were placed in this encounter.   Problem List  Items Addressed This Visit      Unprioritized   Abnormal CBC    Recheck cbc with ibc/ ferritin  If not iron def anemia ---- ? Thalassemia-- refer to hematology      Relevant Orders   CBC with Differential/Platelet   Iron, TIBC and Ferritin Panel   Head trauma - Primary    With confusion, dizziness and headache Check CT May be concussion --- r/o bleed If no improvement in symptoms in next few weeks or if they worsen----  Refer to neuro , mri brain      Relevant Orders   CT Head Wo Contrast (Completed)      Follow-up: Return if symptoms worsen or fail to improve.  Ann Held, DO

## 2020-01-17 NOTE — Patient Instructions (Signed)
[facility] (pp. [phone]). [address], [place]. Elsevier."> Neurosurgery, 80(1), 6-15. Retrieved on October 10, 2018.[email or web address]"> Primary Care (5th ed., pp. 218-221). [place], [place]: Elsevier."> Brain Injury, 29(6), [phone]. [email or web address]"> Rosen's Emergency Medicine: Concepts and Clinical Practice (9th ed., pp. 301-329). [place], [place]: Elsevier.">  Head Injury, Adult There are many types of head injuries. Head injuries can be as minor as a bump, or they can be a serious medical issue. More severe head injuries include:  A jarring injury to the brain (concussion).  A bruise (contusion) of the brain. This means there is bleeding in the brain that can cause swelling.  A cracked skull (skull fracture).  Bleeding in the brain that collects, clots, and forms a bump (hematoma). After a head injury, most problems occur within the first 24 hours, but side effects may occur up to 7-10 days after the injury. It is important to watch your condition for any changes. You may need to be observed in the emergency department or urgent care, or you may be admitted to the hospital. What are the causes? There are many possible causes of a head injury. A serious head injury may be caused by a car accident, bicycle or motorcycle accidents, sports injuries, and falls. What are the symptoms? Symptoms of a head injury include a contusion, bump, or bleeding at the site of the injury. Other physical symptoms may include:  Headache.  Nausea or vomiting.  Dizziness.  Feeling tired.  Being uncomfortable around bright lights or loud noises.  Seizures.  Trouble being awakened.  Fainting. Mental or emotional symptoms may include:  Irritability.  Confusion and memory problems.  Poor attention and concentration.  Changes in eating or sleeping habits.  Anxiety or depression. How is this diagnosed? This condition can  usually be diagnosed based on your symptoms, a description of the injury, and a physical exam. You may also have imaging tests done, such as a CT scan or MRI. How is this treated? Treatment for this condition depends on the severity and type of injury you have. The main goal of treatment is to prevent complications and to allow the brain time to heal. Mild head injury If you have a mild head injury, you may be sent home and treatment may include:  Observation. A responsible adult should stay with you for 24 hours after your injury and check on you often.  Physical rest.  Brain rest.  Pain medicines. Severe head injury If you have a severe head injury, treatment may include:  Close observation. This includes hospitalization with frequent physical exams.  Medicines to relieve pain, prevent seizures, and decrease brain swelling.  Breathing support. This may include using a ventilator.  Treatments to manage the swelling inside the brain.  Brain surgery. This may be needed to: ? Remove a blood clot. ? Stop the bleeding. ? Remove a part of the skull to allow room for the brain to swell. Follow these instructions at home: Activity  Rest and avoid activities that are physically hard or tiring.  Make sure you get enough sleep.  Limit activities that require a lot of thought or attention, such as: ? Watching TV. ? Playing memory games and puzzles. ? Job-related work or homework. ? Working on [employer], and texting.  Avoid activities that could cause another head injury, such as playing sports, until your health care provider approves. Having another head injury, especially before the first one has healed, can be dangerous.  Ask your health care  provider when it is safe for you to return to your regular activities, including work or school. Ask your health care provider for a step-by-step plan for gradually returning to activities.  Ask your health care  provider when you can drive, ride a bicycle, or use heavy machinery. Your ability to react may be slower after a brain injury. Do not do these activities if you are dizzy. Lifestyle   Do not drink alcohol until your health care provider approves. Do not use drugs. Alcohol and certain drugs may slow your recovery and can put you at risk of further injury.  If it is harder than usual to remember things, write them down.  If you are easily distracted, try to do one thing at a time.  Talk with family members or close friends when making important decisions.  Tell your friends, family, a trusted colleague, and work Freight forwarder about your injury, symptoms, and restrictions. Have them watch for any new or worsening problems. General instructions  Take over-the-counter and prescription medicines only as told by your health care provider.  Have someone stay with you for 24 hours after your head injury. This person should watch you for any changes in your symptoms and be ready to seek medical help.  Keep all follow-up visits as told by your health care provider. This is important. How is this prevented?  Work on improving your balance and strength to avoid falls.  Wear a seatbelt when you are in a moving vehicle.  Wear a helmet when riding a bicycle, skiing, or doing any other sport or activity that has a risk of injury.  If you drink alcohol: ? Limit how much you use to:  0-1 drink a day for women.  0-2 drinks a day for men. ? Be aware of how much alcohol is in your drink. In the [place] one drink equals one 12 oz bottle of beer (355 mL), one 5 oz glass of wine (148 mL), or one 1 oz glass of hard liquor (44 mL).  Take safety measures in your home, such as: ? Removing clutter and tripping hazards from floors and stairways. ? Using grab bars in bathrooms and handrails by stairs. ? Placing non-slip mats on floors and in bathtubs. ? Improving lighting in dim areas. Get help right away  if:  You have: ? A severe headache that is not helped by medicine. ? Trouble walking or weakness in your arms and legs. ? Clear or bloody fluid coming from your nose or ears. ? Changes in your vision. ? A seizure.  You lose your balance.  You vomit.  Your pupils change size.  Your speech is slurred.  Your dizziness gets worse.  You faint.  You are sleepier than normal and have trouble staying awake.  Your symptoms get worse. These symptoms may represent a serious problem that is an emergency. Do not wait to see if the symptoms will go away. Get medical help right away. Call your local emergency services (911 in the [place] Do not drive yourself to the hospital. Summary  Head injuries can be minor or they can be a serious medical issue requiring immediate attention.  Treatment for this condition depends on the severity and type of injury you have.  Ask your health care provider when it is safe for you to return to your regular activities, including work or school.  Head injury prevention includes wearing a seat belt in a motor vehicle, using a helmet on a bicycle, limiting alcohol  use, and taking safety measures in your home. This information is not intended to replace advice given to you by your health care provider. Make sure you discuss any questions you have with your health care provider. Document Revised: 05/05/2018 Document Reviewed: 04/30/2018 Elsevier Patient Education  [place].

## 2020-01-18 ENCOUNTER — Other Ambulatory Visit: Payer: Self-pay | Admitting: Family Medicine

## 2020-01-18 DIAGNOSIS — R7989 Other specified abnormal findings of blood chemistry: Secondary | ICD-10-CM

## 2020-01-18 LAB — CBC WITH DIFFERENTIAL/PLATELET
Absolute Monocytes: 460 cells/uL (ref 200–950)
Basophils Absolute: 71 cells/uL (ref 0–200)
Basophils Relative: 1.2 %
Eosinophils Absolute: 100 cells/uL (ref 15–500)
Eosinophils Relative: 1.7 %
HCT: 41.1 % (ref 35.0–45.0)
Hemoglobin: 13 g/dL (ref 11.7–15.5)
Lymphs Abs: 2690 cells/uL (ref 850–3900)
MCH: 24.6 pg — ABNORMAL LOW (ref 27.0–33.0)
MCHC: 31.6 g/dL — ABNORMAL LOW (ref 32.0–36.0)
MCV: 77.7 fL — ABNORMAL LOW (ref 80.0–100.0)
MPV: 11.6 fL (ref 7.5–12.5)
Monocytes Relative: 7.8 %
Neutro Abs: 2578 cells/uL (ref 1500–7800)
Neutrophils Relative %: 43.7 %
Platelets: 177 10*3/uL (ref 140–400)
RBC: 5.29 10*6/uL — ABNORMAL HIGH (ref 3.80–5.10)
RDW: 14.7 % (ref 11.0–15.0)
Total Lymphocyte: 45.6 %
WBC: 5.9 10*3/uL (ref 3.8–10.8)

## 2020-01-18 LAB — IRON,TIBC AND FERRITIN PANEL
%SAT: 15 % (calc) — ABNORMAL LOW (ref 16–45)
Ferritin: 80 ng/mL (ref 16–232)
Iron: 47 ug/dL (ref 45–160)
TIBC: 322 mcg/dL (calc) (ref 250–450)

## 2020-02-15 ENCOUNTER — Other Ambulatory Visit: Payer: Self-pay | Admitting: Family

## 2020-02-15 DIAGNOSIS — D509 Iron deficiency anemia, unspecified: Secondary | ICD-10-CM

## 2020-02-15 DIAGNOSIS — D649 Anemia, unspecified: Secondary | ICD-10-CM

## 2020-02-15 DIAGNOSIS — E538 Deficiency of other specified B group vitamins: Secondary | ICD-10-CM

## 2020-02-15 DIAGNOSIS — R718 Other abnormality of red blood cells: Secondary | ICD-10-CM

## 2020-02-16 ENCOUNTER — Encounter: Payer: Self-pay | Admitting: Family

## 2020-02-16 ENCOUNTER — Inpatient Hospital Stay: Payer: 59 | Admitting: Hematology & Oncology

## 2020-02-16 ENCOUNTER — Other Ambulatory Visit: Payer: Self-pay

## 2020-02-16 ENCOUNTER — Inpatient Hospital Stay (HOSPITAL_BASED_OUTPATIENT_CLINIC_OR_DEPARTMENT_OTHER): Payer: 59 | Admitting: Family

## 2020-02-16 ENCOUNTER — Inpatient Hospital Stay: Payer: 59

## 2020-02-16 ENCOUNTER — Inpatient Hospital Stay: Payer: 59 | Attending: Hematology & Oncology

## 2020-02-16 VITALS — BP 147/62 | HR 60 | Temp 98.3°F | Resp 18 | Ht 66.0 in | Wt 281.0 lb

## 2020-02-16 DIAGNOSIS — D649 Anemia, unspecified: Secondary | ICD-10-CM

## 2020-02-16 DIAGNOSIS — R5383 Other fatigue: Secondary | ICD-10-CM | POA: Insufficient documentation

## 2020-02-16 DIAGNOSIS — Z800 Family history of malignant neoplasm of digestive organs: Secondary | ICD-10-CM | POA: Diagnosis not present

## 2020-02-16 DIAGNOSIS — D509 Iron deficiency anemia, unspecified: Secondary | ICD-10-CM

## 2020-02-16 DIAGNOSIS — E538 Deficiency of other specified B group vitamins: Secondary | ICD-10-CM

## 2020-02-16 DIAGNOSIS — Z8049 Family history of malignant neoplasm of other genital organs: Secondary | ICD-10-CM | POA: Insufficient documentation

## 2020-02-16 DIAGNOSIS — R718 Other abnormality of red blood cells: Secondary | ICD-10-CM

## 2020-02-16 LAB — CMP (CANCER CENTER ONLY)
ALT: 10 U/L (ref 0–44)
AST: 15 U/L (ref 15–41)
Albumin: 4.6 g/dL (ref 3.5–5.0)
Alkaline Phosphatase: 57 U/L (ref 38–126)
Anion gap: 7 (ref 5–15)
BUN: 15 mg/dL (ref 6–20)
CO2: 31 mmol/L (ref 22–32)
Calcium: 10.4 mg/dL — ABNORMAL HIGH (ref 8.9–10.3)
Chloride: 101 mmol/L (ref 98–111)
Creatinine: 0.88 mg/dL (ref 0.44–1.00)
GFR, Estimated: >60 mL/min
Glucose, Bld: 88 mg/dL (ref 70–99)
Potassium: 3.8 mmol/L (ref 3.5–5.1)
Sodium: 139 mmol/L (ref 135–145)
Total Bilirubin: 0.4 mg/dL (ref 0.3–1.2)
Total Protein: 8.3 g/dL — ABNORMAL HIGH (ref 6.5–8.1)

## 2020-02-16 LAB — CBC WITH DIFFERENTIAL (CANCER CENTER ONLY)
Abs Immature Granulocytes: 0.03 10*3/uL (ref 0.00–0.07)
Basophils Absolute: 0.1 10*3/uL (ref 0.0–0.1)
Basophils Relative: 1 %
Eosinophils Absolute: 0.1 10*3/uL (ref 0.0–0.5)
Eosinophils Relative: 1 %
HCT: 44.7 % (ref 36.0–46.0)
Hemoglobin: 13.9 g/dL (ref 12.0–15.0)
Immature Granulocytes: 1 %
Lymphocytes Relative: 52 %
Lymphs Abs: 3 10*3/uL (ref 0.7–4.0)
MCH: 24 pg — ABNORMAL LOW (ref 26.0–34.0)
MCHC: 31.1 g/dL (ref 30.0–36.0)
MCV: 77.2 fL — ABNORMAL LOW (ref 80.0–100.0)
Monocytes Absolute: 0.3 10*3/uL (ref 0.1–1.0)
Monocytes Relative: 6 %
Neutro Abs: 2.2 10*3/uL (ref 1.7–7.7)
Neutrophils Relative %: 39 %
Platelet Count: 205 10*3/uL (ref 150–400)
RBC: 5.79 MIL/uL — ABNORMAL HIGH (ref 3.87–5.11)
RDW: 15.5 % (ref 11.5–15.5)
WBC Count: 5.7 10*3/uL (ref 4.0–10.5)
nRBC: 0 % (ref 0.0–0.2)

## 2020-02-16 LAB — RETICULOCYTES
Immature Retic Fract: 10.9 % (ref 2.3–15.9)
RBC.: 5.69 MIL/uL — ABNORMAL HIGH (ref 3.87–5.11)
Retic Count, Absolute: 75.7 10*3/uL (ref 19.0–186.0)
Retic Ct Pct: 1.3 % (ref 0.4–3.1)

## 2020-02-16 LAB — SAVE SMEAR(SSMR), FOR PROVIDER SLIDE REVIEW

## 2020-02-16 LAB — LACTATE DEHYDROGENASE: LDH: 158 U/L (ref 98–192)

## 2020-02-16 LAB — VITAMIN B12: Vitamin B-12: 914 pg/mL (ref 180–914)

## 2020-02-16 LAB — FOLATE: Folate: 17.1 ng/mL (ref >5.9)

## 2020-02-16 NOTE — Progress Notes (Signed)
Hematology/Oncology Consultation   Name: Heather [name]      MRN: [id number]    Location: Room/bed info not found  Date: 02/16/2020 Time:12:33 PM   REFERRING PHYSICIAN: Lyndal Pulley, DO  REASON FOR CONSULT: Abnormal CBC   DIAGNOSIS: Low MCV   HISTORY OF PRESENT ILLNESS: Heather [name] is a very pleasant 59 yo African American female with long standing history of low MCV.  She has been feeling fatigued and is cold natured.  She states that her stool has been regular but she has urgency at times with dark tarry stools. She has a colonoscopy scheduled with Dr. Fuller Plan on 03/02/2020 for further eval.  No obvious blood loss noted. No abnormal bruising, no petechiae.  Her iron saturation recently was low at 15%. Ferritin was 80.  She states that her mother and brothers have history of anemia.   No sickle cell disease or trait that she knows of.  She had a hysterectomy in her 30's due to fibroids and heavy cycles. That has been her only surgery and she states there were no complications.  No personal history of cancer. Family history includes: brother - colon, maternal grandmother - uterine and maternal grandfather - pancreatic.  She had her mammogram in July of this year and it was negative.  She had a 2 weeks period a month or so ago with persistent headaches that did improve with Tylenol. She had dizziness and one episode of dizziness where the room started spinning that sounds like possible vertigo. She had a headache this past Sunday that resolved with Tylenol.  No fever, chills, n/v, cough, rash, SOB, chest pain, palpitations, abdominal pain or changes in bladder habits.  No swelling, tenderness, numbness or tingling in her extremities at this time. She does occasionally noted puffiness in her feet and ankles at the end of the day.   No falls or syncopal episodes to report.  No smoking or recreational drug use. She has the occasional glass of wine socially.  She has maintained a good appetite  and is staying well hydrated. She is concerned with weight gain over the last year and a half during Covid. She has been trying to get back into a regular exercise regimen.   ROS: All other 10 point review of systems is negative.   PAST MEDICAL HISTORY:   Past Medical History:  Diagnosis Date   Acute bronchitis    Allergic rhinitis    Allergy    Anemia    Anxiety    Arthritis    knees   Back pain    Glaucoma    Hyperlipidemia    Hypertension    Joint pain    Leg edema    Prediabetes     ALLERGIES: Allergies  Allergen Reactions   Oseltamivir Phosphate       MEDICATIONS:  Current Outpatient Medications on File Prior to Visit  Medication Sig Dispense Refill   buPROPion (WELLBUTRIN SR) 150 MG 12 hr tablet Take 1 tablet (150 mg total) by mouth daily. 30 tablet 0   cyclobenzaprine (FLEXERIL) 10 MG tablet Take 1 tablet by mouth three times daily as needed for muscle spasm 30 tablet 0   diclofenac sodium (VOLTAREN) 1 % GEL APPLY 4 GRAMS  TOPICALLY 4 TIMES DAILY 100 g 0   fluticasone (FLONASE) 50 MCG/ACT nasal spray Place 2 sprays into both nostrils daily. 48 g 1   furosemide (LASIX) 20 MG tablet Take 1 tablet (20 mg total) by mouth daily. 90 tablet 1  Lactobacillus (PROBIOTIC ACIDOPHILUS PO) Take by mouth.     latanoprost (XALATAN) 0.005 % ophthalmic solution      levocetirizine (XYZAL) 5 MG tablet Take 1 tablet (5 mg total) by mouth every evening. 90 tablet 1   lisinopril (ZESTRIL) 10 MG tablet Take 1 tablet (10 mg total) by mouth daily. 90 tablet 1   meloxicam (MOBIC) 15 MG tablet 1/2-1 po qd prn pain 30 tablet 0   Multiple Vitamin (MULTIVITAMIN) tablet Take 1 tablet by mouth daily.       pravastatin (PRAVACHOL) 40 MG tablet Take 1 tablet by mouth once daily 90 tablet 1   No current facility-administered medications on file prior to visit.     PAST SURGICAL HISTORY Past Surgical History:  Procedure Laterality Date   ABDOMINAL HYSTERECTOMY  1998    BREAST LUMPECTOMY     CESAREAN SECTION     MYOMECTOMY      FAMILY HISTORY: Family History  Problem Relation Age of Onset   Dementia Father    Stroke Father    Atrial fibrillation Father    Alzheimer's disease Father    Hyperlipidemia Father    Hypertension Father    Heart disease Father    Prostate cancer Father 29   Hyperlipidemia Brother    Hypertension Brother    Hyperlipidemia Brother    Hypertension Brother    Hypertension Brother    Pulmonary embolism Brother    Cancer - Colon Brother 65       colon CA--stage 4   Colon cancer Cousin 82   Hypertension Mother    Hyperlipidemia Mother    Heart disease Mother    Endometrial cancer Other    Atrial fibrillation Other    Diabetes Maternal Aunt    Diabetes Maternal Uncle    Kidney disease Maternal Uncle        dialysis   Uterine cancer Maternal Grandmother    Cancer Maternal Grandfather        pancreatic    SOCIAL HISTORY:  reports that she has never smoked. She has never used smokeless tobacco. She reports current alcohol use. She reports that she does not use drugs.  PERFORMANCE STATUS: The patient's performance status is 1 - Symptomatic but completely ambulatory  PHYSICAL EXAM: Most Recent Vital Signs: There were no vitals taken for this visit. BP (!) 147/62 (Patient Position: Sitting)    Pulse 60    Temp 98.3 F (36.8 C) (Oral)    Resp 18    Ht 5\' 6"  (1.676 m)    Wt 281 lb (127.5 kg)    SpO2 100%    BMI 45.35 kg/m   General Appearance:    Alert, cooperative, no distress, appears stated age  Head:    Normocephalic, without obvious abnormality, atraumatic  Eyes:    PERRL, conjunctiva/corneas clear, EOM's intact, fundi    benign, both eyes        Throat:   Lips, mucosa, and tongue normal; teeth and gums normal  Neck:   Supple, symmetrical, trachea midline, no adenopathy;    thyroid:  no enlargement/tenderness/nodules; no carotid   bruit or JVD  Back:     Symmetric, no  curvature, ROM normal, no CVA tenderness  Lungs:     Clear to auscultation bilaterally, respirations unlabored  Chest Wall:    No tenderness or deformity   Heart:    Regular rate and rhythm, S1 and S2 normal, no murmur, rub   or gallop     Abdomen:  Soft, non-tender, bowel sounds active all four quadrants,    no masses, no organomegaly        Extremities:   Extremities normal, atraumatic, no cyanosis or edema  Pulses:   2+ and symmetric all extremities  Skin:   Skin color, texture, turgor normal, no rashes or lesions  Lymph nodes:   Cervical, supraclavicular, and axillary nodes normal  Neurologic:   CNII-XII intact, normal strength, sensation and reflexes    throughout    LABORATORY DATA:  Results for orders placed or performed in visit on 02/16/20 (from the past 48 hour(s))  Save Smear (SSMR)     Status: None   Collection Time: 02/16/20 11:40 AM  Result Value Ref Range   Smear Review SMEAR STAINED AND AVAILABLE FOR REVIEW     Comment: Performed at [facility] at [facility], [address], [place], [place] [address]  CMP ([place] only)     Status: Abnormal   Collection Time: 02/16/20 11:40 AM  Result Value Ref Range   Sodium 139 135 - 145 mmol/L   Potassium 3.8 3.5 - 5.1 mmol/L   Chloride 101 98 - 111 mmol/L   CO2 31 22 - 32 mmol/L   Glucose, Bld 88 70 - 99 mg/dL    Comment: Glucose reference range applies only to samples taken after fasting for at least 8 hours.   BUN 15 6 - 20 mg/dL   Creatinine 0.88 0.44 - 1.00 mg/dL   Calcium 10.4 (H) 8.9 - 10.3 mg/dL   Total Protein 8.3 (H) 6.5 - 8.1 g/dL   Albumin 4.6 3.5 - 5.0 g/dL   AST 15 15 - 41 U/L   ALT 10 0 - 44 U/L   Alkaline Phosphatase 57 38 - 126 U/L   Total Bilirubin 0.4 0.3 - 1.2 mg/dL   GFR, Estimated >60 >60 mL/min    Comment: (NOTE) Calculated using the CKD-EPI Creatinine Equation (2021)    Anion gap 7 5 - 15    Comment: Performed at [facility] at [facility], [address] [place], [place] [address]  Reticulocytes     Status: Abnormal   Collection Time: 02/16/20 11:40 AM  Result Value Ref Range   Retic Ct Pct 1.3 0.4 - 3.1 %   RBC. 5.69 (H) 3.87 - 5.11 MIL/uL   Retic Count, Absolute 75.7 19.0 - 186.0 K/uL   Immature Retic Fract 10.9 2.3 - 15.9 %    Comment: Performed at [facility] Lab at [facility], [address] [place], [place] [address]  CBC with Differential ([place] Only)     Status: Abnormal   Collection Time: 02/16/20 11:40 AM  Result Value Ref Range   WBC Count 5.7 4.0 - 10.5 K/uL   RBC 5.79 (H) 3.87 - 5.11 MIL/uL   Hemoglobin 13.9 12.0 - 15.0 g/dL   HCT 44.7 36 - 46 %   MCV 77.2 (L) 80.0 - 100.0 fL   MCH 24.0 (L) 26.0 - 34.0 pg   MCHC 31.1 30.0 - 36.0 g/dL   RDW 15.5 11.5 - 15.5 %   Platelet Count 205 150 - 400 K/uL   nRBC 0.0 0.0 - 0.2 %   Neutrophils Relative % 39 %   Neutro Abs 2.2 1.7 - 7.7 K/uL   Lymphocytes Relative 52 %   Lymphs Abs 3.0 0.7 - 4.0 K/uL   Monocytes Relative 6 %  Monocytes Absolute 0.3 0.1 - 1.0 K/uL   Eosinophils Relative 1 %   Eosinophils Absolute 0.1 0.0 - 0.5 K/uL   Basophils Relative 1 %   Basophils Absolute 0.1 0.0 - 0.1 K/uL   Immature Granulocytes 1 %   Abs Immature Granulocytes 0.03 0.00 - 0.07 K/uL    Comment: Performed at [facility] Lab at [facility], [address] [place], [place] [address]      RADIOGRAPHY: No results found.     PATHOLOGY: None   ASSESSMENT/PLAN: Ms. Kingsbury is a very pleasant 59 yo African American female with long standing history of low MCV and recent mild iron deficiency with possible GI blood loss.  She will be having her colonoscopy with Dr. Fuller Plan in 2 weeks.  Dr. Marin Olp was able to view her blood smear. No abnormality or evidence of malignancy was noted.  We will see what her lab work-up reveals. Once her results are available we will determine a plan of care.  We will go ahead and  plan to see her again in 2 months.   All questions were answered and she is in agreement with the plan. She can contact our office with any questions or concerns. We can certainly see her sooner if needed.   The patient was discussed with Dr. Marin Olp and he is in agreement with the aforementioned.   Laverna Peace, NP

## 2020-02-17 ENCOUNTER — Ambulatory Visit (AMBULATORY_SURGERY_CENTER): Payer: Self-pay | Admitting: *Deleted

## 2020-02-17 ENCOUNTER — Other Ambulatory Visit: Payer: Self-pay

## 2020-02-17 ENCOUNTER — Telehealth: Payer: Self-pay | Admitting: Hematology & Oncology

## 2020-02-17 ENCOUNTER — Encounter: Payer: Self-pay | Admitting: Gastroenterology

## 2020-02-17 VITALS — Ht 67.0 in | Wt 280.0 lb

## 2020-02-17 DIAGNOSIS — Z800 Family history of malignant neoplasm of digestive organs: Secondary | ICD-10-CM

## 2020-02-17 DIAGNOSIS — Z86010 Personal history of colonic polyps: Secondary | ICD-10-CM

## 2020-02-17 LAB — IRON AND TIBC
Iron: 95 ug/dL (ref 41–142)
Saturation Ratios: 29 % (ref 21–57)
TIBC: 326 ug/dL (ref 236–444)
UIBC: 231 ug/dL (ref 120–384)

## 2020-02-17 LAB — FERRITIN: Ferritin: 144 ng/mL (ref 11–307)

## 2020-02-17 LAB — ERYTHROPOIETIN: Erythropoietin: 15.6 m[IU]/mL (ref 2.6–18.5)

## 2020-02-17 MED ORDER — PLENVU 140 G PO SOLR: 1.0000 | ORAL | 0 refills | Status: DC

## 2020-02-17 NOTE — Telephone Encounter (Signed)
Appointments scheduled calendar printed & mailed per 10/28 los

## 2020-02-17 NOTE — Progress Notes (Signed)
No egg or soy allergy known to patient  No issues with past sedation with any surgeries or procedures no intubation problems in the past  No FH of Malignant Hyperthermia No diet pills per patient No home 02 use per patient  No blood thinners per patient  Pt denies issues with constipation  No A fib or A flutter  EMMI video to pt or via MyChart  COVID 19 guidelines implemented in PV today with Pt and RN   cov vax x 2  Plus booster   Pt saw ONC 10-28 for anemia and dark stools as well   Plenvu  Coupon given to pt in PV today , Code to Pharmacy   Due to the COVID-19 pandemic we are asking patients to follow these guidelines. Please only bring one care partner. Please be aware that your care partner may wait in the car in the parking lot or if they feel like they will be too hot to wait in the car, they may wait in the lobby on the 4th floor. All care partners are required to wear a mask the entire time (we do not have any that we can provide them), they need to practice social distancing, and we will do a Covid check for all patient's and care partners when you arrive. Also we will check their temperature and your temperature. If the care partner waits in their car they need to stay in the parking lot the entire time and we will call them on their cell phone when the patient is ready for discharge so they can bring the car to the front of the building. Also all patient's will need to wear a mask into building.

## 2020-02-20 LAB — HGB FRACTIONATION CASCADE
Hgb A2: 2.6 % (ref 1.8–3.2)
Hgb A: 97.4 % (ref 96.4–98.8)
Hgb F: 0 % (ref 0.0–2.0)
Hgb S: 0 %

## 2020-02-27 ENCOUNTER — Encounter: Payer: Self-pay | Admitting: *Deleted

## 2020-02-27 ENCOUNTER — Other Ambulatory Visit: Payer: Self-pay | Admitting: Family

## 2020-02-27 DIAGNOSIS — D560 Alpha thalassemia: Secondary | ICD-10-CM

## 2020-02-27 LAB — ALPHA-THALASSEMIA GENOTYPR

## 2020-02-27 MED ORDER — FOLIC ACID 1 MG PO TABS: 1.0000 mg | ORAL_TABLET | Freq: Every day | ORAL | 11 refills | Status: DC

## 2020-03-02 ENCOUNTER — Ambulatory Visit (AMBULATORY_SURGERY_CENTER): Payer: 59 | Admitting: Gastroenterology

## 2020-03-02 ENCOUNTER — Encounter: Payer: Self-pay | Admitting: Gastroenterology

## 2020-03-02 ENCOUNTER — Other Ambulatory Visit: Payer: Self-pay

## 2020-03-02 VITALS — BP 148/76 | HR 58 | Temp 96.8°F | Resp 21 | Ht 67.0 in | Wt 280.0 lb

## 2020-03-02 DIAGNOSIS — Z800 Family history of malignant neoplasm of digestive organs: Secondary | ICD-10-CM

## 2020-03-02 DIAGNOSIS — K635 Polyp of colon: Secondary | ICD-10-CM

## 2020-03-02 DIAGNOSIS — Z1211 Encounter for screening for malignant neoplasm of colon: Secondary | ICD-10-CM

## 2020-03-02 DIAGNOSIS — D125 Benign neoplasm of sigmoid colon: Secondary | ICD-10-CM

## 2020-03-02 MED ORDER — SODIUM CHLORIDE 0.9 % IV SOLN: 500.0000 mL | Freq: Once | INTRAVENOUS | Status: DC

## 2020-03-02 NOTE — Progress Notes (Signed)
[facility]  Pt's states no medical or surgical changes since previsit or office visit.  

## 2020-03-02 NOTE — Op Note (Signed)
[facility] Patient Name: Heather [name] Procedure Date: 03/02/2020 8:57 AM MRN: [id number] Endoscopist: Ladene Artist , MD Age: 59 Referring MD:  Date of Birth: April 16, 1961 Gender: Female Account #: [id number] Procedure:                Colonoscopy Indications:              Screening in patient at increased risk: Family                            history of 1st-degree relative with colorectal                            cancer Medicines:                Monitored Anesthesia Care Procedure:                Pre-Anesthesia Assessment:                           - Prior to the procedure, a History and Physical                            was performed, and patient medications and                            allergies were reviewed. The patient's tolerance of                            previous anesthesia was also reviewed. The risks                            and benefits of the procedure and the sedation                            options and risks were discussed with the patient.                            All questions were answered, and informed consent                            was obtained. Prior Anticoagulants: The patient has                            taken no previous anticoagulant or antiplatelet                            agents. ASA Grade Assessment: III - A patient with                            severe systemic disease. After reviewing the risks                            and benefits, the patient was deemed in  satisfactory condition to undergo the procedure.                           After obtaining informed consent, the colonoscope                            was passed under direct vision. Throughout the                            procedure, the patient's blood pressure, pulse, and                            oxygen saturations were monitored continuously. The                            Colonoscope was introduced through the anus and                             advanced to the the cecum, identified by                            appendiceal orifice and ileocecal valve. The                            Colonoscope was introduced through the anus and                            advanced to the the cecum, identified by                            appendiceal orifice and ileocecal valve. The                            ileocecal valve, appendiceal orifice, and rectum                            were photographed. The quality of the bowel                            preparation was adequate after extensive lavage,                            suction. The colonoscopy was somewhat difficult due                            to significant looping, a tortuous colon and the                            patient's body habitus. Successful completion of                            the procedure was aided by withdrawing the scope  and replacing the pediatric colonoscope with the                            adult colonoscope. The patient tolerated the                            procedure well. Scope In: 9:08:09 AM Scope Out: 9:46:28 AM Scope Withdrawal Time: 0 hours 30 minutes 30 seconds  Total Procedure Duration: 0 hours 38 minutes 19 seconds  Findings:                 The perianal and digital rectal examinations were                            normal.                           Two sessile polyps were found in the sigmoid colon.                            The polyps were 6 to 7 mm in size. These polyps                            were removed with a cold snare. Resection and                            retrieval were complete.                           The exam was otherwise without abnormality on                            direct and retroflexion views. Complications:            No immediate complications. Estimated blood loss:                            None. Estimated Blood Loss:     Estimated blood loss:  none. Impression:               - Two 6 to 7 mm polyps in the sigmoid colon,                            removed with a cold snare. Resected and retrieved.                           - The examination was otherwise normal on direct                            and retroflexion views. Recommendation:           - Repeat colonoscopy in 5 years for surveillance                            based on pathology results with a more extensive  bowel prep.                           - Patient has a contact number available for                            emergencies. The signs and symptoms of potential                            delayed complications were discussed with the                            patient. Return to normal activities tomorrow.                            Written discharge instructions were provided to the                            patient.                           - Resume previous diet.                           - Continue present medications.                           - Await pathology results. Ladene Artist, MD 03/02/2020 9:51:52 AM This report has been signed electronically.

## 2020-03-02 NOTE — Progress Notes (Signed)
PT taken to PACU. Monitors in place. VSS. Report given to RN. 

## 2020-03-02 NOTE — Patient Instructions (Signed)
Handout given for polyps.  Await pathology results.  YOU HAD AN ENDOSCOPIC PROCEDURE TODAY AT THE [facility]:   Refer to the procedure report that was given to you for any specific questions about what was found during the examination.  If the procedure report does not answer your questions, please call your gastroenterologist to clarify.  If you requested that your care partner not be given the details of your procedure findings, then the procedure report has been included in a sealed envelope for you to review at your convenience later.  YOU SHOULD EXPECT: Some feelings of bloating in the abdomen. Passage of more gas than usual.  Walking can help get rid of the air that was put into your GI tract during the procedure and reduce the bloating. If you had a lower endoscopy (such as a colonoscopy or flexible sigmoidoscopy) you may notice spotting of blood in your stool or on the toilet paper. If you underwent a bowel prep for your procedure, you may not have a normal bowel movement for a few days.  Please Note:  You might notice some irritation and congestion in your nose or some drainage.  This is from the oxygen used during your procedure.  There is no need for concern and it should clear up in a day or so.  SYMPTOMS TO REPORT IMMEDIATELY:   Following lower endoscopy (colonoscopy or flexible sigmoidoscopy):  Excessive amounts of blood in the stool  Significant tenderness or worsening of abdominal pains  Swelling of the abdomen that is new, acute  Fever of 100F or higher  For urgent or emergent issues, a gastroenterologist can be reached at any hour by calling [phone]. Do not use MyChart messaging for urgent concerns.    DIET:  We do recommend a small meal at first, but then you may proceed to your regular diet.  Drink plenty of fluids but you should avoid alcoholic beverages for 24 hours.  ACTIVITY:  You should plan to take it easy for the rest of today and you should  NOT DRIVE or use heavy machinery until tomorrow (because of the sedation medicines used during the test).    FOLLOW UP: Our staff will call the number listed on your records 48-72 hours following your procedure to check on you and address any questions or concerns that you may have regarding the information given to you following your procedure. If we do not reach you, we will leave a message.  We will attempt to reach you two times.  During this call, we will ask if you have developed any symptoms of COVID 19. If you develop any symptoms (ie: fever, flu-like symptoms, shortness of breath, cough etc.) before then, please call [phone].  If you test positive for Covid 19 in the 2 weeks post procedure, please call and report this information to us.    If any biopsies were taken you will be contacted by phone or by letter within the next 1-3 weeks.  Please call us at [phone] if you have not heard about the biopsies in 3 weeks.    SIGNATURES/CONFIDENTIALITY: You and/or your care partner have signed paperwork which will be entered into your electronic medical record.  These signatures attest to the fact that that the information above on your After Visit Summary has been reviewed and is understood.  Full responsibility of the confidentiality of this discharge information lies with you and/or your care-partner. 

## 2020-03-06 ENCOUNTER — Telehealth: Payer: Self-pay | Admitting: *Deleted

## 2020-03-06 ENCOUNTER — Telehealth: Payer: Self-pay

## 2020-03-06 NOTE — Telephone Encounter (Signed)
No answer, left message to call if having any issues or concerns, B.Grecia Lynk RN 

## 2020-03-06 NOTE — Telephone Encounter (Signed)
First follow up call attempt.  LVM. 

## 2020-03-10 ENCOUNTER — Other Ambulatory Visit: Payer: Self-pay | Admitting: Family Medicine

## 2020-03-13 ENCOUNTER — Encounter: Payer: Self-pay | Admitting: Gastroenterology

## 2020-03-14 ENCOUNTER — Other Ambulatory Visit: Payer: Self-pay | Admitting: Family

## 2020-04-04 ENCOUNTER — Telehealth: Payer: Self-pay

## 2020-04-04 NOTE — Telephone Encounter (Signed)
Pt called in to r/s her 04/17/20 appt to a diff time.... AOM

## 2020-04-16 ENCOUNTER — Other Ambulatory Visit: Payer: Self-pay | Admitting: Family

## 2020-04-16 DIAGNOSIS — D560 Alpha thalassemia: Secondary | ICD-10-CM

## 2020-04-16 DIAGNOSIS — D649 Anemia, unspecified: Secondary | ICD-10-CM

## 2020-04-17 ENCOUNTER — Telehealth: Payer: Self-pay | Admitting: Family

## 2020-04-17 ENCOUNTER — Inpatient Hospital Stay (HOSPITAL_BASED_OUTPATIENT_CLINIC_OR_DEPARTMENT_OTHER): Payer: 59 | Admitting: Family

## 2020-04-17 ENCOUNTER — Other Ambulatory Visit: Payer: Self-pay

## 2020-04-17 ENCOUNTER — Telehealth: Payer: Self-pay

## 2020-04-17 ENCOUNTER — Inpatient Hospital Stay: Payer: 59 | Attending: Hematology & Oncology

## 2020-04-17 ENCOUNTER — Other Ambulatory Visit: Payer: 59

## 2020-04-17 ENCOUNTER — Encounter: Payer: Self-pay | Admitting: Family

## 2020-04-17 ENCOUNTER — Ambulatory Visit: Payer: 59 | Admitting: Family

## 2020-04-17 VITALS — BP 130/55 | HR 69 | Temp 98.1°F | Resp 18 | Ht 67.0 in | Wt 281.1 lb

## 2020-04-17 DIAGNOSIS — D560 Alpha thalassemia: Secondary | ICD-10-CM | POA: Diagnosis not present

## 2020-04-17 DIAGNOSIS — D649 Anemia, unspecified: Secondary | ICD-10-CM

## 2020-04-17 DIAGNOSIS — D509 Iron deficiency anemia, unspecified: Secondary | ICD-10-CM

## 2020-04-17 DIAGNOSIS — R5383 Other fatigue: Secondary | ICD-10-CM | POA: Insufficient documentation

## 2020-04-17 LAB — CBC WITH DIFFERENTIAL (CANCER CENTER ONLY)
Abs Immature Granulocytes: 0.05 10*3/uL (ref 0.00–0.07)
Basophils Absolute: 0.1 10*3/uL (ref 0.0–0.1)
Basophils Relative: 1 %
Eosinophils Absolute: 0.1 10*3/uL (ref 0.0–0.5)
Eosinophils Relative: 2 %
HCT: 41.6 % (ref 36.0–46.0)
Hemoglobin: 13 g/dL (ref 12.0–15.0)
Immature Granulocytes: 1 %
Lymphocytes Relative: 46 %
Lymphs Abs: 2.9 10*3/uL (ref 0.7–4.0)
MCH: 24.1 pg — ABNORMAL LOW (ref 26.0–34.0)
MCHC: 31.3 g/dL (ref 30.0–36.0)
MCV: 77.2 fL — ABNORMAL LOW (ref 80.0–100.0)
Monocytes Absolute: 0.5 10*3/uL (ref 0.1–1.0)
Monocytes Relative: 7 %
Neutro Abs: 2.7 10*3/uL (ref 1.7–7.7)
Neutrophils Relative %: 43 %
Platelet Count: 199 10*3/uL (ref 150–400)
RBC: 5.39 MIL/uL — ABNORMAL HIGH (ref 3.87–5.11)
RDW: 15.9 % — ABNORMAL HIGH (ref 11.5–15.5)
WBC Count: 6.4 10*3/uL (ref 4.0–10.5)
nRBC: 0 % (ref 0.0–0.2)

## 2020-04-17 LAB — RETICULOCYTES
Immature Retic Fract: 9 % (ref 2.3–15.9)
RBC.: 5.46 MIL/uL — ABNORMAL HIGH (ref 3.87–5.11)
Retic Count, Absolute: 80.8 K/uL (ref 19.0–186.0)
Retic Ct Pct: 1.5 % (ref 0.4–3.1)

## 2020-04-17 NOTE — Telephone Encounter (Signed)
Follow up PRN per 12/28

## 2020-04-17 NOTE — Progress Notes (Signed)
Hematology and Oncology Follow Up Visit  Heather [name] [id number] 05/06/1960 59 y.o. 04/17/2020   Principle Diagnosis:  Low MCV with alpha thalassemia   Current Therapy:   Folic acid 1 mg PO daily    Interim History:  Heather [name] is here today for follow-up. She is doing well but has occasional mild fatigue with work. End of year for her is a little stress full at her job.  No blood loss noted. No abnormal bruising, no petechiae.  She is taking her folic acid daily as prescribed.  No fever, chills, n/v, cough, rash, dizziness, SOB, chest pain, palpitations, abdominal pain or changes in bowel or bladder habits.  No swelling, tenderness, numbness or tingling in her extremities.  No falls or syncope.  She has maintained a good appetite and is staying well hydrated. Her weight is stable.   ECOG Performance Status: 1 - Symptomatic but completely ambulatory  Medications:  Allergies as of 04/17/2020      Reactions   Oseltamivir Phosphate       Medication List       Accurate as of April 17, 2020 11:26 AM. If you have any questions, ask your nurse or doctor.        ALEVE PM PO Take 1 tablet by mouth at bedtime as needed.   buPROPion 150 MG 12 hr tablet Commonly known as: Wellbutrin SR Take 1 tablet (150 mg total) by mouth daily.   cholecalciferol 25 MCG (1000 UNIT) tablet Commonly known as: VITAMIN D3 Take 1,000 Units by mouth daily.   cyclobenzaprine 10 MG tablet Commonly known as: FLEXERIL Take 1 tablet by mouth three times daily as needed for muscle spasm   diclofenac sodium 1 % Gel Commonly known as: VOLTAREN APPLY 4 GRAMS  TOPICALLY 4 TIMES DAILY   fluticasone 50 MCG/ACT nasal spray Commonly known as: FLONASE Place 2 sprays into both nostrils daily. What changed: additional instructions   folic acid 1 MG tablet Commonly known as: FOLVITE Take 1 tablet (1 mg total) by mouth daily.   furosemide 20 MG tablet Commonly known as: LASIX Take 1 tablet (20  mg total) by mouth daily.   latanoprost 0.005 % ophthalmic solution Commonly known as: XALATAN   levocetirizine 5 MG tablet Commonly known as: XYZAL Take 1 tablet (5 mg total) by mouth every evening.   lisinopril 10 MG tablet Commonly known as: ZESTRIL Take 1 tablet (10 mg total) by mouth daily.   meloxicam 15 MG tablet Commonly known as: MOBIC 1/2-1 po qd prn pain   multivitamin tablet Take 1 tablet by mouth daily.   pravastatin 40 MG tablet Commonly known as: PRAVACHOL Take 1 tablet (40 mg total) by mouth daily.   PROBIOTIC ACIDOPHILUS PO Take by mouth.   TURMERIC PO Take by mouth.   zinc gluconate 50 MG tablet Take 50 mg by mouth daily.       Allergies:  Allergies  Allergen Reactions   Oseltamivir Phosphate     Past Medical History, Surgical history, Social history, and Family History were reviewed and updated.  Review of Systems: All other 10 point review of systems is negative.   Physical Exam:  vitals were not taken for this visit.   Wt Readings from Last 3 Encounters:  03/02/20 280 lb (127 kg)  02/17/20 280 lb (127 kg)  02/16/20 281 lb (127.5 kg)    Ocular: Sclerae unicteric, pupils equal, round and reactive to light Ear-nose-throat: Oropharynx clear, dentition fair Lymphatic: No cervical or supraclavicular  adenopathy Lungs no rales or rhonchi, good excursion bilaterally Heart regular rate and rhythm, no murmur appreciated Abd soft, nontender, positive bowel sounds MSK no focal spinal tenderness, no joint edema Neuro: non-focal, well-oriented, appropriate affect Breasts: Deferred   Lab Results  Component Value Date   WBC 6.4 04/17/2020   HGB 13.0 04/17/2020   HCT 41.6 04/17/2020   MCV 77.2 (L) 04/17/2020   PLT 199 04/17/2020   Lab Results  Component Value Date   FERRITIN 144 02/16/2020   IRON 95 02/16/2020   TIBC 326 02/16/2020   UIBC 231 02/16/2020   IRONPCTSAT 29 02/16/2020   Lab Results  Component Value Date   RETICCTPCT  1.5 04/17/2020   RBC 5.46 (H) 04/17/2020   RBC 5.39 (H) 04/17/2020   No results found for: KPAFRELGTCHN, LAMBDASER, KAPLAMBRATIO No results found for: IGGSERUM, IGA, IGMSERUM No results found for: Marda Stalker, SPEI   Chemistry      Component Value Date/Time   NA 139 02/16/2020 1140   NA 139 09/24/2017 0921   K 3.8 02/16/2020 1140   CL 101 02/16/2020 1140   CO2 31 02/16/2020 1140   BUN 15 02/16/2020 1140   BUN 24 09/24/2017 0921   CREATININE 0.88 02/16/2020 1140   CREATININE 0.90 01/02/2020 0920      Component Value Date/Time   CALCIUM 10.4 (H) 02/16/2020 1140   ALKPHOS 57 02/16/2020 1140   AST 15 02/16/2020 1140   ALT 10 02/16/2020 1140   BILITOT 0.4 02/16/2020 1140       Impression and Plan: Heather [name] is a very pleasant 59 yo African American female with low MCV with alpha thalassemia and history of mild iron deficiency with possible GI blood loss.  Iron studies are pending. Her last set were stable. We can replace if needed.  She will continue taking folic acid daily.  At this time we will follow-up only as needed. She was encouraged to contact our office with any questions or concerns. We can certainly see her for any heme/onc issues if needed.   Emeline Gins, NP 12/28/202111:26 AM

## 2020-04-17 NOTE — Telephone Encounter (Signed)
Follow up PRN.   Per 04/17/20 los...Marland KitchenAOM

## 2020-04-18 LAB — IRON AND TIBC
Iron: 73 ug/dL (ref 41–142)
Saturation Ratios: 24 % (ref 21–57)
TIBC: 299 ug/dL (ref 236–444)
UIBC: 226 ug/dL (ref 120–384)

## 2020-04-18 LAB — FERRITIN: Ferritin: 129 ng/mL (ref 11–307)

## 2020-05-04 ENCOUNTER — Other Ambulatory Visit: Payer: Self-pay | Admitting: Family Medicine

## 2020-05-04 DIAGNOSIS — I10 Essential (primary) hypertension: Secondary | ICD-10-CM

## 2020-05-04 DIAGNOSIS — R600 Localized edema: Secondary | ICD-10-CM

## 2020-07-02 ENCOUNTER — Ambulatory Visit: Payer: 59 | Admitting: Family Medicine

## 2020-07-02 ENCOUNTER — Other Ambulatory Visit: Payer: Self-pay

## 2020-07-02 ENCOUNTER — Encounter: Payer: Self-pay | Admitting: Family Medicine

## 2020-07-02 VITALS — BP 118/78 | HR 84 | Temp 98.6°F | Resp 18 | Ht 67.0 in | Wt 283.8 lb

## 2020-07-02 DIAGNOSIS — I10 Essential (primary) hypertension: Secondary | ICD-10-CM

## 2020-07-02 DIAGNOSIS — E119 Type 2 diabetes mellitus without complications: Secondary | ICD-10-CM

## 2020-07-02 DIAGNOSIS — D560 Alpha thalassemia: Secondary | ICD-10-CM | POA: Diagnosis not present

## 2020-07-02 DIAGNOSIS — R600 Localized edema: Secondary | ICD-10-CM | POA: Diagnosis not present

## 2020-07-02 DIAGNOSIS — J302 Other seasonal allergic rhinitis: Secondary | ICD-10-CM

## 2020-07-02 DIAGNOSIS — E785 Hyperlipidemia, unspecified: Secondary | ICD-10-CM

## 2020-07-02 DIAGNOSIS — T7840XD Allergy, unspecified, subsequent encounter: Secondary | ICD-10-CM

## 2020-07-02 LAB — COMPREHENSIVE METABOLIC PANEL
ALT: 10 U/L (ref 0–35)
AST: 13 U/L (ref 0–37)
Albumin: 4.1 g/dL (ref 3.5–5.2)
Alkaline Phosphatase: 61 U/L (ref 39–117)
BUN: 12 mg/dL (ref 6–23)
CO2: 34 mEq/L — ABNORMAL HIGH (ref 19–32)
Calcium: 9.9 mg/dL (ref 8.4–10.5)
Chloride: 102 mEq/L (ref 96–112)
Creatinine, Ser: 0.83 mg/dL (ref 0.40–1.20)
GFR: 76.32 mL/min (ref >60.00)
Glucose, Bld: 74 mg/dL (ref 70–99)
Potassium: 4.5 mEq/L (ref 3.5–5.1)
Sodium: 141 mEq/L (ref 135–145)
Total Bilirubin: 0.4 mg/dL (ref 0.2–1.2)
Total Protein: 7.2 g/dL (ref 6.0–8.3)

## 2020-07-02 LAB — LIPID PANEL
Cholesterol: 207 mg/dL — ABNORMAL HIGH (ref 0–200)
HDL: 61.9 mg/dL (ref >39.00)
LDL Cholesterol: 117 mg/dL — ABNORMAL HIGH (ref 0–99)
NonHDL: 144.82
Total CHOL/HDL Ratio: 3
Triglycerides: 141 mg/dL (ref 0.0–149.0)
VLDL: 28.2 mg/dL (ref 0.0–40.0)

## 2020-07-02 LAB — MICROALBUMIN / CREATININE URINE RATIO
Creatinine,U: 17.8 mg/dL
Microalb Creat Ratio: 3.9 mg/g (ref 0.0–30.0)
Microalb, Ur: <0.7 mg/dL (ref 0.0–1.9)

## 2020-07-02 LAB — HEMOGLOBIN A1C: Hgb A1c MFr Bld: 6.1 % (ref 4.6–6.5)

## 2020-07-02 MED ORDER — PRAVASTATIN SODIUM 40 MG PO TABS: 40.0000 mg | ORAL_TABLET | Freq: Every day | ORAL | 1 refills | Status: DC

## 2020-07-02 MED ORDER — LISINOPRIL 10 MG PO TABS: 10.0000 mg | ORAL_TABLET | Freq: Every day | ORAL | 1 refills | Status: DC

## 2020-07-02 MED ORDER — FOLIC ACID 1 MG PO TABS: 1.0000 mg | ORAL_TABLET | Freq: Every day | ORAL | 3 refills | Status: AC

## 2020-07-02 MED ORDER — FUROSEMIDE 20 MG PO TABS: 20.0000 mg | ORAL_TABLET | Freq: Every day | ORAL | 1 refills | Status: DC

## 2020-07-02 MED ORDER — FLUTICASONE PROPIONATE 50 MCG/ACT NA SUSP: 2.0000 | Freq: Every day | NASAL | 1 refills | Status: AC

## 2020-07-02 NOTE — Assessment & Plan Note (Signed)
hgba1c to be checked, minimize simple carbs. Increase exercise as tolerated. Continue current meds  

## 2020-07-02 NOTE — Assessment & Plan Note (Signed)
Well controlled, no changes to meds. Encouraged heart healthy diet such as the DASH diet and exercise as tolerated.  °

## 2020-07-02 NOTE — Assessment & Plan Note (Signed)
hgba1c to be checked minimize simple carbs. Increase exercise as tolerated. Continue current meds 

## 2020-07-02 NOTE — Patient Instructions (Signed)

## 2020-07-02 NOTE — Progress Notes (Signed)
Patient ID: Heather [name], female    DOB: 02-05-1961  Age: 60 y.o. MRN: [id number]    Subjective:  Subjective  HPI Heather [name] presents for a office visit. The patient feels fatigue but she relates it to work.She has a PMHx of Alpha Thalassemia. She denies chest pain , SOB, fever, sore throat, cough, vaginal bleeding, discharge, and pain, and N/V/D.   She reports that she has not been going to health and wellness. However she has been maintaining her diet and reports that her LE swelling has resolved.    Review of Systems  Constitutional: Positive for fatigue. Negative for appetite change, chills and fever.  HENT: Negative for congestion, ear pain, sinus pressure, sneezing and sore throat.   Eyes: Negative for pain.  Respiratory: Negative for cough, shortness of breath and wheezing.   Cardiovascular: Negative for chest pain and palpitations.  Gastrointestinal: Negative for abdominal pain, blood in stool, diarrhea, nausea and vomiting.  Genitourinary: Negative for dysuria, hematuria, vaginal bleeding, vaginal discharge and vaginal pain.  Musculoskeletal: Negative for back pain.  Skin: Negative for rash.  Neurological: Negative for headaches.    History Past Medical History:  Diagnosis Date  . Acute bronchitis   . Allergic rhinitis   . Allergy   . Anemia   . Anxiety   . Arthritis    knees  . Back pain   . Glaucoma   . Hyperlipidemia   . Hypertension   . Joint pain   . Leg edema   . Neuromuscular disorder ([place])    nerve issues in legs   . Prediabetes     She has a past surgical history that includes Abdominal hysterectomy (1998); Breast lumpectomy; Cesarean section; Myomectomy; Colonoscopy; and Polypectomy.   Her family history includes Alzheimer's disease in her father; Atrial fibrillation in her father and another family member; Cancer in her maternal grandfather; Cancer - Colon (age of onset: 22) in her brother; Colon cancer in her brother; Colon cancer (age  of onset: 7) in her cousin; Colon polyps in her brother; Dementia in her father; Diabetes in her maternal aunt and maternal uncle; Endometrial cancer in an other family member; Heart disease in her father and mother; Hyperlipidemia in her brother, brother, father, and mother; Hypertension in her brother, brother, brother, father, and mother; Kidney disease in her maternal uncle; Pancreatic cancer in her maternal grandfather; Prostate cancer (age of onset: 58) in her father; Pulmonary embolism in her brother; Stroke in her father; Uterine cancer in her maternal grandmother.She reports that she has never smoked. She has never used smokeless tobacco. She reports current alcohol use. She reports that she does not use drugs.  Current Outpatient Medications on File Prior to Visit  Medication Sig Dispense Refill  . cholecalciferol (VITAMIN D3) 25 MCG (1000 UNIT) tablet Take 1,000 Units by mouth daily.    . cyclobenzaprine (FLEXERIL) 10 MG tablet Take 1 tablet by mouth three times daily as needed for muscle spasm 30 tablet 0  . diclofenac sodium (VOLTAREN) 1 % GEL APPLY 4 GRAMS  TOPICALLY 4 TIMES DAILY 100 g 0  . Lactobacillus (PROBIOTIC ACIDOPHILUS PO) Take by mouth.    . latanoprost (XALATAN) 0.005 % ophthalmic solution     . levocetirizine (XYZAL) 5 MG tablet Take 1 tablet (5 mg total) by mouth every evening. 90 tablet 1  . meloxicam (MOBIC) 15 MG tablet 1/2-1 po qd prn pain 30 tablet 0  . Multiple Vitamin (MULTIVITAMIN) tablet Take 1 tablet by mouth daily.    Marland Kitchen  Naproxen Sod-diphenhydrAMINE (ALEVE PM PO) Take 1 tablet by mouth at bedtime as needed.    . TURMERIC PO Take by mouth.    . zinc gluconate 50 MG tablet Take 50 mg by mouth daily.     No current facility-administered medications on file prior to visit.     Objective:  Objective  Physical Exam Vitals and nursing note reviewed.  Constitutional:      General: She is not in acute distress.    Appearance: Normal appearance. She is  well-developed. She is not ill-appearing.  HENT:     Head: Normocephalic and atraumatic.     Right Ear: External ear normal.     Left Ear: External ear normal.     Nose: Nose normal.     Mouth/Throat:     Mouth: Mucous membranes are moist.  Eyes:     General:        Right eye: No discharge.        Left eye: No discharge.     Extraocular Movements: Extraocular movements intact.     Pupils: Pupils are equal, round, and reactive to light.  Cardiovascular:     Rate and Rhythm: Normal rate and regular rhythm.     Pulses: Normal pulses.     Heart sounds: Normal heart sounds. No murmur heard.   Pulmonary:     Effort: Pulmonary effort is normal. No respiratory distress.     Breath sounds: Normal breath sounds. No wheezing or rales.  Abdominal:     General: Bowel sounds are normal.     Palpations: Abdomen is soft.     Tenderness: There is no abdominal tenderness.  Musculoskeletal:     Cervical back: Normal range of motion and neck supple.     Right lower leg: No edema.     Left lower leg: No edema.  Skin:    General: Skin is warm and dry.  Neurological:     General: No focal deficit present.     Mental Status: She is alert and oriented to person, place, and time.  Psychiatric:        Behavior: Behavior normal.    BP 118/78 (BP Location: Right Arm, Patient Position: Sitting, Cuff Size: Large)   Pulse 84   Temp 98.6 F (37 C) (Oral)   Resp 18   Ht 5\' 7"  (1.702 m)   Wt 283 lb 12.8 oz (128.7 kg)   SpO2 99%   BMI 44.45 kg/m  Wt Readings from Last 3 Encounters:  07/02/20 283 lb 12.8 oz (128.7 kg)  04/17/20 281 lb 1.9 oz (127.5 kg)  03/02/20 280 lb (127 kg)     Lab Results  Component Value Date   WBC 6.4 04/17/2020   HGB 13.0 04/17/2020   HCT 41.6 04/17/2020   PLT 199 04/17/2020   GLUCOSE 88 02/16/2020   CHOL 187 01/02/2020   TRIG 66 01/02/2020   HDL 66 01/02/2020   LDLDIRECT 128.0 01/03/2013   LDLCALC 106 (H) 01/02/2020   ALT 10 02/16/2020   AST 15 02/16/2020    NA 139 02/16/2020   K 3.8 02/16/2020   CL 101 02/16/2020   CREATININE 0.88 02/16/2020   BUN 15 02/16/2020   CO2 31 02/16/2020   TSH 1.23 01/02/2020   HGBA1C 5.9 06/23/2019   MICROALBUR <0.7 06/23/2019    CT Head Wo Contrast  Result Date: 01/17/2020 CLINICAL DATA:  Head trauma, moderate to severe head injury 1 week ago, headache, dizziness, confusion, struck head on a  with cabinet EXAM: CT HEAD WITHOUT CONTRAST TECHNIQUE: Contiguous axial images were obtained from the base of the skull through the vertex without intravenous contrast. COMPARISON:  11/18/2004 FINDINGS: Brain: Minimal atrophy. Normal ventricular morphology. No midline shift or mass effect. Otherwise normal appearance of brain parenchyma. No intracranial hemorrhage, mass lesion, or evidence of acute infarction. No extra-axial fluid collections. Vascular: No hyperdense vessels Skull: Intact Sinuses/Orbits: Clear Other: N/A IMPRESSION: No acute intracranial abnormalities. Electronically Signed   By: Lavonia Dana M.D.   On: 01/17/2020 09:28     Assessment & Plan:  Plan    Meds ordered this encounter  Medications  . folic acid (FOLVITE) 1 MG tablet    Sig: Take 1 tablet (1 mg total) by mouth daily.    Dispense:  90 tablet    Refill:  3  . pravastatin (PRAVACHOL) 40 MG tablet    Sig: Take 1 tablet (40 mg total) by mouth daily.    Dispense:  90 tablet    Refill:  1  . lisinopril (ZESTRIL) 10 MG tablet    Sig: Take 1 tablet (10 mg total) by mouth daily.    Dispense:  90 tablet    Refill:  1  . furosemide (LASIX) 20 MG tablet    Sig: Take 1 tablet (20 mg total) by mouth daily.    Dispense:  90 tablet    Refill:  1  . fluticasone (FLONASE) 50 MCG/ACT nasal spray    Sig: Place 2 sprays into both nostrils daily.    Dispense:  48 g    Refill:  1    Problem List Items Addressed This Visit      Unprioritized   Allergies    hgba1c to be checked, minimize simple carbs. Increase exercise as tolerated. Continue current  meds       Alpha thalassemia ([facility])    On folic acid       Relevant Medications   folic acid (FOLVITE) 1 MG tablet   Diet-controlled diabetes mellitus ([place])    hgba1c to be checked  minimize simple carbs. Increase exercise as tolerated. Continue current meds      Relevant Medications   pravastatin (PRAVACHOL) 40 MG tablet   lisinopril (ZESTRIL) 10 MG tablet   Other Relevant Orders   Hemoglobin A1c   Microalbumin / creatinine urine ratio   Essential hypertension    Well controlled, no changes to meds. Encouraged heart healthy diet such as the DASH diet and exercise as tolerated.       Relevant Medications   pravastatin (PRAVACHOL) 40 MG tablet   lisinopril (ZESTRIL) 10 MG tablet   furosemide (LASIX) 20 MG tablet   Hyperlipidemia    Tolerating statin, encouraged heart healthy diet, avoid trans fats, minimize simple carbs and saturated fats. Increase exercise as tolerated      Relevant Medications   pravastatin (PRAVACHOL) 40 MG tablet   lisinopril (ZESTRIL) 10 MG tablet   furosemide (LASIX) 20 MG tablet   Other Relevant Orders   Lipid panel   Comprehensive metabolic panel    Other Visit Diagnoses    Primary hypertension    -  Primary   Relevant Medications   pravastatin (PRAVACHOL) 40 MG tablet   lisinopril (ZESTRIL) 10 MG tablet   furosemide (LASIX) 20 MG tablet   Other Relevant Orders   Lipid panel   Comprehensive metabolic panel   Lower extremity edema       Relevant Medications   furosemide (LASIX) 20 MG tablet  Seasonal allergies       Relevant Medications   fluticasone (FLONASE) 50 MCG/ACT nasal spray      Follow-up: Return in about 6 months (around 01/02/2021), or if symptoms worsen or fail to improve, for annual exam, fasting.   I,Alexis Bryant,acting as a Education administrator for [employer], DO.,have documented all relevant documentation on the behalf of Ann Held, DO,as directed by  Ann Held, DO while in the presence of [place], DO, have reviewed all documentation for this visit. The documentation on 07/02/20 for the exam, diagnosis, procedures, and orders are all accurate and complete.

## 2020-07-02 NOTE — Assessment & Plan Note (Signed)
Tolerating statin, encouraged heart healthy diet, avoid trans fats, minimize simple carbs and saturated fats. Increase exercise as tolerated 

## 2020-07-02 NOTE — Assessment & Plan Note (Signed)
On folic acid

## 2020-07-07 ENCOUNTER — Other Ambulatory Visit: Payer: Self-pay | Admitting: Family Medicine

## 2020-07-07 DIAGNOSIS — E1169 Type 2 diabetes mellitus with other specified complication: Secondary | ICD-10-CM

## 2020-07-07 DIAGNOSIS — E119 Type 2 diabetes mellitus without complications: Secondary | ICD-10-CM

## 2020-07-07 DIAGNOSIS — E785 Hyperlipidemia, unspecified: Secondary | ICD-10-CM

## 2020-09-09 ENCOUNTER — Other Ambulatory Visit: Payer: Self-pay | Admitting: Family Medicine

## 2020-09-09 DIAGNOSIS — E785 Hyperlipidemia, unspecified: Secondary | ICD-10-CM

## 2020-11-07 LAB — HM MAMMOGRAPHY

## 2020-12-03 ENCOUNTER — Encounter: Payer: Self-pay | Admitting: Family Medicine

## 2020-12-03 ENCOUNTER — Other Ambulatory Visit (HOSPITAL_BASED_OUTPATIENT_CLINIC_OR_DEPARTMENT_OTHER): Payer: Self-pay

## 2020-12-03 ENCOUNTER — Ambulatory Visit: Payer: 59 | Admitting: Family Medicine

## 2020-12-03 ENCOUNTER — Other Ambulatory Visit: Payer: Self-pay

## 2020-12-03 ENCOUNTER — Telehealth: Payer: Self-pay

## 2020-12-03 VITALS — BP 134/86 | HR 68 | Temp 98.0°F | Resp 18 | Ht 67.0 in | Wt 290.0 lb

## 2020-12-03 DIAGNOSIS — H60332 Swimmer's ear, left ear: Secondary | ICD-10-CM

## 2020-12-03 DIAGNOSIS — H60331 Swimmer's ear, right ear: Secondary | ICD-10-CM | POA: Insufficient documentation

## 2020-12-03 DIAGNOSIS — J0140 Acute pansinusitis, unspecified: Secondary | ICD-10-CM

## 2020-12-03 MED ORDER — OFLOXACIN 0.3 % OT SOLN
10.0000 [drp] | Freq: Every day | OTIC | 0 refills | Status: DC
  Filled 2020-12-03: qty 10, 20d supply, fill #0

## 2020-12-03 MED ORDER — AMOXICILLIN-POT CLAVULANATE 875-125 MG PO TABS
1.0000 | ORAL_TABLET | Freq: Two times a day (BID) | ORAL | 0 refills | Status: DC
  Filled 2020-12-03: qty 20, 10d supply, fill #0

## 2020-12-03 NOTE — Patient Instructions (Signed)

## 2020-12-03 NOTE — Progress Notes (Signed)
Established Patient Office Visit  Subjective:  Patient ID: Heather [name], female    DOB: February 04, 1961  Age: 60 y.o. MRN: [id number]  CC:  Chief Complaint  Patient presents with   Ear Pain   Facial Pain    X1 week, Pt states no COVID test. Pt states having pain around the eyes.     HPI Glenice Bow presents for L ear pain and sinus pressure x 1 week   pt has not taken a covid test.  No fevers    she is using flonase and antihistamine with little relief .    Past Medical History:  Diagnosis Date   Acute bronchitis    Allergic rhinitis    Allergy    Anemia    Anxiety    Arthritis    knees   Back pain    Glaucoma    Hyperlipidemia    Hypertension    Joint pain    Leg edema    Neuromuscular disorder (HCC)    nerve issues in legs    Prediabetes     Past Surgical History:  Procedure Laterality Date   ABDOMINAL HYSTERECTOMY  1998   BREAST LUMPECTOMY     CESAREAN SECTION     COLONOSCOPY     MYOMECTOMY     POLYPECTOMY      Family History  Problem Relation Age of Onset   Dementia Father    Stroke Father    Atrial fibrillation Father    Alzheimer's disease Father    Hyperlipidemia Father    Hypertension Father    Heart disease Father    Prostate cancer Father 50   Hyperlipidemia Brother    Hypertension Brother    Colon polyps Brother    Hyperlipidemia Brother    Hypertension Brother    Hypertension Brother    Pulmonary embolism Brother    Cancer - Colon Brother 11       colon CA--stage 4   Colon cancer Brother    Colon cancer Cousin 93   Hypertension Mother    Hyperlipidemia Mother    Heart disease Mother    Endometrial cancer Other    Atrial fibrillation Other    Diabetes Maternal Aunt    Diabetes Maternal Uncle    Kidney disease Maternal Uncle        dialysis   Uterine cancer Maternal Grandmother    Cancer Maternal Grandfather        pancreatic   Pancreatic cancer Maternal Grandfather    Esophageal cancer Neg Hx    Rectal cancer Neg Hx     Stomach cancer Neg Hx     Social History   Socioeconomic History   Marital status: Single    Spouse name: Not on file   Number of children: Not on file   Years of education: 20   Highest education level: Not on file  Occupational History   Occupation: Counsellor lab  Tobacco Use   Smoking status: Never   Smokeless tobacco: Never  Vaping Use   Vaping Use: Never used  Substance and Sexual Activity   Alcohol use: Yes    Alcohol/week: 0.0 standard drinks    Comment: occ   Drug use: No   Sexual activity: Yes    Partners: Male  Other Topics Concern   Not on file  Social History Narrative   Exercise-- starting back    Social Determinants of Health   Financial Resource Strain: Not on file  Food Insecurity:  Not on file  Transportation Needs: Not on file  Physical Activity: Not on file  Stress: Not on file  Social Connections: Not on file  Intimate Partner Violence: Not on file    Outpatient Medications Prior to Visit  Medication Sig Dispense Refill   cholecalciferol (VITAMIN D3) 25 MCG (1000 UNIT) tablet Take 1,000 Units by mouth daily.     cyclobenzaprine (FLEXERIL) 10 MG tablet Take 1 tablet by mouth three times daily as needed for muscle spasm 30 tablet 0   diclofenac sodium (VOLTAREN) 1 % GEL APPLY 4 GRAMS  TOPICALLY 4 TIMES DAILY 100 g 0   fluticasone (FLONASE) 50 MCG/ACT nasal spray Place 2 sprays into both nostrils daily. 48 g 1   folic acid (FOLVITE) 1 MG tablet Take 1 tablet (1 mg total) by mouth daily. 90 tablet 3   furosemide (LASIX) 20 MG tablet Take 1 tablet (20 mg total) by mouth daily. 90 tablet 1   Lactobacillus (PROBIOTIC ACIDOPHILUS PO) Take by mouth.     latanoprost (XALATAN) 0.005 % ophthalmic solution      levocetirizine (XYZAL) 5 MG tablet Take 1 tablet (5 mg total) by mouth every evening. 90 tablet 1   lisinopril (ZESTRIL) 10 MG tablet Take 1 tablet (10 mg total) by mouth daily. 90 tablet 1   meloxicam (MOBIC) 15 MG tablet  1/2-1 po qd prn pain 30 tablet 0   Multiple Vitamin (MULTIVITAMIN) tablet Take 1 tablet by mouth daily.     Naproxen Sod-diphenhydrAMINE (ALEVE PM PO) Take 1 tablet by mouth at bedtime as needed.     pravastatin (PRAVACHOL) 40 MG tablet Take 1 tablet by mouth once daily 90 tablet 0   TURMERIC PO Take by mouth.     zinc gluconate 50 MG tablet Take 50 mg by mouth daily.     No facility-administered medications prior to visit.    Allergies  Allergen Reactions   Oseltamivir Phosphate Other (See Comments)    Couldn't walk. Blood shot eyes    ROS Review of Systems  Constitutional:  Negative for activity change, appetite change, fatigue and unexpected weight change.  HENT:  Positive for congestion, ear pain, postnasal drip, sinus pressure and sinus pain. Negative for ear discharge, facial swelling, mouth sores, nosebleeds, rhinorrhea, sneezing, sore throat, tinnitus and trouble swallowing.   Respiratory:  Negative for cough and shortness of breath.   Cardiovascular:  Negative for chest pain and palpitations.  Psychiatric/Behavioral:  Negative for behavioral problems and dysphoric mood. The patient is not nervous/anxious.      Objective:    Physical Exam Vitals and nursing note reviewed.  Constitutional:      Appearance: She is well-developed.  HENT:     Head: Normocephalic and atraumatic.     Right Ear: Hearing, tympanic membrane, ear canal and external ear normal. No middle ear effusion. There is no impacted cerumen.     Left Ear: Hearing, tympanic membrane, ear canal and external ear normal. Tenderness present.  No middle ear effusion.     Nose: Congestion present.     Right Sinus: Maxillary sinus tenderness and frontal sinus tenderness present.     Left Sinus: Maxillary sinus tenderness and frontal sinus tenderness present.  Eyes:     Conjunctiva/sclera: Conjunctivae normal.  Neck:     Thyroid: No thyromegaly.     Vascular: No carotid bruit or JVD.  Cardiovascular:     Rate  and Rhythm: Normal rate and regular rhythm.     Heart sounds: Normal  heart sounds. No murmur heard. Pulmonary:     Effort: Pulmonary effort is normal. No respiratory distress.     Breath sounds: Normal breath sounds. No wheezing or rales.  Chest:     Chest wall: No tenderness.  Musculoskeletal:     Cervical back: Normal range of motion and neck supple.  Neurological:     Mental Status: She is alert and oriented to person, place, and time.    BP 134/86 (BP Location: Right Arm, Patient Position: Sitting, Cuff Size: Large)   Pulse 68   Temp 98 F (36.7 C) (Oral)   Resp 18   Ht '5\' 7"'$  (1.702 m)   Wt 290 lb (131.5 kg)   SpO2 99%   BMI 45.42 kg/m  Wt Readings from Last 3 Encounters:  12/03/20 290 lb (131.5 kg)  07/02/20 283 lb 12.8 oz (128.7 kg)  04/17/20 281 lb 1.9 oz (127.5 kg)     Health Maintenance Due  Topic Date Due   Pneumococcal Vaccine 15-68 Years old (2 - PCV) 05/24/2016   FOOT EXAM  12/21/2019   COVID-19 Vaccine (4 - Booster for Pfizer series) 05/28/2020   OPHTHALMOLOGY EXAM  06/19/2020   INFLUENZA VACCINE  11/19/2020    There are no preventive care reminders to display for this patient.  Lab Results  Component Value Date   TSH 1.23 01/02/2020   Lab Results  Component Value Date   WBC 6.4 04/17/2020   HGB 13.0 04/17/2020   HCT 41.6 04/17/2020   MCV 77.2 (L) 04/17/2020   PLT 199 04/17/2020   Lab Results  Component Value Date   NA 141 07/02/2020   K 4.5 07/02/2020   CO2 34 (H) 07/02/2020   GLUCOSE 74 07/02/2020   BUN 12 07/02/2020   CREATININE 0.83 07/02/2020   BILITOT 0.4 07/02/2020   ALKPHOS 61 07/02/2020   AST 13 07/02/2020   ALT 10 07/02/2020   PROT 7.2 07/02/2020   ALBUMIN 4.1 07/02/2020   CALCIUM 9.9 07/02/2020   ANIONGAP 7 02/16/2020   GFR 76.32 07/02/2020   Lab Results  Component Value Date   CHOL 207 (H) 07/02/2020   Lab Results  Component Value Date   HDL 61.90 07/02/2020   Lab Results  Component Value Date   LDLCALC 117 (H)  07/02/2020   Lab Results  Component Value Date   TRIG 141.0 07/02/2020   Lab Results  Component Value Date   CHOLHDL 3 07/02/2020   Lab Results  Component Value Date   HGBA1C 6.1 07/02/2020      Assessment & Plan:   Problem List Items Addressed This Visit       Unprioritized   Acute non-recurrent pansinusitis - Primary    con't antihistamine and flonase abx per orders       Relevant Medications   amoxicillin-clavulanate (AUGMENTIN) 875-125 MG tablet   Other Relevant Orders   Novel Coronavirus, NAA ([facility])   Acute swimmer's ear of right side   Relevant Medications   ofloxacin (FLOXIN OTIC) 0.3 % OTIC solution    Meds ordered this encounter  Medications   amoxicillin-clavulanate (AUGMENTIN) 875-125 MG tablet    Sig: Take 1 tablet by mouth 2 (two) times daily.    Dispense:  20 tablet    Refill:  0   ofloxacin (FLOXIN OTIC) 0.3 % OTIC solution    Sig: Place 10 drops into the right ear daily.    Dispense:  10 mL    Refill:  0    Follow-up:  Return if symptoms worsen or fail to improve.    Ann Held, DO

## 2020-12-03 NOTE — Telephone Encounter (Signed)
Pt is scheduled a 3:40 PM for ear pain and sinus infection sxs. She was requested to test for covid, but says she is at work and can not test. Sxs have been present for a week or so.

## 2020-12-03 NOTE — Assessment & Plan Note (Signed)
con't antihistamine and flonase abx per orders

## 2020-12-04 LAB — SARS-COV-2, NAA 2 DAY TAT

## 2020-12-04 LAB — NOVEL CORONAVIRUS, NAA: SARS-CoV-2, NAA: NOT DETECTED

## 2021-01-07 ENCOUNTER — Other Ambulatory Visit: Payer: Self-pay

## 2021-01-08 ENCOUNTER — Encounter: Payer: Self-pay | Admitting: Family Medicine

## 2021-01-08 ENCOUNTER — Ambulatory Visit (INDEPENDENT_AMBULATORY_CARE_PROVIDER_SITE_OTHER): Payer: 59 | Admitting: Family Medicine

## 2021-01-08 VITALS — BP 136/80 | HR 71 | Temp 98.8°F | Resp 18 | Ht 67.0 in | Wt 285.6 lb

## 2021-01-08 DIAGNOSIS — E785 Hyperlipidemia, unspecified: Secondary | ICD-10-CM

## 2021-01-08 DIAGNOSIS — R600 Localized edema: Secondary | ICD-10-CM | POA: Diagnosis not present

## 2021-01-08 DIAGNOSIS — Z23 Encounter for immunization: Secondary | ICD-10-CM | POA: Diagnosis not present

## 2021-01-08 DIAGNOSIS — D229 Melanocytic nevi, unspecified: Secondary | ICD-10-CM

## 2021-01-08 DIAGNOSIS — I10 Essential (primary) hypertension: Secondary | ICD-10-CM | POA: Diagnosis not present

## 2021-01-08 DIAGNOSIS — Z0000 Encounter for general adult medical examination without abnormal findings: Secondary | ICD-10-CM

## 2021-01-08 DIAGNOSIS — E119 Type 2 diabetes mellitus without complications: Secondary | ICD-10-CM

## 2021-01-08 LAB — LIPID PANEL
Cholesterol: 190 mg/dL (ref 0–200)
HDL: 59 mg/dL (ref >39.00)
LDL Cholesterol: 114 mg/dL — ABNORMAL HIGH (ref 0–99)
NonHDL: 131.37
Total CHOL/HDL Ratio: 3
Triglycerides: 89 mg/dL (ref 0.0–149.0)
VLDL: 17.8 mg/dL (ref 0.0–40.0)

## 2021-01-08 LAB — COMPREHENSIVE METABOLIC PANEL
ALT: 11 U/L (ref 0–35)
AST: 15 U/L (ref 0–37)
Albumin: 4.1 g/dL (ref 3.5–5.2)
Alkaline Phosphatase: 56 U/L (ref 39–117)
BUN: 13 mg/dL (ref 6–23)
CO2: 31 mEq/L (ref 19–32)
Calcium: 9.6 mg/dL (ref 8.4–10.5)
Chloride: 104 mEq/L (ref 96–112)
Creatinine, Ser: 0.76 mg/dL (ref 0.40–1.20)
GFR: 84.52 mL/min (ref >60.00)
Glucose, Bld: 93 mg/dL (ref 70–99)
Potassium: 4.3 mEq/L (ref 3.5–5.1)
Sodium: 140 mEq/L (ref 135–145)
Total Bilirubin: 0.4 mg/dL (ref 0.2–1.2)
Total Protein: 7 g/dL (ref 6.0–8.3)

## 2021-01-08 LAB — CBC WITH DIFFERENTIAL/PLATELET
Basophils Absolute: 0 10*3/uL (ref 0.0–0.1)
Basophils Relative: 0.7 % (ref 0.0–3.0)
Eosinophils Absolute: 0.1 10*3/uL (ref 0.0–0.7)
Eosinophils Relative: 1.4 % (ref 0.0–5.0)
HCT: 38.5 % (ref 36.0–46.0)
Hemoglobin: 12.4 g/dL (ref 12.0–15.0)
Lymphocytes Relative: 43.3 % (ref 12.0–46.0)
Lymphs Abs: 2.1 10*3/uL (ref 0.7–4.0)
MCHC: 32.2 g/dL (ref 30.0–36.0)
MCV: 76.2 fl — ABNORMAL LOW (ref 78.0–100.0)
Monocytes Absolute: 0.4 10*3/uL (ref 0.1–1.0)
Monocytes Relative: 7.5 % (ref 3.0–12.0)
Neutro Abs: 2.2 10*3/uL (ref 1.4–7.7)
Neutrophils Relative %: 47.1 % (ref 43.0–77.0)
Platelets: 154 10*3/uL (ref 150.0–400.0)
RBC: 5.05 Mil/uL (ref 3.87–5.11)
RDW: 15.7 % — ABNORMAL HIGH (ref 11.5–15.5)
WBC: 4.7 10*3/uL (ref 4.0–10.5)

## 2021-01-08 LAB — HEMOGLOBIN A1C: Hgb A1c MFr Bld: 6.2 % (ref 4.6–6.5)

## 2021-01-08 MED ORDER — FUROSEMIDE 20 MG PO TABS: 20.0000 mg | ORAL_TABLET | Freq: Every day | ORAL | 1 refills | Status: DC

## 2021-01-08 MED ORDER — LISINOPRIL 10 MG PO TABS: 10.0000 mg | ORAL_TABLET | Freq: Every day | ORAL | 1 refills | Status: DC

## 2021-01-08 MED ORDER — PRAVASTATIN SODIUM 40 MG PO TABS: 40.0000 mg | ORAL_TABLET | Freq: Every day | ORAL | 1 refills | Status: DC

## 2021-01-08 NOTE — Assessment & Plan Note (Signed)
Encourage heart healthy diet such as MIND or DASH diet, increase exercise, avoid trans fats, simple carbohydrates and processed foods, consider a krill or fish or flaxseed oil cap daily.  °

## 2021-01-08 NOTE — Assessment & Plan Note (Signed)
Check labs con't diet / work on more exercise

## 2021-01-08 NOTE — Assessment & Plan Note (Signed)
Well controlled, no changes to meds. Encouraged heart healthy diet such as the DASH diet and exercise as tolerated.  °

## 2021-01-08 NOTE — Progress Notes (Signed)
Subjective:   By signing my name below, I, Shehryar Baig, attest that this documentation has been prepared under the direction and in the presence of Ann Held, DO  01/08/2021      Patient ID: Heather [name], female    DOB: [birth year], 60 y.o.   MRN: [id number]  Chief Complaint  Patient presents with  . Annual Exam    Pt states fasting     HPI Patient is in today for a comprehensive physical exam.  She reports having a lesion on her right lower leg that is not healing. She is interested in seeing a dermatologist to manage her lesion.  She is requesting a refill on 40 mg pravastatin daily PO and 20 mg furosemide daily PO.  She continues having knee pain due to arthritis and is taking OTC pain medication to manage her pain. She has tried Voltaren gel and found no relief. She has previously received cortisone injections in that knee to manage her pain.  She denies having any fever, ear pain, muscle pain, congestion, sinus pain, sore throat, eye pain, chest pain, palpations, cough, SOB, wheezing, n/v/d, constipation, blood in stool, dysuria, frequency, hematuria, or headaches at this time. She has no recent changes to her family medical history.  She is getting the flu vaccine during this visit. She has 4 Covid-19 vaccines and is interested in getting the new booster vaccine at a later date.  She is maintaining a healthy diet but is not eating enough calories. She is not regularly exercising at this time. She know about the healthy weight and wellness program but is not interested at this time due to the long distance to the office.  She is UTD on vision care and dental care. She continues seeing her retina specialist, Dr. Nancy Fetter, to manage her glaucoma. Her previous infection has resolved and she is no longer taking Augmentin at this time.    Past Medical History:  Diagnosis Date  . Acute bronchitis   . Allergic rhinitis   . Allergy   . Anemia   . Anxiety   . Arthritis     knees  . Back pain   . Glaucoma   . Hyperlipidemia   . Hypertension   . Joint pain   . Leg edema   . Neuromuscular disorder ([place])    nerve issues in legs   . Prediabetes     Past Surgical History:  Procedure Laterality Date  . ABDOMINAL HYSTERECTOMY  1998  . BREAST LUMPECTOMY    . CESAREAN SECTION    . COLONOSCOPY    . MYOMECTOMY    . POLYPECTOMY      Family History  Problem Relation Age of Onset  . Dementia Father   . Stroke Father   . Atrial fibrillation Father   . Alzheimer's disease Father   . Hyperlipidemia Father   . Hypertension Father   . Heart disease Father   . Prostate cancer Father 73  . Hyperlipidemia Brother   . Hypertension Brother   . Colon polyps Brother   . Hyperlipidemia Brother   . Hypertension Brother   . Hypertension Brother   . Pulmonary embolism Brother   . Cancer - Colon Brother 60       colon CA--stage 4  . Colon cancer Brother   . Colon cancer Cousin 93  . Hypertension Mother   . Hyperlipidemia Mother   . Heart disease Mother   . Endometrial cancer Other   . Atrial fibrillation  Other   . Diabetes Maternal Aunt   . Diabetes Maternal Uncle   . Kidney disease Maternal Uncle        dialysis  . Uterine cancer Maternal Grandmother   . Cancer Maternal Grandfather        pancreatic  . Pancreatic cancer Maternal Grandfather   . Esophageal cancer Neg Hx   . Rectal cancer Neg Hx   . Stomach cancer Neg Hx     Social History   Socioeconomic History  . Marital status: Single    Spouse name: Not on file  . Number of children: Not on file  . Years of education: 57  . Highest education level: Not on file  Occupational History  . Occupation: Counsellor lab  Tobacco Use  . Smoking status: Never  . Smokeless tobacco: Never  Vaping Use  . Vaping Use: Never used  Substance and Sexual Activity  . Alcohol use: Yes    Alcohol/week: 0.0 standard drinks    Comment: occ  . Drug use: No  . Sexual activity: Yes     Partners: Male  Other Topics Concern  . Not on file  Social History Narrative   Exercise-- no   Social Determinants of Health   Financial Resource Strain: Not on file  Food Insecurity: Not on file  Transportation Needs: Not on file  Physical Activity: Not on file  Stress: Not on file  Social Connections: Not on file  Intimate Partner Violence: Not on file    Outpatient Medications Prior to Visit  Medication Sig Dispense Refill  . cholecalciferol (VITAMIN D3) 25 MCG (1000 UNIT) tablet Take 1,000 Units by mouth daily.    . cyclobenzaprine (FLEXERIL) 10 MG tablet Take 1 tablet by mouth three times daily as needed for muscle spasm 30 tablet 0  . diclofenac sodium (VOLTAREN) 1 % GEL APPLY 4 GRAMS  TOPICALLY 4 TIMES DAILY 100 g 0  . fluticasone (FLONASE) 50 MCG/ACT nasal spray Place 2 sprays into both nostrils daily. 48 g 1  . folic acid (FOLVITE) 1 MG tablet Take 1 tablet (1 mg total) by mouth daily. 90 tablet 3  . Lactobacillus (PROBIOTIC ACIDOPHILUS PO) Take by mouth.    . latanoprost (XALATAN) 0.005 % ophthalmic solution     . levocetirizine (XYZAL) 5 MG tablet Take 1 tablet (5 mg total) by mouth every evening. 90 tablet 1  . meloxicam (MOBIC) 15 MG tablet 1/2-1 po qd prn pain 30 tablet 0  . Multiple Vitamin (MULTIVITAMIN) tablet Take 1 tablet by mouth daily.    . Naproxen Sod-diphenhydrAMINE (ALEVE PM PO) Take 1 tablet by mouth at bedtime as needed.    Marland Kitchen ofloxacin (FLOXIN OTIC) 0.3 % OTIC solution Place 10 drops into the right ear daily. 10 mL 0  . TURMERIC PO Take by mouth.    . zinc gluconate 50 MG tablet Take 50 mg by mouth daily.    Marland Kitchen amoxicillin-clavulanate (AUGMENTIN) 875-125 MG tablet Take 1 tablet by mouth 2 (two) times daily. 20 tablet 0  . furosemide (LASIX) 20 MG tablet Take 1 tablet (20 mg total) by mouth daily. 90 tablet 1  . lisinopril (ZESTRIL) 10 MG tablet Take 1 tablet (10 mg total) by mouth daily. 90 tablet 1  . pravastatin (PRAVACHOL) 40 MG tablet Take 1 tablet  by mouth once daily 90 tablet 0   No facility-administered medications prior to visit.    Allergies  Allergen Reactions  . Oseltamivir Phosphate Other (See Comments)  Couldn't walk. Blood shot eyes    Review of Systems  Constitutional:  Negative for fever.  HENT:  Negative for congestion, ear pain, sinus pain and sore throat.   Eyes:  Negative for pain.  Respiratory:  Negative for cough, shortness of breath and wheezing.   Cardiovascular:  Negative for chest pain and palpitations.  Gastrointestinal:  Negative for blood in stool, constipation, diarrhea, nausea and vomiting.  Genitourinary:  Negative for dysuria, frequency and hematuria.  Musculoskeletal:  Positive for joint pain (Knee pain). Negative for myalgias.  Skin:        (+) New lesion on right lower leg that is not healing  Neurological:  Negative for headaches.      Objective:    Physical Exam Constitutional:      General: She is not in acute distress.    Appearance: Normal appearance. She is not ill-appearing.  HENT:     Head: Normocephalic and atraumatic.     Right Ear: Tympanic membrane, ear canal and external ear normal.     Left Ear: Tympanic membrane, ear canal and external ear normal.  Eyes:     Extraocular Movements: Extraocular movements intact.     Pupils: Pupils are equal, round, and reactive to light.  Cardiovascular:     Rate and Rhythm: Normal rate and regular rhythm.     Heart sounds: Normal heart sounds. No murmur heard.   No gallop.  Pulmonary:     Effort: Pulmonary effort is normal. No respiratory distress.     Breath sounds: Normal breath sounds. No wheezing or rales.  Abdominal:     General: Bowel sounds are normal. There is no distension.     Palpations: Abdomen is soft.     Tenderness: There is no abdominal tenderness. There is no guarding.  Skin:    General: Skin is warm and dry.     Findings: Lesion present.     Comments: Hyperpigmented approx 1.5 cm and slightly raised   Neurological:     Mental Status: She is alert and oriented to person, place, and time.  Psychiatric:        Behavior: Behavior normal.    BP 136/80 (BP Location: Left Arm, Patient Position: Sitting, Cuff Size: Large)   Pulse 71   Temp 98.8 F (37.1 C) (Oral)   Resp 18   Ht 5\' 7"  (1.702 m)   Wt 285 lb 9.6 oz (129.5 kg)   SpO2 97%   BMI 44.73 kg/m  Wt Readings from Last 3 Encounters:  01/08/21 285 lb 9.6 oz (129.5 kg)  12/03/20 290 lb (131.5 kg)  07/02/20 283 lb 12.8 oz (128.7 kg)    Diabetic Foot Exam - Simple   No data filed    Lab Results  Component Value Date   WBC 6.4 04/17/2020   HGB 13.0 04/17/2020   HCT 41.6 04/17/2020   PLT 199 04/17/2020   GLUCOSE 74 07/02/2020   CHOL 207 (H) 07/02/2020   TRIG 141.0 07/02/2020   HDL 61.90 07/02/2020   LDLDIRECT 128.0 01/03/2013   LDLCALC 117 (H) 07/02/2020   ALT 10 07/02/2020   AST 13 07/02/2020   NA 141 07/02/2020   K 4.5 07/02/2020   CL 102 07/02/2020   CREATININE 0.83 07/02/2020   BUN 12 07/02/2020   CO2 34 (H) 07/02/2020   TSH 1.23 01/02/2020   HGBA1C 6.1 07/02/2020   MICROALBUR <0.7 07/02/2020    Lab Results  Component Value Date   TSH 1.23 01/02/2020  Lab Results  Component Value Date   WBC 6.4 04/17/2020   HGB 13.0 04/17/2020   HCT 41.6 04/17/2020   MCV 77.2 (L) 04/17/2020   PLT 199 04/17/2020   Lab Results  Component Value Date   NA 141 07/02/2020   K 4.5 07/02/2020   CO2 34 (H) 07/02/2020   GLUCOSE 74 07/02/2020   BUN 12 07/02/2020   CREATININE 0.83 07/02/2020   BILITOT 0.4 07/02/2020   ALKPHOS 61 07/02/2020   AST 13 07/02/2020   ALT 10 07/02/2020   PROT 7.2 07/02/2020   ALBUMIN 4.1 07/02/2020   CALCIUM 9.9 07/02/2020   ANIONGAP 7 02/16/2020   GFR 76.32 07/02/2020   Lab Results  Component Value Date   CHOL 207 (H) 07/02/2020   Lab Results  Component Value Date   HDL 61.90 07/02/2020   Lab Results  Component Value Date   LDLCALC 117 (H) 07/02/2020   Lab Results  Component  Value Date   TRIG 141.0 07/02/2020   Lab Results  Component Value Date   CHOLHDL 3 07/02/2020   Lab Results  Component Value Date   HGBA1C 6.1 07/02/2020   Mammogram- Last completed 11/07/2020. Results are normal. Repeat in 1 year. She does not see a GYN specialist due to her previous hysterectomy.  Colonoscopy- Last completed 03/02/2020. Results showed two 6-7 mm polyps in sigmoid colon which were removed, otherwise results are normal. Repeat in 5 years.  Dexa- Not yet completed. She is interested in getting a bone density scan during her next mammogram appointment.      Assessment & Plan:   Problem List Items Addressed This Visit       Unprioritized   Diet-controlled diabetes mellitus ([place])    Check labs con't diet / work on more exercise      Relevant Medications   pravastatin (PRAVACHOL) 40 MG tablet   lisinopril (ZESTRIL) 10 MG tablet   Other Relevant Orders   CBC with Differential/Platelet   Comprehensive metabolic panel   Hemoglobin A1c   Lipid panel   Essential hypertension    Well controlled, no changes to meds. Encouraged heart healthy diet such as the DASH diet and exercise as tolerated.       Relevant Medications   pravastatin (PRAVACHOL) 40 MG tablet   furosemide (LASIX) 20 MG tablet   lisinopril (ZESTRIL) 10 MG tablet   Other Relevant Orders   CBC with Differential/Platelet   Comprehensive metabolic panel   Hemoglobin A1c   Lipid panel   Hyperlipidemia    Encourage heart healthy diet such as MIND or DASH diet, increase exercise, avoid trans fats, simple carbohydrates and processed foods, consider a krill or fish or flaxseed oil cap daily.       Relevant Medications   pravastatin (PRAVACHOL) 40 MG tablet   furosemide (LASIX) 20 MG tablet   lisinopril (ZESTRIL) 10 MG tablet   Other Relevant Orders   CBC with Differential/Platelet   Comprehensive metabolic panel   Hemoglobin A1c   Lipid panel   Preventative health care - Primary    ghm  utd Check labs  Flu shot given  Pt utd with covid vaccines       Relevant Orders   CBC with Differential/Platelet   Comprehensive metabolic panel   Hemoglobin A1c   Lipid panel   Other Visit Diagnoses     Need for influenza vaccination       Relevant Orders   Flu Vaccine QUAD 38mo+IM ([employer]  PF) (Completed)   Lower extremity edema       Relevant Medications   furosemide (LASIX) 20 MG tablet   Other Relevant Orders   CBC with Differential/Platelet   Comprehensive metabolic panel   Hemoglobin A1c   Lipid panel   Suspicious nevus       Relevant Orders   Ambulatory referral to Dermatology        Meds ordered this encounter  Medications  . pravastatin (PRAVACHOL) 40 MG tablet    Sig: Take 1 tablet (40 mg total) by mouth daily.    Dispense:  90 tablet    Refill:  1  . furosemide (LASIX) 20 MG tablet    Sig: Take 1 tablet (20 mg total) by mouth daily.    Dispense:  90 tablet    Refill:  1  . lisinopril (ZESTRIL) 10 MG tablet    Sig: Take 1 tablet (10 mg total) by mouth daily.    Dispense:  90 tablet    Refill:  1    I, Lowne Chase, [employer], DO, personally preformed the services described in this documentation.  All medical record entries made by the scribe were at my direction and in my presence.  I have reviewed the chart and discharge instructions (if applicable) and agree that the record reflects my personal performance and is accurate and complete. 01/08/2021   I,Shehryar Baig,acting as a scribe for Ann Held, DO.,have documented all relevant documentation on the behalf of Ann Held, DO,as directed by  Ann Held, DO while in the presence of Ann Held, DO.   Ann Held, DO

## 2021-01-08 NOTE — Patient Instructions (Signed)
Preventive Care 40-61 Years Old, Female Preventive care refers to lifestyle choices and visits with your health care provider that can promote health and wellness. This includes: A yearly physical exam. This is also called an annual wellness visit. Regular dental and eye exams. Immunizations. Screening for certain conditions. Healthy lifestyle choices, such as: Eating a healthy diet. Getting regular exercise. Not using drugs or products that contain nicotine and tobacco. Limiting alcohol use. What can I expect for my preventive care visit? Physical exam Your health care provider will check your: Height and weight. These may be used to calculate your BMI (body mass index). BMI is a measurement that tells if you are at a healthy weight. Heart rate and blood pressure. Body temperature. Skin for abnormal spots. Counseling Your health care provider may ask you questions about your: Past medical problems. Family's medical history. Alcohol, tobacco, and drug use. Emotional well-being. Home life and relationship well-being. Sexual activity. Diet, exercise, and sleep habits. Work and work environment. Access to firearms. Method of birth control. Menstrual cycle. Pregnancy history. What immunizations do I need? Vaccines are usually given at various ages, according to a schedule. Your health care provider will recommend vaccines for you based on your age, medical history, and lifestyle or other factors, such as travel or where you work. What tests do I need? Blood tests Lipid and cholesterol levels. These may be checked every 5 years, or more often if you are over 50 years old. Hepatitis C test. Hepatitis B test. Screening Lung cancer screening. You may have this screening every year starting at age 55 if you have a 30-pack-year history of smoking and currently smoke or have quit within the past 15 years. Colorectal cancer screening. All adults should have this screening starting at  age 50 and continuing until age 75. Your health care provider may recommend screening at age 45 if you are at increased risk. You will have tests every 1-10 years, depending on your results and the type of screening test. Diabetes screening. This is done by checking your blood sugar (glucose) after you have not eaten for a while (fasting). You may have this done every 1-3 years. Mammogram. This may be done every 1-2 years. Talk with your health care provider about when you should start having regular mammograms. This may depend on whether you have a family history of breast cancer. BRCA-related cancer screening. This may be done if you have a family history of breast, ovarian, tubal, or peritoneal cancers. Pelvic exam and Pap test. This may be done every 3 years starting at age 21. Starting at age 30, this may be done every 5 years if you have a Pap test in combination with an HPV test. Other tests STD (sexually transmitted disease) testing, if you are at risk. Bone density scan. This is done to screen for osteoporosis. You may have this scan if you are at high risk for osteoporosis. Talk with your health care provider about your test results, treatment options, and if necessary, the need for more tests. Follow these instructions at home: Eating and drinking  Eat a diet that includes fresh fruits and vegetables, whole grains, lean protein, and low-fat dairy products. Take vitamin and mineral supplements as recommended by your health care provider. Do not drink alcohol if: Your health care provider tells you not to drink. You are pregnant, may be pregnant, or are planning to become pregnant. If you drink alcohol: Limit how much you have to 0-1 drink a day. Be   aware of how much alcohol is in your drink. In the [place] one drink equals one 12 oz bottle of beer (355 mL), one 5 oz glass of wine (148 mL), or one 1 oz glass of hard liquor (44 mL). Lifestyle Take daily care of your teeth and  gums. Brush your teeth every morning and night with fluoride toothpaste. Floss one time each day. Stay active. Exercise for at least 30 minutes 5 or more days each week. Do not use any products that contain nicotine or tobacco, such as cigarettes, e-cigarettes, and chewing tobacco. If you need help quitting, ask your health care provider. Do not use drugs. If you are sexually active, practice safe sex. Use a condom or other form of protection to prevent STIs (sexually transmitted infections). If you do not wish to become pregnant, use a form of birth control. If you plan to become pregnant, see your health care provider for a prepregnancy visit. If told by your health care provider, take low-dose aspirin daily starting at age 63. Find healthy ways to cope with stress, such as: Meditation, yoga, or listening to music. Journaling. Talking to a trusted person. Spending time with friends and family. Safety Always wear your seat belt while driving or riding in a vehicle. Do not drive: If you have been drinking alcohol. Do not ride with someone who has been drinking. When you are tired or distracted. While texting. Wear a helmet and other protective equipment during sports activities. If you have firearms in your house, make sure you follow all gun safety procedures. What's next? Visit your health care provider once a year for an annual wellness visit. Ask your health care provider how often you should have your eyes and teeth checked. Stay up to date on all vaccines. This information is not intended to replace advice given to you by your health care provider. Make sure you discuss any questions you have with your health care provider. Document Revised: 06/15/2020 Document Reviewed: 12/17/2017 Elsevier Patient Education  2022 [employer].

## 2021-01-08 NOTE — Assessment & Plan Note (Signed)
ghm utd Check labs  Flu shot given  Pt utd with covid vaccines

## 2021-01-23 ENCOUNTER — Ambulatory Visit: Payer: 59 | Attending: Internal Medicine

## 2021-01-23 DIAGNOSIS — Z23 Encounter for immunization: Secondary | ICD-10-CM

## 2021-01-23 NOTE — Progress Notes (Signed)
   Covid-19 Vaccination Clinic  Name:  Heather [name]    MRN: [id number] DOB: [birth year]  01/23/2021  Heather [name] was observed post Covid-19 immunization for 15 minutes without incident. She was provided with Vaccine Information Sheet and instruction to access the V-Safe system.   Heather [name] was instructed to call 911 with any severe reactions post vaccine: Difficulty breathing  Swelling of face and throat  A fast heartbeat  A bad rash all over body  Dizziness and weakness

## 2021-02-01 ENCOUNTER — Other Ambulatory Visit (HOSPITAL_BASED_OUTPATIENT_CLINIC_OR_DEPARTMENT_OTHER): Payer: Self-pay

## 2021-02-01 MED ORDER — MODERNA COVID-19 BIVAL BOOSTER 50 MCG/0.5ML IM SUSP
INTRAMUSCULAR | 0 refills | Status: DC
  Filled 2021-02-01: qty 0.5, 1d supply, fill #0

## 2021-06-20 ENCOUNTER — Ambulatory Visit: Payer: 59 | Admitting: Family Medicine

## 2021-06-20 ENCOUNTER — Encounter: Payer: Self-pay | Admitting: Family Medicine

## 2021-06-20 VITALS — BP 146/56 | HR 73 | Ht 67.0 in | Wt 283.4 lb

## 2021-06-20 DIAGNOSIS — R600 Localized edema: Secondary | ICD-10-CM

## 2021-06-20 DIAGNOSIS — R051 Acute cough: Secondary | ICD-10-CM

## 2021-06-20 LAB — COMPREHENSIVE METABOLIC PANEL
ALT: 20 U/L (ref 0–35)
AST: 14 U/L (ref 0–37)
Albumin: 4.3 g/dL (ref 3.5–5.2)
Alkaline Phosphatase: 66 U/L (ref 39–117)
BUN: 11 mg/dL (ref 6–23)
CO2: 33 mEq/L — ABNORMAL HIGH (ref 19–32)
Calcium: 9.7 mg/dL (ref 8.4–10.5)
Chloride: 99 mEq/L (ref 96–112)
Creatinine, Ser: 0.85 mg/dL (ref 0.40–1.20)
GFR: 73.67 mL/min (ref >60.00)
Glucose, Bld: 122 mg/dL — ABNORMAL HIGH (ref 70–99)
Potassium: 4.2 mEq/L (ref 3.5–5.1)
Sodium: 138 mEq/L (ref 135–145)
Total Bilirubin: 0.4 mg/dL (ref 0.2–1.2)
Total Protein: 7.1 g/dL (ref 6.0–8.3)

## 2021-06-20 LAB — BRAIN NATRIURETIC PEPTIDE: Pro B Natriuretic peptide (BNP): 15 pg/mL (ref 0.0–100.0)

## 2021-06-20 LAB — CBC
HCT: 38.9 % (ref 36.0–46.0)
Hemoglobin: 12.6 g/dL (ref 12.0–15.0)
MCHC: 32.4 g/dL (ref 30.0–36.0)
MCV: 76.5 fl — ABNORMAL LOW (ref 78.0–100.0)
Platelets: 173 10*3/uL (ref 150.0–400.0)
RBC: 5.08 Mil/uL (ref 3.87–5.11)
RDW: 15.3 % (ref 11.5–15.5)
WBC: 7.7 10*3/uL (ref 4.0–10.5)

## 2021-06-20 NOTE — Progress Notes (Signed)
Bilateral lower leg edema x1.5 weeks ?Cough, worse in afternoon/evening ?

## 2021-06-20 NOTE — Patient Instructions (Addendum)
For cough: ?Checking labs and treating swelling accordingly so this may improve cough as well.  ?Otherwise start taking a daily Zyrtec and Flonase ?Will start reflux medicine to rule this out as a potential cough ?Some people will have cough from lisinopril, so we can keep this in mind, although you have been on this medication for quite awhile and qualities suggest other causes  ? ? ? ?For the swelling: ?Labs today, if kidney function stable will increase lasix for a few days ?Elevating legs, compression socks, avoid sodium, limit water to 60 ounces a day  ?Daily weights - if you gain 2-3 lbs overnight or 5+ lbs in a week, let us know. ?Follow-up in one week   ?

## 2021-06-20 NOTE — Progress Notes (Signed)
Acute Office Visit  Subjective:    Patient ID: Heather [name], female    DOB: February 18, 1961, 61 y.o.   MRN: [id number]  CC: cough and leg swelling    HPI Patient is in today for cough and leg swelling.   Cough -  Started about a week ago Feels like she has something in her chest that gives her the sensation to cough Daily, dry initially then cough sounds more wet by the afternoon  Clear phlegm occasionally No sinus pressure/pain, fevers, ear pain, wheezing, weight loss  No recent colds/allergies, hearburn/indigestion, no worsening symtpoms after meals  Mild dyspnea on exertion    Swelling: Started 1.5-2 weeks ago Both legs/ankles, feel tight/swollen as the day goes on Just recently started elevating legs last week and that has helped some Has been more fatigued and had some mild dyspnea on exertion Takes lasix 20 mg every day Has not noticed any weight increase at home, checking daily No chest pain, wheezing, weeping, rashes, redness, pain       Past Medical History:  Diagnosis Date   Acute bronchitis    Allergic rhinitis    Allergy    Anemia    Anxiety    Arthritis    knees   Back pain    Glaucoma    Hyperlipidemia    Hypertension    Joint pain    Leg edema    Neuromuscular disorder ([place])    nerve issues in legs    Prediabetes     Past Surgical History:  Procedure Laterality Date   ABDOMINAL HYSTERECTOMY  1998   BREAST LUMPECTOMY     CESAREAN SECTION     COLONOSCOPY     MYOMECTOMY     POLYPECTOMY      Family History  Problem Relation Age of Onset   Dementia Father    Stroke Father    Atrial fibrillation Father    Alzheimer's disease Father    Hyperlipidemia Father    Hypertension Father    Heart disease Father    Prostate cancer Father 66   Hyperlipidemia Brother    Hypertension Brother    Colon polyps Brother    Hyperlipidemia Brother    Hypertension Brother    Hypertension Brother    Pulmonary embolism Brother    Cancer - Colon  Brother 36       colon CA--stage 4   Colon cancer Brother    Colon cancer Cousin 70   Hypertension Mother    Hyperlipidemia Mother    Heart disease Mother    Endometrial cancer Other    Atrial fibrillation Other    Diabetes Maternal Aunt    Diabetes Maternal Uncle    Kidney disease Maternal Uncle        dialysis   Uterine cancer Maternal Grandmother    Cancer Maternal Grandfather        pancreatic   Pancreatic cancer Maternal Grandfather    Esophageal cancer Neg Hx    Rectal cancer Neg Hx    Stomach cancer Neg Hx     Social History   Socioeconomic History   Marital status: Single    Spouse name: Not on file   Number of children: Not on file   Years of education: 20   Highest education level: Not on file  Occupational History   Occupation: Counsellor lab  Tobacco Use   Smoking status: Never   Smokeless tobacco: Never  Vaping Use   Vaping Use: Never used  Substance and Sexual Activity   Alcohol use: Yes    Alcohol/week: 0.0 standard drinks    Comment: occ   Drug use: No   Sexual activity: Yes    Partners: Male  Other Topics Concern   Not on file  Social History Narrative   Exercise-- no   Social Determinants of Health   Financial Resource Strain: Not on file  Food Insecurity: Not on file  Transportation Needs: Not on file  Physical Activity: Not on file  Stress: Not on file  Social Connections: Not on file  Intimate Partner Violence: Not on file    Outpatient Medications Prior to Visit  Medication Sig Dispense Refill   cholecalciferol (VITAMIN D3) 25 MCG (1000 UNIT) tablet Take 1,000 Units by mouth daily.     COVID-19 mRNA bivalent vaccine, Moderna, (MODERNA COVID-19 BIVAL BOOSTER) 50 MCG/0.5ML injection Inject into the muscle. 0.5 mL 0   cyclobenzaprine (FLEXERIL) 10 MG tablet Take 1 tablet by mouth three times daily as needed for muscle spasm 30 tablet 0   diclofenac sodium (VOLTAREN) 1 % GEL APPLY 4 GRAMS  TOPICALLY 4 TIMES  DAILY 100 g 0   fluticasone (FLONASE) 50 MCG/ACT nasal spray Place 2 sprays into both nostrils daily. 48 g 1   folic acid (FOLVITE) 1 MG tablet Take 1 tablet (1 mg total) by mouth daily. 90 tablet 3   furosemide (LASIX) 20 MG tablet Take 1 tablet (20 mg total) by mouth daily. 90 tablet 1   Lactobacillus (PROBIOTIC ACIDOPHILUS PO) Take by mouth.     latanoprost (XALATAN) 0.005 % ophthalmic solution      levocetirizine (XYZAL) 5 MG tablet Take 1 tablet (5 mg total) by mouth every evening. 90 tablet 1   lisinopril (ZESTRIL) 10 MG tablet Take 1 tablet (10 mg total) by mouth daily. 90 tablet 1   meloxicam (MOBIC) 15 MG tablet 1/2-1 po qd prn pain 30 tablet 0   Multiple Vitamin (MULTIVITAMIN) tablet Take 1 tablet by mouth daily.     Naproxen Sod-diphenhydrAMINE (ALEVE PM PO) Take 1 tablet by mouth at bedtime as needed.     ofloxacin (FLOXIN OTIC) 0.3 % OTIC solution Place 10 drops into the right ear daily. 10 mL 0   pravastatin (PRAVACHOL) 40 MG tablet Take 1 tablet (40 mg total) by mouth daily. 90 tablet 1   TURMERIC PO Take by mouth.     zinc gluconate 50 MG tablet Take 50 mg by mouth daily.     No facility-administered medications prior to visit.    Allergies  Allergen Reactions   Oseltamivir Phosphate Other (See Comments)    Couldn't walk. Blood shot eyes    Review of Systems All review of systems negative except what is listed in the HPI     Objective:    Physical Exam Vitals reviewed.  Constitutional:      Appearance: Normal appearance. She is obese.  HENT:     Head: Normocephalic and atraumatic.  Cardiovascular:     Rate and Rhythm: Normal rate and regular rhythm.     Pulses: Normal pulses.     Heart sounds: Normal heart sounds.  Pulmonary:     Effort: Pulmonary effort is normal.     Breath sounds: Normal breath sounds. No rhonchi or rales.  Abdominal:     Palpations: Abdomen is soft.  Musculoskeletal:     Right lower leg: Edema present.     Left lower leg: Edema  present.     Comments: BLE  2+ pitting   Skin:    General: Skin is warm and dry.  Neurological:     General: No focal deficit present.     Mental Status: She is alert and oriented to person, place, and time. Mental status is at baseline.  Psychiatric:        Mood and Affect: Mood normal.        Behavior: Behavior normal.        Thought Content: Thought content normal.        Judgment: Judgment normal.    BP (!) 146/56    Pulse 73    Ht 5\' 7"  (1.702 m)    Wt 283 lb 6.4 oz (128.5 kg)    SpO2 100%    BMI 44.39 kg/m  Wt Readings from Last 3 Encounters:  06/20/21 283 lb 6.4 oz (128.5 kg)  01/08/21 285 lb 9.6 oz (129.5 kg)  12/03/20 290 lb (131.5 kg)    Health Maintenance Due  Topic Date Due   FOOT EXAM  12/21/2019    There are no preventive care reminders to display for this patient.   Lab Results  Component Value Date   TSH 1.23 01/02/2020   Lab Results  Component Value Date   WBC 4.7 01/08/2021   HGB 12.4 01/08/2021   HCT 38.5 01/08/2021   MCV 76.2 (L) 01/08/2021   PLT 154.0 01/08/2021   Lab Results  Component Value Date   NA 140 01/08/2021   K 4.3 01/08/2021   CO2 31 01/08/2021   GLUCOSE 93 01/08/2021   BUN 13 01/08/2021   CREATININE 0.76 01/08/2021   BILITOT 0.4 01/08/2021   ALKPHOS 56 01/08/2021   AST 15 01/08/2021   ALT 11 01/08/2021   PROT 7.0 01/08/2021   ALBUMIN 4.1 01/08/2021   CALCIUM 9.6 01/08/2021   ANIONGAP 7 02/16/2020   GFR 84.52 01/08/2021   Lab Results  Component Value Date   CHOL 190 01/08/2021   Lab Results  Component Value Date   HDL 59.00 01/08/2021   Lab Results  Component Value Date   LDLCALC 114 (H) 01/08/2021   Lab Results  Component Value Date   TRIG 89.0 01/08/2021   Lab Results  Component Value Date   CHOLHDL 3 01/08/2021   Lab Results  Component Value Date   HGBA1C 6.2 01/08/2021       Assessment & Plan:   1. Lower extremity edema Labs today, if kidney function stable will increase lasix for a few  days Elevating legs, compression socks, avoid sodium, limit water to 60 ounces a day  Daily weights - if you gain 2-3 lbs overnight or 5+ lbs in a week, let us know. Follow-up in one week   - CBC - Comprehensive metabolic panel - Brain natriuretic peptide  2. Acute cough Checking labs and treating swelling accordingly so this may improve cough as well.  Otherwise start taking a daily Zyrtec and Flonase Will start reflux medicine to rule this out as a potential cough Some people will have cough from lisinopril, so we can keep this in mind, although you have been on this medication for quite awhile and qualities suggest other causes  - CBC - Comprehensive metabolic panel - Brain natriuretic peptide   Follow-up in 1 week to reassess edema and cough.    Terrilyn Saver, NP

## 2021-06-27 ENCOUNTER — Ambulatory Visit (INDEPENDENT_AMBULATORY_CARE_PROVIDER_SITE_OTHER): Payer: 59 | Admitting: Family Medicine

## 2021-06-27 ENCOUNTER — Encounter: Payer: Self-pay | Admitting: Family Medicine

## 2021-06-27 VITALS — BP 137/58 | HR 77 | Ht 67.0 in | Wt 284.0 lb

## 2021-06-27 DIAGNOSIS — R609 Edema, unspecified: Secondary | ICD-10-CM

## 2021-06-27 NOTE — Progress Notes (Signed)
? ?Acute Office Visit ? ?Subjective:  ? ? Patient ID: Heather [name], female    DOB: Oct 15, 1960, 61 y.o.   MRN: [id number] ? ?CC: edema/cough follow-up ? ? ?HPI ?Patient is in today for edema/cough follow-up. ? ?06/20/21 Office visit for BLE and cough. Labs were stable, so lasix was increased for a few days, supportive measures encouraged.  ? ? ?Today she reports swelling is significantly better; cough is improved as well. On home scale she has lost about 6 pounds in the past week; steady on our scale. She denies any chest pain, dyspnea, wheezing, weight gain, fatigue.  ? ? ? ? ?Past Medical History:  ?Diagnosis Date  ? Acute bronchitis   ? Allergic rhinitis   ? Allergy   ? Anemia   ? Anxiety   ? Arthritis   ? knees  ? Back pain   ? Glaucoma   ? Hyperlipidemia   ? Hypertension   ? Joint pain   ? Leg edema   ? Neuromuscular disorder ([place])   ? nerve issues in legs   ? Prediabetes   ? ? ?Past Surgical History:  ?Procedure Laterality Date  ? ABDOMINAL HYSTERECTOMY  1998  ? BREAST LUMPECTOMY    ? CESAREAN SECTION    ? COLONOSCOPY    ? MYOMECTOMY    ? POLYPECTOMY    ? ? ?Family History  ?Problem Relation Age of Onset  ? Dementia Father   ? Stroke Father   ? Atrial fibrillation Father   ? Alzheimer's disease Father   ? Hyperlipidemia Father   ? Hypertension Father   ? Heart disease Father   ? Prostate cancer Father 43  ? Hyperlipidemia Brother   ? Hypertension Brother   ? Colon polyps Brother   ? Hyperlipidemia Brother   ? Hypertension Brother   ? Hypertension Brother   ? Pulmonary embolism Brother   ? Cancer - Colon Brother 52  ?     colon CA--stage 4  ? Colon cancer Brother   ? Colon cancer Cousin 3  ? Hypertension Mother   ? Hyperlipidemia Mother   ? Heart disease Mother   ? Endometrial cancer Other   ? Atrial fibrillation Other   ? Diabetes Maternal Aunt   ? Diabetes Maternal Uncle   ? Kidney disease Maternal Uncle   ?     dialysis  ? Uterine cancer Maternal Grandmother   ? Cancer Maternal Grandfather   ?      pancreatic  ? Pancreatic cancer Maternal Grandfather   ? Esophageal cancer Neg Hx   ? Rectal cancer Neg Hx   ? Stomach cancer Neg Hx   ? ? ?Social History  ? ?Socioeconomic History  ? Marital status: Single  ?  Spouse name: Not on file  ? Number of children: Not on file  ? Years of education: 37  ? Highest education level: Not on file  ?Occupational History  ? Occupation: Counsellor lab  ?Tobacco Use  ? Smoking status: Never  ? Smokeless tobacco: Never  ?Vaping Use  ? Vaping Use: Never used  ?Substance and Sexual Activity  ? Alcohol use: Yes  ?  Alcohol/week: 0.0 standard drinks  ?  Comment: occ  ? Drug use: No  ? Sexual activity: Yes  ?  Partners: Male  ?Other Topics Concern  ? Not on file  ?Social History Narrative  ? Exercise-- no  ? ?Social Determinants of Health  ? ?Financial Resource Strain: Not on file  ?Food  Insecurity: Not on file  ?Transportation Needs: Not on file  ?Physical Activity: Not on file  ?Stress: Not on file  ?Social Connections: Not on file  ?Intimate Partner Violence: Not on file  ? ? ?Outpatient Medications Prior to Visit  ?Medication Sig Dispense Refill  ? cholecalciferol (VITAMIN D3) 25 MCG (1000 UNIT) tablet Take 1,000 Units by mouth daily.    ? COVID-19 mRNA bivalent vaccine, Moderna, (MODERNA COVID-19 BIVAL BOOSTER) 50 MCG/0.5ML injection Inject into the muscle. 0.5 mL 0  ? cyclobenzaprine (FLEXERIL) 10 MG tablet Take 1 tablet by mouth three times daily as needed for muscle spasm 30 tablet 0  ? diclofenac sodium (VOLTAREN) 1 % GEL APPLY 4 GRAMS  TOPICALLY 4 TIMES DAILY 100 g 0  ? fluticasone (FLONASE) 50 MCG/ACT nasal spray Place 2 sprays into both nostrils daily. 48 g 1  ? folic acid (FOLVITE) 1 MG tablet Take 1 tablet (1 mg total) by mouth daily. 90 tablet 3  ? furosemide (LASIX) 20 MG tablet Take 1 tablet (20 mg total) by mouth daily. 90 tablet 1  ? Lactobacillus (PROBIOTIC ACIDOPHILUS PO) Take by mouth.    ? latanoprost (XALATAN) 0.005 % ophthalmic solution      ? levocetirizine (XYZAL) 5 MG tablet Take 1 tablet (5 mg total) by mouth every evening. 90 tablet 1  ? lisinopril (ZESTRIL) 10 MG tablet Take 1 tablet (10 mg total) by mouth daily. 90 tablet 1  ? meloxicam (MOBIC) 15 MG tablet 1/2-1 po qd prn pain 30 tablet 0  ? Multiple Vitamin (MULTIVITAMIN) tablet Take 1 tablet by mouth daily.    ? Naproxen Sod-diphenhydrAMINE (ALEVE PM PO) Take 1 tablet by mouth at bedtime as needed.    ? ofloxacin (FLOXIN OTIC) 0.3 % OTIC solution Place 10 drops into the right ear daily. 10 mL 0  ? pravastatin (PRAVACHOL) 40 MG tablet Take 1 tablet (40 mg total) by mouth daily. 90 tablet 1  ? TURMERIC PO Take by mouth.    ? zinc gluconate 50 MG tablet Take 50 mg by mouth daily.    ? ?No facility-administered medications prior to visit.  ? ? ?Allergies  ?Allergen Reactions  ? Oseltamivir Phosphate Other (See Comments)  ?  Couldn't walk. Blood shot eyes  ? ? ?Review of Systems ?All review of systems negative except what is listed in the HPI ? ?   ?Objective:  ?  ?Physical Exam ?Vitals reviewed.  ?Constitutional:   ?   Appearance: Normal appearance. She is obese.  ?Cardiovascular:  ?   Rate and Rhythm: Normal rate and regular rhythm.  ?Pulmonary:  ?   Effort: Pulmonary effort is normal.  ?   Breath sounds: Normal breath sounds. No wheezing, rhonchi or rales.  ?Musculoskeletal:     ?   General: Normal range of motion.  ?   Right lower leg: No edema.  ?   Left lower leg: No edema.  ?Skin: ?   General: Skin is warm and dry.  ?Neurological:  ?   General: No focal deficit present.  ?   Mental Status: She is alert and oriented to person, place, and time. Mental status is at baseline.  ?Psychiatric:     ?   Mood and Affect: Mood normal.     ?   Behavior: Behavior normal.     ?   Thought Content: Thought content normal.     ?   Judgment: Judgment normal.  ? ? ?BP (!) 137/58   Pulse 77  Ht '5\' 7"'$  (1.702 m)   Wt 284 lb (128.8 kg)   BMI 44.48 kg/m?  ?Wt Readings from Last 3 Encounters:  ?06/27/21 284 lb  (128.8 kg)  ?06/20/21 283 lb 6.4 oz (128.5 kg)  ?01/08/21 285 lb 9.6 oz (129.5 kg)  ? ? ?Health Maintenance Due  ?Topic Date Due  ? FOOT EXAM  12/21/2019  ? ? ?There are no preventive care reminders to display for this patient. ? ? ?Lab Results  ?Component Value Date  ? TSH 1.23 01/02/2020  ? ?Lab Results  ?Component Value Date  ? WBC 7.7 06/20/2021  ? HGB 12.6 06/20/2021  ? HCT 38.9 06/20/2021  ? MCV 76.5 (L) 06/20/2021  ? PLT 173.0 06/20/2021  ? ?Lab Results  ?Component Value Date  ? NA 138 06/20/2021  ? K 4.2 06/20/2021  ? CO2 33 (H) 06/20/2021  ? GLUCOSE 122 (H) 06/20/2021  ? BUN 11 06/20/2021  ? CREATININE 0.85 06/20/2021  ? BILITOT 0.4 06/20/2021  ? ALKPHOS 66 06/20/2021  ? AST 14 06/20/2021  ? ALT 20 06/20/2021  ? PROT 7.1 06/20/2021  ? ALBUMIN 4.3 06/20/2021  ? CALCIUM 9.7 06/20/2021  ? ANIONGAP 7 02/16/2020  ? GFR 73.67 06/20/2021  ? ?Lab Results  ?Component Value Date  ? CHOL 190 01/08/2021  ? ?Lab Results  ?Component Value Date  ? HDL 59.00 01/08/2021  ? ?Lab Results  ?Component Value Date  ? LDLCALC 114 (H) 01/08/2021  ? ?Lab Results  ?Component Value Date  ? TRIG 89.0 01/08/2021  ? ?Lab Results  ?Component Value Date  ? CHOLHDL 3 01/08/2021  ? ?Lab Results  ?Component Value Date  ? HGBA1C 6.2 01/08/2021  ? ? ?   ?Assessment & Plan:  ? ? ?1. Edema, unspecified type ?Cut back to normal lasix done for now. Rechecking labs today to ensure stable since we increased lasix for a few days.  ?Monitor for any signs of fluid overload - swelling, wet cough, shortness of breath, gaining 2-3 lbs over night or 5-6 lbs in a week.  ?Let us know if you notice any of these signs so we can make adjustments before your next follow-up appointment.  ?Continue low-sodium diet, compression socks, elevation, physical activity.  ? ? ?- Basic metabolic panel ? ?Keep upcoming appointment. Follow-up sooner if needed.  ? ?Terrilyn Saver, NP ? ?

## 2021-06-27 NOTE — Patient Instructions (Signed)
Cut back to normal lasix done for now. Rechecking labs today to ensure stable since we increased lasix for a few days.  ?Monitor for any signs of fluid overload - swelling, wet cough, shortness of breath, gaining 2-3 lbs over night or 5-6 lbs in a week.  ?Let us know if you notice any of these signs so we can make adjustments before your next follow-up appointment.  ?Continue low-sodium diet, compression socks, elevation, physical activity.  ?

## 2021-06-28 LAB — BASIC METABOLIC PANEL
BUN: 11 mg/dL (ref 6–23)
CO2: 33 mEq/L — ABNORMAL HIGH (ref 19–32)
Calcium: 9.8 mg/dL (ref 8.4–10.5)
Chloride: 99 mEq/L (ref 96–112)
Creatinine, Ser: 0.76 mg/dL (ref 0.40–1.20)
GFR: 84.25 mL/min (ref >60.00)
Glucose, Bld: 97 mg/dL (ref 70–99)
Potassium: 4.3 mEq/L (ref 3.5–5.1)
Sodium: 139 mEq/L (ref 135–145)

## 2021-07-08 ENCOUNTER — Encounter: Payer: Self-pay | Admitting: Family Medicine

## 2021-07-08 ENCOUNTER — Ambulatory Visit: Payer: 59 | Admitting: Family Medicine

## 2021-07-08 VITALS — BP 130/86 | HR 62 | Temp 98.7°F | Resp 18 | Ht 67.0 in | Wt 281.8 lb

## 2021-07-08 DIAGNOSIS — D560 Alpha thalassemia: Secondary | ICD-10-CM

## 2021-07-08 DIAGNOSIS — R609 Edema, unspecified: Secondary | ICD-10-CM

## 2021-07-08 DIAGNOSIS — Z6841 Body Mass Index (BMI) 40.0 and over, adult: Secondary | ICD-10-CM

## 2021-07-08 DIAGNOSIS — R600 Localized edema: Secondary | ICD-10-CM

## 2021-07-08 DIAGNOSIS — I209 Angina pectoris, unspecified: Secondary | ICD-10-CM

## 2021-07-08 DIAGNOSIS — I10 Essential (primary) hypertension: Secondary | ICD-10-CM | POA: Diagnosis not present

## 2021-07-08 DIAGNOSIS — E785 Hyperlipidemia, unspecified: Secondary | ICD-10-CM

## 2021-07-08 DIAGNOSIS — E1169 Type 2 diabetes mellitus with other specified complication: Secondary | ICD-10-CM

## 2021-07-08 NOTE — Assessment & Plan Note (Signed)
Well controlled, no changes to meds. Encouraged heart healthy diet such as the DASH diet and exercise as tolerated.  °

## 2021-07-08 NOTE — Progress Notes (Signed)
? ?Subjective:  ? ?By signing my name below, I, Heather [name], attest that this documentation has been prepared under the direction and in the presence of [name] DO, 07/08/2021  ? ? Patient ID: Heather [name], female    DOB: April 30, 1960, 61 y.o.   MRN: [id number] ? ?Chief Complaint  ?Patient presents with  ? Hyperlipidemia  ? Hypertension  ? Follow-up  ? ? ?HPI ?Patient is in today for an office visit. ? ?Patient complains of increased bilateral leg swelling. She had symptoms of coughing, feeling lethargic, and chest pain that had recently went away. She denies of any shortness of breath. She feels tightness in her right leg. When she's laying in bed she states that her right leg feels different. She denies of pain in either of her legs.  ? ? ?Past Medical History:  ?Diagnosis Date  ? Acute bronchitis   ? Allergic rhinitis   ? Allergy   ? Anemia   ? Anxiety   ? Arthritis   ? knees  ? Back pain   ? Glaucoma   ? Hyperlipidemia   ? Hypertension   ? Joint pain   ? Leg edema   ? Neuromuscular disorder ([place])   ? nerve issues in legs   ? Prediabetes   ? ? ?Past Surgical History:  ?Procedure Laterality Date  ? ABDOMINAL HYSTERECTOMY  1998  ? BREAST LUMPECTOMY    ? CESAREAN SECTION    ? COLONOSCOPY    ? MYOMECTOMY    ? POLYPECTOMY    ? ? ?Family History  ?Problem Relation Age of Onset  ? Dementia Father   ? Stroke Father   ? Atrial fibrillation Father   ? Alzheimer's disease Father   ? Hyperlipidemia Father   ? Hypertension Father   ? Heart disease Father   ? Prostate cancer Father 65  ? Hyperlipidemia Brother   ? Hypertension Brother   ? Colon polyps Brother   ? Hyperlipidemia Brother   ? Hypertension Brother   ? Hypertension Brother   ? Pulmonary embolism Brother   ? Cancer - Colon Brother 51  ?     colon CA--stage 4  ? Colon cancer Brother   ? Colon cancer Cousin 38  ? Hypertension Mother   ? Hyperlipidemia Mother   ? Heart disease Mother   ? Endometrial cancer Other   ? Atrial fibrillation Other   ?  Diabetes Maternal Aunt   ? Diabetes Maternal Uncle   ? Kidney disease Maternal Uncle   ?     dialysis  ? Uterine cancer Maternal Grandmother   ? Cancer Maternal Grandfather   ?     pancreatic  ? Pancreatic cancer Maternal Grandfather   ? Esophageal cancer Neg Hx   ? Rectal cancer Neg Hx   ? Stomach cancer Neg Hx   ? ? ?Social History  ? ?Socioeconomic History  ? Marital status: Single  ?  Spouse name: Not on file  ? Number of children: Not on file  ? Years of education: 80  ? Highest education level: Not on file  ?Occupational History  ? Occupation: Counsellor lab  ?Tobacco Use  ? Smoking status: Never  ? Smokeless tobacco: Never  ?Vaping Use  ? Vaping Use: Never used  ?Substance and Sexual Activity  ? Alcohol use: Yes  ?  Alcohol/week: 0.0 standard drinks  ?  Comment: occ  ? Drug use: No  ? Sexual activity: Yes  ?  Partners: Male  ?  Other Topics Concern  ? Not on file  ?Social History Narrative  ? Exercise-- no  ? ?Social Determinants of Health  ? ?Financial Resource Strain: Not on file  ?Food Insecurity: Not on file  ?Transportation Needs: Not on file  ?Physical Activity: Not on file  ?Stress: Not on file  ?Social Connections: Not on file  ?Intimate Partner Violence: Not on file  ? ? ?Outpatient Medications Prior to Visit  ?Medication Sig Dispense Refill  ? cholecalciferol (VITAMIN D3) 25 MCG (1000 UNIT) tablet Take 1,000 Units by mouth daily.    ? cyclobenzaprine (FLEXERIL) 10 MG tablet Take 1 tablet by mouth three times daily as needed for muscle spasm 30 tablet 0  ? diclofenac sodium (VOLTAREN) 1 % GEL APPLY 4 GRAMS  TOPICALLY 4 TIMES DAILY 100 g 0  ? fluticasone (FLONASE) 50 MCG/ACT nasal spray Place 2 sprays into both nostrils daily. 48 g 1  ? folic acid (FOLVITE) 1 MG tablet Take 1 tablet (1 mg total) by mouth daily. 90 tablet 3  ? furosemide (LASIX) 20 MG tablet Take 1 tablet (20 mg total) by mouth daily. 90 tablet 1  ? Lactobacillus (PROBIOTIC ACIDOPHILUS PO) Take by mouth.    ?  latanoprost (XALATAN) 0.005 % ophthalmic solution     ? levocetirizine (XYZAL) 5 MG tablet Take 1 tablet (5 mg total) by mouth every evening. 90 tablet 1  ? lisinopril (ZESTRIL) 10 MG tablet Take 1 tablet (10 mg total) by mouth daily. 90 tablet 1  ? meloxicam (MOBIC) 15 MG tablet 1/2-1 po qd prn pain 30 tablet 0  ? Multiple Vitamin (MULTIVITAMIN) tablet Take 1 tablet by mouth daily.    ? Naproxen Sod-diphenhydrAMINE (ALEVE PM PO) Take 1 tablet by mouth at bedtime as needed.    ? ofloxacin (FLOXIN OTIC) 0.3 % OTIC solution Place 10 drops into the right ear daily. 10 mL 0  ? pravastatin (PRAVACHOL) 40 MG tablet Take 1 tablet (40 mg total) by mouth daily. 90 tablet 1  ? TURMERIC PO Take by mouth.    ? zinc gluconate 50 MG tablet Take 50 mg by mouth daily.    ? COVID-19 mRNA bivalent vaccine, Moderna, (MODERNA COVID-19 BIVAL BOOSTER) 50 MCG/0.5ML injection Inject into the muscle. (Patient not taking: Reported on 07/08/2021) 0.5 mL 0  ? ?No facility-administered medications prior to visit.  ? ? ?Allergies  ?Allergen Reactions  ? Oseltamivir Phosphate Other (See Comments)  ?  Couldn't walk. Blood shot eyes  ? ? ?Review of Systems  ?Constitutional:  Negative for fever and malaise/fatigue.  ?HENT:  Negative for congestion.   ?Eyes:  Negative for blurred vision.  ?Respiratory:  Positive for shortness of breath. Negative for cough.   ?Cardiovascular:  Positive for leg swelling. Negative for chest pain and palpitations.  ?Gastrointestinal:  Negative for abdominal pain, blood in stool and nausea.  ?Genitourinary:  Negative for dysuria and frequency.  ?Musculoskeletal:  Negative for falls.  ?     (-) Bilateral leg pain  ?Skin:  Negative for rash.  ?Neurological:  Negative for dizziness, loss of consciousness and headaches.  ?Endo/Heme/Allergies:  Negative for environmental allergies.  ?Psychiatric/Behavioral:  Negative for depression. The patient is not nervous/anxious.   ? ?   ?Objective:  ?  ?Physical Exam ?Vitals and nursing  note reviewed.  ?Constitutional:   ?   General: She is not in acute distress. ?   Appearance: Normal appearance. She is not ill-appearing.  ?HENT:  ?   Head: Normocephalic and  atraumatic.  ?   Right Ear: External ear normal.  ?   Left Ear: External ear normal.  ?Eyes:  ?   Extraocular Movements: Extraocular movements intact.  ?   Pupils: Pupils are equal, round, and reactive to light.  ?Cardiovascular:  ?   Rate and Rhythm: Normal rate and regular rhythm.  ?   Heart sounds: Normal heart sounds. No murmur heard. ?  No gallop.  ?Pulmonary:  ?   Effort: Pulmonary effort is normal. No respiratory distress.  ?   Breath sounds: Normal breath sounds. No stridor. No wheezing, rhonchi or rales.  ?Chest:  ?   Chest wall: No tenderness.  ?Musculoskeletal:  ?   Right lower leg: 2+ Pitting Edema present.  ?   Left lower leg: 2+ Pitting Edema present.  ?Skin: ?   General: Skin is warm and dry.  ?Neurological:  ?   Mental Status: She is alert and oriented to person, place, and time.  ?Psychiatric:     ?   Judgment: Judgment normal.  ? ? ?BP 130/86 (BP Location: Right Arm, Patient Position: Sitting, Cuff Size: Large)   Pulse 62   Temp 98.7 ?F (37.1 ?C) (Oral)   Resp 18   Ht '5\' 7"'$  (1.702 m)   Wt 281 lb 12.8 oz (127.8 kg)   SpO2 98%   BMI 44.14 kg/m?  ?Wt Readings from Last 3 Encounters:  ?07/08/21 281 lb 12.8 oz (127.8 kg)  ?06/27/21 284 lb (128.8 kg)  ?06/20/21 283 lb 6.4 oz (128.5 kg)  ? ? ?Diabetic Foot Exam - Simple   ?No data filed ?  ? ?Lab Results  ?Component Value Date  ? WBC 7.7 06/20/2021  ? HGB 12.6 06/20/2021  ? HCT 38.9 06/20/2021  ? PLT 173.0 06/20/2021  ? GLUCOSE 97 06/27/2021  ? CHOL 190 01/08/2021  ? TRIG 89.0 01/08/2021  ? HDL 59.00 01/08/2021  ? LDLDIRECT 128.0 01/03/2013  ? LDLCALC 114 (H) 01/08/2021  ? ALT 20 06/20/2021  ? AST 14 06/20/2021  ? NA 139 06/27/2021  ? K 4.3 06/27/2021  ? CL 99 06/27/2021  ? CREATININE 0.76 06/27/2021  ? BUN 11 06/27/2021  ? CO2 33 (H) 06/27/2021  ? TSH 1.23 01/02/2020  ?  HGBA1C 6.2 01/08/2021  ? MICROALBUR <0.7 07/02/2020  ? ? ?Lab Results  ?Component Value Date  ? TSH 1.23 01/02/2020  ? ?Lab Results  ?Component Value Date  ? WBC 7.7 06/20/2021  ? HGB 12.6 06/20/2021  ? HCT 38.9 06/20/2021

## 2021-07-08 NOTE — Assessment & Plan Note (Signed)
Per hematology 

## 2021-07-08 NOTE — Assessment & Plan Note (Signed)
Tolerating statin, encouraged heart healthy diet, avoid trans fats, minimize simple carbs and saturated fats. Increase exercise as tolerated 

## 2021-07-08 NOTE — Assessment & Plan Note (Signed)
Compression socks ?Drink plenty of water  ?con't diuretics ?Echo ?rto 3 months or sooner prn ?

## 2021-07-08 NOTE — Patient Instructions (Signed)
Edema °Edema is an abnormal buildup of fluids in the body tissues and under the skin. Swelling of the legs, feet, and ankles is a common symptom that becomes more likely as you get older. Swelling is also common in looser tissues, like around the eyes. When the affected area is squeezed, the fluid may move out of that spot and leave a dent for a few moments. This dent is called pitting edema. °There are many possible causes of edema. Eating too much salt (sodium) and being on your feet or sitting for a long time can cause edema in your legs, feet, and ankles. Hot weather may make edema worse. Common causes of edema include: °Heart failure. °Liver or kidney disease. °Weak leg blood vessels. °Cancer. °An injury. °Pregnancy. °Medicines. °Being obese. °Low protein levels in the blood. °Edema is usually painless. Your skin may look swollen or shiny. °Follow these instructions at home: °Keep the affected body part raised (elevated) above the level of your heart when you are sitting or lying down. °Do not sit still or stand for long periods of time. °Do not wear tight clothing. Do not wear garters on your upper legs. °Exercise your legs to get your circulation going. This helps to move the fluid back into your blood vessels, and it may help the swelling go down. °Wear elastic bandages or support stockings to reduce swelling as told by your health care provider. °Eat a low-salt (low-sodium) diet to reduce fluid as told by your health care provider. °Depending on the cause of your swelling, you may need to limit how much fluid you drink (fluid restriction). °Take over-the-counter and prescription medicines only as told by your health care provider. °Contact a health care provider if: °Your edema does not get better with treatment. °You have heart, liver, or kidney disease and have symptoms of edema. °You have sudden and unexplained weight gain. °Get help right away if: °You develop shortness of breath or chest pain. °You  cannot breathe when you lie down. °You develop pain, redness, or warmth in the swollen areas. °You have heart, liver, or kidney disease and suddenly get edema. °You have a fever and your symptoms suddenly get worse. °Summary °Edema is an abnormal buildup of fluids in the body tissues and under the skin. °Eating too much salt (sodium) and being on your feet or sitting for a long time can cause edema in your legs, feet, and ankles. °Keep the affected body part raised (elevated) above the level of your heart when you are sitting or lying down. °This information is not intended to replace advice given to you by your health care provider. Make sure you discuss any questions you have with your health care provider. °Document Revised: 09/06/2020 Document Reviewed: 01/31/2020 °Elsevier Patient Education © 2022 [employer] ° °

## 2021-08-07 ENCOUNTER — Other Ambulatory Visit: Payer: Self-pay | Admitting: Family Medicine

## 2021-08-07 DIAGNOSIS — E785 Hyperlipidemia, unspecified: Secondary | ICD-10-CM

## 2021-09-03 ENCOUNTER — Ambulatory Visit (HOSPITAL_BASED_OUTPATIENT_CLINIC_OR_DEPARTMENT_OTHER)
Admission: RE | Admit: 2021-09-03 | Discharge: 2021-09-03 | Disposition: A | Discharge status: Home / Self Care | Payer: 59 | Source: Ambulatory Visit | Attending: Family Medicine | Admitting: Family Medicine

## 2021-09-03 DIAGNOSIS — R600 Localized edema: Secondary | ICD-10-CM | POA: Diagnosis not present

## 2021-09-03 LAB — ECHOCARDIOGRAM COMPLETE
AR max vel: 2.45 cm2
AV Area VTI: 2.26 cm2
AV Area mean vel: 2.24 cm2
AV Mean grad: 6 mmHg
AV Peak grad: 10.2 mmHg
Ao pk vel: 1.6 m/s
Area-P 1/2: 2.9 cm2
S' Lateral: 2.8 cm

## 2021-09-03 NOTE — Progress Notes (Signed)
?  Echocardiogram ?2D Echocardiogram has been performed. ? ?Elmer Ramp ?09/03/2021, 3:51 PM ?

## 2021-11-13 LAB — HM MAMMOGRAPHY

## 2021-11-25 ENCOUNTER — Other Ambulatory Visit: Payer: Self-pay | Admitting: Family Medicine

## 2021-11-25 DIAGNOSIS — E785 Hyperlipidemia, unspecified: Secondary | ICD-10-CM

## 2021-12-14 ENCOUNTER — Other Ambulatory Visit: Payer: Self-pay | Admitting: Family Medicine

## 2021-12-14 DIAGNOSIS — I10 Essential (primary) hypertension: Secondary | ICD-10-CM

## 2021-12-26 ENCOUNTER — Other Ambulatory Visit: Payer: Self-pay | Admitting: Family Medicine

## 2021-12-26 DIAGNOSIS — R600 Localized edema: Secondary | ICD-10-CM

## 2022-03-11 ENCOUNTER — Other Ambulatory Visit: Payer: Self-pay | Admitting: Family Medicine

## 2022-03-11 DIAGNOSIS — E785 Hyperlipidemia, unspecified: Secondary | ICD-10-CM

## 2022-03-29 ENCOUNTER — Other Ambulatory Visit: Payer: Self-pay | Admitting: Family Medicine

## 2022-03-29 DIAGNOSIS — R600 Localized edema: Secondary | ICD-10-CM

## 2022-04-16 ENCOUNTER — Other Ambulatory Visit: Payer: Self-pay | Admitting: Family Medicine

## 2022-04-16 DIAGNOSIS — I10 Essential (primary) hypertension: Secondary | ICD-10-CM

## 2022-04-18 ENCOUNTER — Other Ambulatory Visit (HOSPITAL_BASED_OUTPATIENT_CLINIC_OR_DEPARTMENT_OTHER): Payer: Self-pay

## 2022-04-18 ENCOUNTER — Encounter: Payer: Self-pay | Admitting: Family Medicine

## 2022-04-18 ENCOUNTER — Ambulatory Visit: Payer: 59 | Admitting: Family Medicine

## 2022-04-18 VITALS — BP 120/88 | HR 83 | Temp 98.0°F | Resp 18 | Ht 67.0 in | Wt 272.8 lb

## 2022-04-18 DIAGNOSIS — R600 Localized edema: Secondary | ICD-10-CM | POA: Diagnosis not present

## 2022-04-18 DIAGNOSIS — R102 Pelvic and perineal pain: Secondary | ICD-10-CM | POA: Diagnosis not present

## 2022-04-18 DIAGNOSIS — I10 Essential (primary) hypertension: Secondary | ICD-10-CM | POA: Diagnosis not present

## 2022-04-18 DIAGNOSIS — E119 Type 2 diabetes mellitus without complications: Secondary | ICD-10-CM

## 2022-04-18 DIAGNOSIS — K047 Periapical abscess without sinus: Secondary | ICD-10-CM

## 2022-04-18 DIAGNOSIS — E785 Hyperlipidemia, unspecified: Secondary | ICD-10-CM | POA: Insufficient documentation

## 2022-04-18 DIAGNOSIS — Z6841 Body Mass Index (BMI) 40.0 and over, adult: Secondary | ICD-10-CM

## 2022-04-18 DIAGNOSIS — E669 Obesity, unspecified: Secondary | ICD-10-CM

## 2022-04-18 DIAGNOSIS — E1169 Type 2 diabetes mellitus with other specified complication: Secondary | ICD-10-CM

## 2022-04-18 LAB — POC URINALSYSI DIPSTICK (AUTOMATED)
Bilirubin, UA: NEGATIVE
Blood, UA: NEGATIVE
Glucose, UA: NEGATIVE
Ketones, UA: NEGATIVE
Nitrite, UA: NEGATIVE
Protein, UA: NEGATIVE
Spec Grav, UA: 1.02 (ref 1.010–1.025)
Urobilinogen, UA: 0.2 E.U./dL
pH, UA: 5 (ref 5.0–8.0)

## 2022-04-18 MED ORDER — AMOXICILLIN-POT CLAVULANATE 875-125 MG PO TABS
1.0000 | ORAL_TABLET | Freq: Two times a day (BID) | ORAL | 0 refills | Status: DC
  Filled 2022-04-18: qty 20, 10d supply, fill #0

## 2022-04-18 MED ORDER — ZEPBOUND 2.5 MG/0.5ML ~~LOC~~ SOAJ
2.5000 mg | SUBCUTANEOUS | 1 refills | Status: DC
  Filled 2022-04-18: qty 2, 28d supply, fill #0

## 2022-04-18 NOTE — Assessment & Plan Note (Signed)
Pt will start back on diet and exercise

## 2022-04-18 NOTE — Assessment & Plan Note (Signed)
stable °

## 2022-04-18 NOTE — Progress Notes (Signed)
Subjective:   By signing my name below, I, Heather [name], attest that this documentation has been prepared under the direction and in the presence of Heather [name], 04/18/2022.   Patient ID: Heather [name], female    DOB: 10/17/1960, 61 y.o.   MRN: [id number]  Chief Complaint  Patient presents with   Hypertension   Hyperlipidemia   Edema   Follow-up   Abdominal Pain    X1 month, no urinary freq or burning.    HPI Patient is in today for an office visit.  Lower Abdominal Pain Patient is complaining of constant soreness/pain in her lower abdominal region occurring a month ago. She states that it does not matter if she is standing, sitting, or lying down. Patient expresses that the pain can have her clutching onto stomach at times. There is slight soreness upon palpation. She confirms normal bowel movements and denies vaginal discharge and a burning sensation with urination.   Hypertension Patients blood pressure is normal this visit. She is complaint with her 10 mg lisinopril.  Hyperlipidemia Patient is complaint with 40 mg Pravachol.  Edema Patient is complaint with 20 mg Lasix.  Lump Patient is complaining of a painful lump on the left side of her gums. She states that the lump has gotten smaller since it first appeared.  Health Maintenance Due  Topic Date Due   FOOT EXAM  12/21/2019   Diabetic kidney evaluation - Urine ACR  07/02/2021   HEMOGLOBIN A1C  07/08/2021   OPHTHALMOLOGY EXAM  10/26/2021   INFLUENZA VACCINE  11/19/2021   COVID-19 Vaccine (5 - 2023-24 season) 12/20/2021    Past Medical History:  Diagnosis Date   Acute bronchitis    Allergic rhinitis    Allergy    Anemia    Anxiety    Arthritis    knees   Back pain    Glaucoma    Hyperlipidemia    Hypertension    Joint pain    Leg edema    Neuromuscular disorder ([place])    nerve issues in legs    Prediabetes     Past Surgical History:  Procedure Laterality Date   ABDOMINAL HYSTERECTOMY   1998   BREAST LUMPECTOMY     CESAREAN SECTION     COLONOSCOPY     MYOMECTOMY     POLYPECTOMY      Family History  Problem Relation Age of Onset   Dementia Father    Stroke Father    Atrial fibrillation Father    Alzheimer's disease Father    Hyperlipidemia Father    Hypertension Father    Heart disease Father    Prostate cancer Father 58   Hyperlipidemia Brother    Hypertension Brother    Colon polyps Brother    Hyperlipidemia Brother    Hypertension Brother    Hypertension Brother    Pulmonary embolism Brother    Cancer - Colon Brother 53       colon CA--stage 4   Colon cancer Brother    Colon cancer Cousin 34   Hypertension Mother    Hyperlipidemia Mother    Heart disease Mother    Endometrial cancer Other    Atrial fibrillation Other    Diabetes Maternal Aunt    Diabetes Maternal Uncle    Kidney disease Maternal Uncle        dialysis   Uterine cancer Maternal Grandmother    Cancer Maternal Grandfather        pancreatic  Pancreatic cancer Maternal Grandfather    Esophageal cancer Neg Hx    Rectal cancer Neg Hx    Stomach cancer Neg Hx     Social History   Socioeconomic History   Marital status: Single    Spouse name: Not on file   Number of children: Not on file   Years of education: 20   Highest education level: Not on file  Occupational History   Occupation: Counsellor lab  Tobacco Use   Smoking status: Never   Smokeless tobacco: Never  Vaping Use   Vaping Use: Never used  Substance and Sexual Activity   Alcohol use: Yes    Alcohol/week: 0.0 standard drinks of alcohol    Comment: occ   Drug use: No   Sexual activity: Yes    Partners: Male  Other Topics Concern   Not on file  Social History Narrative   Exercise-- no   Social Determinants of Health   Financial Resource Strain: Not on file  Food Insecurity: Not on file  Transportation Needs: Not on file  Physical Activity: Not on file  Stress: Not on file   Social Connections: Not on file  Intimate Partner Violence: Not on file    Outpatient Medications Prior to Visit  Medication Sig Dispense Refill   cholecalciferol (VITAMIN D3) 25 MCG (1000 UNIT) tablet Take 1,000 Units by mouth daily.     cyclobenzaprine (FLEXERIL) 10 MG tablet Take 1 tablet by mouth three times daily as needed for muscle spasm 30 tablet 0   diclofenac sodium (VOLTAREN) 1 % GEL APPLY 4 GRAMS  TOPICALLY 4 TIMES DAILY 100 g 0   fluticasone (FLONASE) 50 MCG/ACT nasal spray Place 2 sprays into both nostrils daily. 48 g 1   folic acid (FOLVITE) 1 MG tablet Take 1 tablet (1 mg total) by mouth daily. 90 tablet 3   furosemide (LASIX) 20 MG tablet Take 1 tablet by mouth once daily 90 tablet 0   Lactobacillus (PROBIOTIC ACIDOPHILUS PO) Take by mouth.     latanoprost (XALATAN) 0.005 % ophthalmic solution      levocetirizine (XYZAL) 5 MG tablet Take 1 tablet (5 mg total) by mouth every evening. 90 tablet 1   lisinopril (ZESTRIL) 10 MG tablet Take 1 tablet by mouth once daily 90 tablet 0   meloxicam (MOBIC) 15 MG tablet 1/2-1 po qd prn pain 30 tablet 0   Multiple Vitamin (MULTIVITAMIN) tablet Take 1 tablet by mouth daily.     Naproxen Sod-diphenhydrAMINE (ALEVE PM PO) Take 1 tablet by mouth at bedtime as needed.     ofloxacin (FLOXIN OTIC) 0.3 % OTIC solution Place 10 drops into the right ear daily. 10 mL 0   pravastatin (PRAVACHOL) 40 MG tablet Take 1 tablet (40 mg total) by mouth daily. 30 tablet 0   TURMERIC PO Take by mouth.     zinc gluconate 50 MG tablet Take 50 mg by mouth daily.     No facility-administered medications prior to visit.    Allergies  Allergen Reactions   Oseltamivir Phosphate Other (See Comments)    Couldn't walk. Blood shot eyes    Review of Systems  Constitutional:  Negative for chills, fever and malaise/fatigue.  HENT:  Negative for congestion and hearing loss.   Eyes:  Negative for discharge.  Respiratory:  Negative for cough, sputum production  and shortness of breath.   Cardiovascular:  Negative for chest pain, palpitations and leg swelling.  Gastrointestinal:  Positive for abdominal pain (low).  Negative for blood in stool, constipation, diarrhea, heartburn, nausea and vomiting.  Genitourinary:  Negative for dysuria, frequency, hematuria and urgency.  Musculoskeletal:  Negative for back pain, falls and myalgias.  Skin:  Negative for rash.       (+) lump on left side of gum  Neurological:  Negative for dizziness, sensory change, loss of consciousness, weakness and headaches.  Endo/Heme/Allergies:  Negative for environmental allergies. Does not bruise/bleed easily.  Psychiatric/Behavioral:  Negative for depression and suicidal ideas. The patient is not nervous/anxious and does not have insomnia.        Objective:    Physical Exam Vitals and nursing note reviewed.  Constitutional:      General: She is not in acute distress.    Appearance: Normal appearance. She is well-developed. She is not ill-appearing.  HENT:     Head: Normocephalic and atraumatic.     Right Ear: External ear normal.     Left Ear: External ear normal.  Eyes:     Extraocular Movements: Extraocular movements intact.     Conjunctiva/sclera: Conjunctivae normal.     Pupils: Pupils are equal, round, and reactive to light.  Neck:     Thyroid: No thyromegaly.     Vascular: No carotid bruit or JVD.  Cardiovascular:     Rate and Rhythm: Normal rate and regular rhythm.     Heart sounds: Normal heart sounds. No murmur heard.    No gallop.  Pulmonary:     Effort: Pulmonary effort is normal. No respiratory distress.     Breath sounds: Normal breath sounds. No wheezing or rales.  Chest:     Chest wall: No tenderness.  Abdominal:     Tenderness: There is abdominal tenderness in the suprapubic area. There is no guarding or rebound. Negative signs include Murphy's sign and Rovsing's sign.  Musculoskeletal:     Cervical back: Normal range of motion and neck  supple.  Skin:    General: Skin is warm and dry.  Neurological:     Mental Status: She is alert and oriented to person, place, and time.  Psychiatric:        Judgment: Judgment normal.     BP 120/88 (BP Location: Right Arm, Patient Position: Sitting, Cuff Size: Large)   Pulse 83   Temp 98 F (36.7 C) (Oral)   Resp 18   Ht '5\' 7"'$  (1.702 m)   Wt 272 lb 12.8 oz (123.7 kg)   SpO2 98%   BMI 42.73 kg/m  Wt Readings from Last 3 Encounters:  04/18/22 272 lb 12.8 oz (123.7 kg)  07/08/21 281 lb 12.8 oz (127.8 kg)  06/27/21 284 lb (128.8 kg)       Assessment & Plan:   Problem List Items Addressed This Visit       Unprioritized   Suprapubic pain    Check urine Abx per orders  Culture pending  Consider US pelvis       Relevant Medications   amoxicillin-clavulanate (AUGMENTIN) 875-125 MG tablet   Other Relevant Orders   POCT Urinalysis Dipstick (Automated) (Completed)   Urine Culture   Primary hypertension    Well controlled, no changes to meds. Encouraged heart healthy diet such as the DASH diet and exercise as tolerated.        Obesity (BMI 30-39.9)    Pt will start back on diet and exercise       Relevant Medications   tirzepatide (ZEPBOUND) 2.5 MG/0.5ML Pen   Lower extremity edema  stable      Relevant Orders   CBC with Differential/Platelet   Comprehensive metabolic panel   Lipid panel   TSH   Hyperlipidemia associated with type 2 diabetes mellitus ([place])    Encourage heart healthy diet such as MIND or DASH diet, increase exercise, avoid trans fats, simple carbohydrates and processed foods, consider a krill or fish or flaxseed oil cap daily.        Hyperlipidemia - Primary   Relevant Orders   CBC with Differential/Platelet   Comprehensive metabolic panel   Lipid panel   TSH   Diet-controlled diabetes mellitus ([place])    Pt will start back on diet and exercise      Class 3 severe obesity with serious comorbidity and body mass index (BMI) of 40.0 to  44.9 in adult, unspecified obesity type ([facility])   Relevant Medications   tirzepatide (ZEPBOUND) 2.5 MG/0.5ML Pen   Other Relevant Orders   CBC with Differential/Platelet   Comprehensive metabolic panel   Lipid panel   TSH   Abscessed tooth   Meds ordered this encounter  Medications   amoxicillin-clavulanate (AUGMENTIN) 875-125 MG tablet    Sig: Take 1 tablet by mouth 2 (two) times daily.    Dispense:  20 tablet    Refill:  0   tirzepatide (ZEPBOUND) 2.5 MG/0.5ML Pen    Sig: Inject 2.5 mg into the skin once a week.    Dispense:  2 mL    Refill:  1    I, Heather [name], personally preformed the services described in this documentation.  All medical record entries made by the scribe were at my direction and in my presence.  I have reviewed the chart and discharge instructions (if applicable) and agree that the record reflects my personal performance and is accurate and complete. 04/18/2022.   I,Verona Buck,acting as a Education administrator for [employer], DO.,have documented all relevant documentation on the behalf of Ann Held, DO,as directed by  Ann Held, DO while in the presence of Ann Held, DO.    Ann Held, DO

## 2022-04-18 NOTE — Assessment & Plan Note (Signed)
Encourage heart healthy diet such as MIND or DASH diet, increase exercise, avoid trans fats, simple carbohydrates and processed foods, consider a krill or fish or flaxseed oil cap daily.  °

## 2022-04-18 NOTE — Assessment & Plan Note (Signed)
Well controlled, no changes to meds. Encouraged heart healthy diet such as the DASH diet and exercise as tolerated.  °

## 2022-04-18 NOTE — Assessment & Plan Note (Signed)
Check urine Abx per orders  Culture pending  Consider US pelvis

## 2022-04-18 NOTE — Patient Instructions (Signed)

## 2022-04-19 LAB — CBC WITH DIFFERENTIAL/PLATELET
Absolute Monocytes: 449 cells/uL (ref 200–950)
Basophils Absolute: 62 cells/uL (ref 0–200)
Basophils Relative: 0.9 %
Eosinophils Absolute: 83 cells/uL (ref 15–500)
Eosinophils Relative: 1.2 %
HCT: 39.7 % (ref 35.0–45.0)
Hemoglobin: 12.8 g/dL (ref 11.7–15.5)
Lymphs Abs: 3278 cells/uL (ref 850–3900)
MCH: 24.9 pg — ABNORMAL LOW (ref 27.0–33.0)
MCHC: 32.2 g/dL (ref 32.0–36.0)
MCV: 77.1 fL — ABNORMAL LOW (ref 80.0–100.0)
MPV: 12 fL (ref 7.5–12.5)
Monocytes Relative: 6.5 %
Neutro Abs: 3029 cells/uL (ref 1500–7800)
Neutrophils Relative %: 43.9 %
Platelets: 173 10*3/uL (ref 140–400)
RBC: 5.15 10*6/uL — ABNORMAL HIGH (ref 3.80–5.10)
RDW: 14.7 % (ref 11.0–15.0)
Total Lymphocyte: 47.5 %
WBC: 6.9 10*3/uL (ref 3.8–10.8)

## 2022-04-19 LAB — COMPREHENSIVE METABOLIC PANEL
AG Ratio: 1.5 (calc) (ref 1.0–2.5)
ALT: 13 U/L (ref 6–29)
AST: 12 U/L (ref 10–35)
Albumin: 4.3 g/dL (ref 3.6–5.1)
Alkaline phosphatase (APISO): 72 U/L (ref 37–153)
BUN: 15 mg/dL (ref 7–25)
CO2: 29 mmol/L (ref 20–32)
Calcium: 9.7 mg/dL (ref 8.6–10.4)
Chloride: 106 mmol/L (ref 98–110)
Creat: 0.71 mg/dL (ref 0.50–1.05)
Globulin: 2.9 g/dL (calc) (ref 1.9–3.7)
Glucose, Bld: 90 mg/dL (ref 65–99)
Potassium: 4.6 mmol/L (ref 3.5–5.3)
Sodium: 143 mmol/L (ref 135–146)
Total Bilirubin: 0.3 mg/dL (ref 0.2–1.2)
Total Protein: 7.2 g/dL (ref 6.1–8.1)

## 2022-04-19 LAB — URINE CULTURE
MICRO NUMBER:: 14370380
SPECIMEN QUALITY:: ADEQUATE

## 2022-04-19 LAB — LIPID PANEL
Cholesterol: 200 mg/dL — ABNORMAL HIGH (ref <200)
HDL: 60 mg/dL (ref >=50)
LDL Cholesterol (Calc): 118 mg/dL (calc) — ABNORMAL HIGH
Non-HDL Cholesterol (Calc): 140 mg/dL (calc) — ABNORMAL HIGH (ref <130)
Total CHOL/HDL Ratio: 3.3 (calc) (ref <5.0)
Triglycerides: 108 mg/dL (ref <150)

## 2022-04-19 LAB — TSH: TSH: 1.97 mIU/L (ref 0.40–4.50)

## 2022-04-22 ENCOUNTER — Other Ambulatory Visit: Payer: Self-pay | Admitting: Family Medicine

## 2022-04-22 DIAGNOSIS — E785 Hyperlipidemia, unspecified: Secondary | ICD-10-CM

## 2022-05-21 ENCOUNTER — Other Ambulatory Visit: Payer: Self-pay | Admitting: Family Medicine

## 2022-05-21 DIAGNOSIS — E785 Hyperlipidemia, unspecified: Secondary | ICD-10-CM

## 2022-07-02 ENCOUNTER — Other Ambulatory Visit: Payer: Self-pay | Admitting: Family Medicine

## 2022-07-02 DIAGNOSIS — R600 Localized edema: Secondary | ICD-10-CM

## 2022-07-16 ENCOUNTER — Other Ambulatory Visit: Payer: Self-pay | Admitting: Family Medicine

## 2022-07-16 DIAGNOSIS — I10 Essential (primary) hypertension: Secondary | ICD-10-CM

## 2022-07-17 ENCOUNTER — Other Ambulatory Visit (HOSPITAL_BASED_OUTPATIENT_CLINIC_OR_DEPARTMENT_OTHER): Payer: Self-pay

## 2022-07-17 ENCOUNTER — Ambulatory Visit (INDEPENDENT_AMBULATORY_CARE_PROVIDER_SITE_OTHER): Payer: 59 | Admitting: Family Medicine

## 2022-07-17 ENCOUNTER — Encounter: Payer: Self-pay | Admitting: Family Medicine

## 2022-07-17 VITALS — BP 118/80 | HR 71 | Temp 98.7°F | Resp 18 | Ht 67.0 in | Wt 269.6 lb

## 2022-07-17 DIAGNOSIS — E785 Hyperlipidemia, unspecified: Secondary | ICD-10-CM

## 2022-07-17 DIAGNOSIS — I10 Essential (primary) hypertension: Secondary | ICD-10-CM | POA: Diagnosis not present

## 2022-07-17 DIAGNOSIS — Z0000 Encounter for general adult medical examination without abnormal findings: Secondary | ICD-10-CM | POA: Diagnosis not present

## 2022-07-17 DIAGNOSIS — E2839 Other primary ovarian failure: Secondary | ICD-10-CM

## 2022-07-17 DIAGNOSIS — E119 Type 2 diabetes mellitus without complications: Secondary | ICD-10-CM | POA: Diagnosis not present

## 2022-07-17 LAB — CBC WITH DIFFERENTIAL/PLATELET
Basophils Absolute: 0.1 10*3/uL (ref 0.0–0.1)
Basophils Relative: 1 % (ref 0.0–3.0)
Eosinophils Absolute: 0.1 10*3/uL (ref 0.0–0.7)
Eosinophils Relative: 2 % (ref 0.0–5.0)
HCT: 41.3 % (ref 36.0–46.0)
Hemoglobin: 13.4 g/dL (ref 12.0–15.0)
Lymphocytes Relative: 44.2 % (ref 12.0–46.0)
Lymphs Abs: 2.4 10*3/uL (ref 0.7–4.0)
MCHC: 32.4 g/dL (ref 30.0–36.0)
MCV: 77.8 fl — ABNORMAL LOW (ref 78.0–100.0)
Monocytes Absolute: 0.3 10*3/uL (ref 0.1–1.0)
Monocytes Relative: 6 % (ref 3.0–12.0)
Neutro Abs: 2.5 10*3/uL (ref 1.4–7.7)
Neutrophils Relative %: 46.8 % (ref 43.0–77.0)
Platelets: 188 10*3/uL (ref 150.0–400.0)
RBC: 5.31 Mil/uL — ABNORMAL HIGH (ref 3.87–5.11)
RDW: 15.5 % (ref 11.5–15.5)
WBC: 5.3 10*3/uL (ref 4.0–10.5)

## 2022-07-17 LAB — COMPREHENSIVE METABOLIC PANEL
ALT: 14 U/L (ref 0–35)
AST: 15 U/L (ref 0–37)
Albumin: 4.2 g/dL (ref 3.5–5.2)
Alkaline Phosphatase: 65 U/L (ref 39–117)
BUN: 15 mg/dL (ref 6–23)
CO2: 33 mEq/L — ABNORMAL HIGH (ref 19–32)
Calcium: 9.8 mg/dL (ref 8.4–10.5)
Chloride: 101 mEq/L (ref 96–112)
Creatinine, Ser: 0.76 mg/dL (ref 0.40–1.20)
GFR: 83.63 mL/min (ref >60.00)
Glucose, Bld: 96 mg/dL (ref 70–99)
Potassium: 4.1 mEq/L (ref 3.5–5.1)
Sodium: 140 mEq/L (ref 135–145)
Total Bilirubin: 0.4 mg/dL (ref 0.2–1.2)
Total Protein: 7.2 g/dL (ref 6.0–8.3)

## 2022-07-17 LAB — LIPID PANEL
Cholesterol: 205 mg/dL — ABNORMAL HIGH (ref 0–200)
HDL: 58.2 mg/dL (ref >39.00)
LDL Cholesterol: 126 mg/dL — ABNORMAL HIGH (ref 0–99)
NonHDL: 146.66
Total CHOL/HDL Ratio: 4
Triglycerides: 101 mg/dL (ref 0.0–149.0)
VLDL: 20.2 mg/dL (ref 0.0–40.0)

## 2022-07-17 LAB — HEMOGLOBIN A1C: Hgb A1c MFr Bld: 6.2 % (ref 4.6–6.5)

## 2022-07-17 LAB — MICROALBUMIN / CREATININE URINE RATIO
Creatinine,U: 39.1 mg/dL
Microalb Creat Ratio: 1.8 mg/g (ref 0.0–30.0)
Microalb, Ur: <0.7 mg/dL (ref 0.0–1.9)

## 2022-07-17 LAB — TSH: TSH: 1.45 u[IU]/mL (ref 0.35–5.50)

## 2022-07-17 MED ORDER — TIRZEPATIDE 2.5 MG/0.5ML ~~LOC~~ SOAJ: 2.5000 mg | SUBCUTANEOUS | 0 refills | Status: DC

## 2022-07-17 MED ORDER — TIRZEPATIDE 2.5 MG/0.5ML ~~LOC~~ SOAJ
2.5000 mg | SUBCUTANEOUS | 0 refills | Status: DC
  Filled 2022-07-17: qty 2, 28d supply, fill #0

## 2022-07-17 NOTE — Assessment & Plan Note (Addendum)
Ghm utd Check labs  See AVS   Health Maintenance  Topic Date Due   FOOT EXAM  12/21/2019   Diabetic kidney evaluation - Urine ACR  07/02/2021   HEMOGLOBIN A1C  07/08/2021   COVID-19 Vaccine (6 - 2023-24 season) 03/16/2022   MAMMOGRAM  11/14/2022   Diabetic kidney evaluation - eGFR measurement  04/19/2023   OPHTHALMOLOGY EXAM  06/26/2023   COLONOSCOPY (Pts 45-63yrs Insurance coverage will need to be confirmed)  03/02/2025   DTaP/Tdap/Td (3 - Td or Tdap) 12/20/2028   INFLUENZA VACCINE  Completed   Hepatitis C Screening  Completed   HIV Screening  Completed   Zoster Vaccines- Shingrix  Completed   HPV VACCINES  Aged Out

## 2022-07-17 NOTE — Patient Instructions (Signed)
Preventive Care 40-64 Years Old, Female Preventive care refers to lifestyle choices and visits with your health care provider that can promote health and wellness. Preventive care visits are also called wellness exams. What can I expect for my preventive care visit? Counseling Your health care provider may ask you questions about your: Medical history, including: Past medical problems. Family medical history. Pregnancy history. Current health, including: Menstrual cycle. Method of birth control. Emotional well-being. Home life and relationship well-being. Sexual activity and sexual health. Lifestyle, including: Alcohol, nicotine or tobacco, and drug use. Access to firearms. Diet, exercise, and sleep habits. Work and work environment. Sunscreen use. Safety issues such as seatbelt and bike helmet use. Physical exam Your health care provider will check your: Height and weight. These may be used to calculate your BMI (body mass index). BMI is a measurement that tells if you are at a healthy weight. Waist circumference. This measures the distance around your waistline. This measurement also tells if you are at a healthy weight and may help predict your risk of certain diseases, such as type 2 diabetes and high blood pressure. Heart rate and blood pressure. Body temperature. Skin for abnormal spots. What immunizations do I need?  Vaccines are usually given at various ages, according to a schedule. Your health care provider will recommend vaccines for you based on your age, medical history, and lifestyle or other factors, such as travel or where you work. What tests do I need? Screening Your health care provider may recommend screening tests for certain conditions. This may include: Lipid and cholesterol levels. Diabetes screening. This is done by checking your blood sugar (glucose) after you have not eaten for a while (fasting). Pelvic exam and Pap test. Hepatitis B test. Hepatitis C  test. HIV (human immunodeficiency virus) test. STI (sexually transmitted infection) testing, if you are at risk. Lung cancer screening. Colorectal cancer screening. Mammogram. Talk with your health care provider about when you should start having regular mammograms. This may depend on whether you have a family history of breast cancer. BRCA-related cancer screening. This may be done if you have a family history of breast, ovarian, tubal, or peritoneal cancers. Bone density scan. This is done to screen for osteoporosis. Talk with your health care provider about your test results, treatment options, and if necessary, the need for more tests. Follow these instructions at home: Eating and drinking  Eat a diet that includes fresh fruits and vegetables, whole grains, lean protein, and low-fat dairy products. Take vitamin and mineral supplements as recommended by your health care provider. Do not drink alcohol if: Your health care provider tells you not to drink. You are pregnant, may be pregnant, or are planning to become pregnant. If you drink alcohol: Limit how much you have to 0-1 drink a day. Know how much alcohol is in your drink. In the [place] one drink equals one 12 oz bottle of beer (355 mL), one 5 oz glass of wine (148 mL), or one 1 oz glass of hard liquor (44 mL). Lifestyle Brush your teeth every morning and night with fluoride toothpaste. Floss one time each day. Exercise for at least 30 minutes 5 or more days each week. Do not use any products that contain nicotine or tobacco. These products include cigarettes, chewing tobacco, and vaping devices, such as e-cigarettes. If you need help quitting, ask your health care provider. Do not use drugs. If you are sexually active, practice safe sex. Use a condom or other form of protection to   prevent STIs. If you do not wish to become pregnant, use a form of birth control. If you plan to become pregnant, see your health care provider for a  prepregnancy visit. Take aspirin only as told by your health care provider. Make sure that you understand how much to take and what form to take. Work with your health care provider to find out whether it is safe and beneficial for you to take aspirin daily. Find healthy ways to manage stress, such as: Meditation, yoga, or listening to music. Journaling. Talking to a trusted person. Spending time with friends and family. Minimize exposure to UV radiation to reduce your risk of skin cancer. Safety Always wear your seat belt while driving or riding in a vehicle. Do not drive: If you have been drinking alcohol. Do not ride with someone who has been drinking. When you are tired or distracted. While texting. If you have been using any mind-altering substances or drugs. Wear a helmet and other protective equipment during sports activities. If you have firearms in your house, make sure you follow all gun safety procedures. Seek help if you have been physically or sexually abused. What's next? Visit your health care provider once a year for an annual wellness visit. Ask your health care provider how often you should have your eyes and teeth checked. Stay up to date on all vaccines. This information is not intended to replace advice given to you by your health care provider. Make sure you discuss any questions you have with your health care provider. Document Revised: 10/03/2020 Document Reviewed: 10/03/2020 Elsevier Patient Education  2023 Else[employer] Inc.  

## 2022-07-17 NOTE — Assessment & Plan Note (Signed)
Well controlled, no changes to meds. Encouraged heart healthy diet such as the DASH diet and exercise as tolerated.  °

## 2022-07-17 NOTE — Progress Notes (Signed)
Subjective:   By signing my name below, I, Shehryar Baig, attest that this documentation has been prepared under the direction and in the presence of Ann Held, DO. 07/17/2022   Patient ID: Heather [name], female    DOB: 1960-04-24, 62 y.o.   MRN: [id number]  Chief Complaint  Patient presents with   Annual Exam    Pt states fasting     HPI Patient is in today for a comprehensive physical exam.   She did not receive her last prescription for zepbound and is interested ordering a new prescriptions and starting it.  She denies fever, new moles, congestion, sinus pain, sore throat, chest pain, palpitations, cough, shortness of breath, wheezing, nausea, vomiting, abdominal pain, diarrhea, constipation, dysuria, frequency, hematuria, new muscle pain, new joint pain, or headaches at this time.  She has no new surgical procedures to report. She has no changes to family medical history to report.  She is participating in regular exercise every night by doing back work outs for at least 5 minutes.  She has the latest Covid-19 booster vaccine. She has the latest flu vaccine.  Mammogram was last completed 11/13/2021. Results are normal. Repeat in 1 year.  Colonoscopy was last completed 03/02/2020. Results showed: - Two 6 to 7 mm polyps in the sigmoid colon, removed with a cold snare. Resected and retrieved. - The examination was otherwise normal on direct and retroflexion views. Repeat in 5 years.  She is UTD on vision care.   Past Medical History:  Diagnosis Date   Acute bronchitis    Allergic rhinitis    Allergy    Anemia    Anxiety    Arthritis    knees   Back pain    Glaucoma    Hyperlipidemia    Hypertension    Joint pain    Leg edema    Neuromuscular disorder ([place])    nerve issues in legs    Prediabetes     Past Surgical History:  Procedure Laterality Date   ABDOMINAL HYSTERECTOMY  1998   BREAST LUMPECTOMY     CESAREAN SECTION     COLONOSCOPY      MYOMECTOMY     POLYPECTOMY      Family History  Problem Relation Age of Onset   Dementia Father    Stroke Father    Atrial fibrillation Father    Alzheimer's disease Father    Hyperlipidemia Father    Hypertension Father    Heart disease Father    Prostate cancer Father 27   Hyperlipidemia Brother    Hypertension Brother    Colon polyps Brother    Hyperlipidemia Brother    Hypertension Brother    Hypertension Brother    Pulmonary embolism Brother    Cancer - Colon Brother 37       colon CA--stage 4   Colon cancer Brother    Colon cancer Cousin 46   Hypertension Mother    Hyperlipidemia Mother    Heart disease Mother    Endometrial cancer Other    Atrial fibrillation Other    Diabetes Maternal Aunt    Diabetes Maternal Uncle    Kidney disease Maternal Uncle        dialysis   Uterine cancer Maternal Grandmother    Cancer Maternal Grandfather        pancreatic   Pancreatic cancer Maternal Grandfather    Esophageal cancer Neg Hx    Rectal cancer Neg Hx    Stomach  cancer Neg Hx     Social History   Socioeconomic History   Marital status: Single    Spouse name: Not on file   Number of children: Not on file   Years of education: 20   Highest education level: Not on file  Occupational History   Occupation: Counsellor lab  Tobacco Use   Smoking status: Never   Smokeless tobacco: Never  Vaping Use   Vaping Use: Never used  Substance and Sexual Activity   Alcohol use: Yes    Alcohol/week: 0.0 standard drinks of alcohol    Comment: occ   Drug use: No   Sexual activity: Yes    Partners: Male  Other Topics Concern   Not on file  Social History Narrative   Exercise-- every night 5 min fat burning exercise    Social Determinants of Health   Financial Resource Strain: Not on file  Food Insecurity: Not on file  Transportation Needs: Not on file  Physical Activity: Not on file  Stress: Not on file  Social Connections: Not on file   Intimate Partner Violence: Not on file    Outpatient Medications Prior to Visit  Medication Sig Dispense Refill   cholecalciferol (VITAMIN D3) 25 MCG (1000 UNIT) tablet Take 1,000 Units by mouth daily.     cyclobenzaprine (FLEXERIL) 10 MG tablet Take 1 tablet by mouth three times daily as needed for muscle spasm 30 tablet 0   diclofenac sodium (VOLTAREN) 1 % GEL APPLY 4 GRAMS  TOPICALLY 4 TIMES DAILY 100 g 0   fluticasone (FLONASE) 50 MCG/ACT nasal spray Place 2 sprays into both nostrils daily. 48 g 1   folic acid (FOLVITE) 1 MG tablet Take 1 tablet (1 mg total) by mouth daily. 90 tablet 3   furosemide (LASIX) 20 MG tablet Take 1 tablet by mouth once daily 90 tablet 0   Lactobacillus (PROBIOTIC ACIDOPHILUS PO) Take by mouth.     latanoprost (XALATAN) 0.005 % ophthalmic solution      levocetirizine (XYZAL) 5 MG tablet Take 1 tablet (5 mg total) by mouth every evening. 90 tablet 1   lisinopril (ZESTRIL) 10 MG tablet Take 1 tablet by mouth once daily 90 tablet 0   Multiple Vitamin (MULTIVITAMIN) tablet Take 1 tablet by mouth daily.     Naproxen Sod-diphenhydrAMINE (ALEVE PM PO) Take 1 tablet by mouth at bedtime as needed.     pravastatin (PRAVACHOL) 40 MG tablet TAKE 1 TABLET BY MOUTH ONCE DAILY . APPOINTMENT REQUIRED FOR FUTURE REFILLS 30 tablet 2   TURMERIC PO Take by mouth.     zinc gluconate 50 MG tablet Take 50 mg by mouth daily.     tirzepatide (ZEPBOUND) 2.5 MG/0.5ML Pen Inject 2.5 mg into the skin once a week. (Patient not taking: Reported on 07/17/2022) 2 mL 1   amoxicillin-clavulanate (AUGMENTIN) 875-125 MG tablet Take 1 tablet by mouth 2 (two) times daily. (Patient not taking: Reported on 07/17/2022) 20 tablet 0   meloxicam (MOBIC) 15 MG tablet 1/2-1 po qd prn pain (Patient not taking: Reported on 07/17/2022) 30 tablet 0   ofloxacin (FLOXIN OTIC) 0.3 % OTIC solution Place 10 drops into the right ear daily. (Patient not taking: Reported on 07/17/2022) 10 mL 0   No facility-administered  medications prior to visit.    Allergies  Allergen Reactions   Oseltamivir Phosphate Other (See Comments)    Couldn't walk. Blood shot eyes    Review of Systems  Constitutional:  Negative for fever.  HENT:  Negative for congestion, sinus pain and sore throat.   Respiratory:  Negative for cough, shortness of breath and wheezing.   Cardiovascular:  Negative for chest pain and palpitations.  Gastrointestinal:  Negative for abdominal pain, constipation, diarrhea, nausea and vomiting.  Genitourinary:  Negative for dysuria, frequency and hematuria.  Musculoskeletal:        (-)new muscle pain (-)new joint pain  Skin:        (-)New moles  Neurological:  Negative for headaches.       Objective:    Physical Exam Constitutional:      General: She is not in acute distress.    Appearance: Normal appearance. She is not ill-appearing.  HENT:     Head: Normocephalic and atraumatic.     Right Ear: Tympanic membrane, ear canal and external ear normal.     Left Ear: Tympanic membrane, ear canal and external ear normal.  Eyes:     Extraocular Movements: Extraocular movements intact.     Pupils: Pupils are equal, round, and reactive to light.  Cardiovascular:     Rate and Rhythm: Normal rate and regular rhythm.     Heart sounds: Normal heart sounds. No murmur heard.    No gallop.  Pulmonary:     Effort: Pulmonary effort is normal. No respiratory distress.     Breath sounds: Normal breath sounds. No wheezing or rales.  Abdominal:     General: Bowel sounds are normal. There is no distension.     Palpations: Abdomen is soft.     Tenderness: There is no abdominal tenderness. There is no guarding.  Skin:    General: Skin is warm and dry.  Neurological:     Mental Status: She is alert and oriented to person, place, and time.  Psychiatric:        Judgment: Judgment normal.     BP 118/80 (BP Location: Left Arm, Patient Position: Sitting, Cuff Size: Large)   Pulse 71   Temp 98.7 F  (37.1 C) (Oral)   Resp 18   Ht 5\' 7"  (1.702 m)   Wt 269 lb 9.6 oz (122.3 kg)   SpO2 97%   BMI 42.23 kg/m  Wt Readings from Last 3 Encounters:  07/17/22 269 lb 9.6 oz (122.3 kg)  04/18/22 272 lb 12.8 oz (123.7 kg)  07/08/21 281 lb 12.8 oz (127.8 kg)       Assessment & Plan:  Preventative health care Assessment & Plan: Ghm utd Check labs  See AVS   Health Maintenance  Topic Date Due   FOOT EXAM  12/21/2019   Diabetic kidney evaluation - Urine ACR  07/02/2021   HEMOGLOBIN A1C  07/08/2021   COVID-19 Vaccine (6 - 2023-24 season) 03/16/2022   MAMMOGRAM  11/14/2022   Diabetic kidney evaluation - eGFR measurement  04/19/2023   OPHTHALMOLOGY EXAM  06/26/2023   COLONOSCOPY (Pts 45-76yrs Insurance coverage will need to be confirmed)  03/02/2025   DTaP/Tdap/Td (3 - Td or Tdap) 12/20/2028   INFLUENZA VACCINE  Completed   Hepatitis C Screening  Completed   HIV Screening  Completed   Zoster Vaccines- Shingrix  Completed   HPV VACCINES  Aged Out     Orders: -     Lipid panel -     CBC with Differential/Platelet -     Comprehensive metabolic panel -     Hemoglobin A1c -     Microalbumin / creatinine urine ratio -     TSH  Hyperlipidemia, unspecified  hyperlipidemia type -     Lipid panel -     Comprehensive metabolic panel  Primary hypertension Assessment & Plan: Well controlled, no changes to meds. Encouraged heart healthy diet such as the DASH diet and exercise as tolerated.    Orders: -     Lipid panel -     CBC with Differential/Platelet  Diet-controlled diabetes mellitus ([place]) -     Comprehensive metabolic panel -     Hemoglobin A1c -     Microalbumin / creatinine urine ratio -     Tirzepatide; Inject 2.5 mg into the skin once a week.  Dispense: 2 mL; Refill: 0  Estrogen deficiency -     DG Bone Density; Future    I, Ann Held, DO, personally preformed the services described in this documentation.  All medical record entries made by the scribe  were at my direction and in my presence.  I have reviewed the chart and discharge instructions (if applicable) and agree that the record reflects my personal performance and is accurate and complete. 07/17/2022   I,Shehryar Baig,acting as a scribe for Ann Held, DO.,have documented all relevant documentation on the behalf of Ann Held, DO,as directed by  Ann Held, DO while in the presence of Ann Held, DO.   Ann Held, DO

## 2022-07-23 ENCOUNTER — Other Ambulatory Visit: Payer: Self-pay

## 2022-07-23 MED ORDER — ROSUVASTATIN CALCIUM 10 MG PO TABS: 10.0000 mg | ORAL_TABLET | Freq: Every day | ORAL | 2 refills | Status: DC

## 2022-08-02 ENCOUNTER — Encounter: Payer: Self-pay | Admitting: Family Medicine

## 2022-08-04 ENCOUNTER — Other Ambulatory Visit: Payer: Self-pay | Admitting: Family Medicine

## 2022-08-04 MED ORDER — ZEPBOUND 5 MG/0.5ML ~~LOC~~ SOAJ: 5.0000 mg | SUBCUTANEOUS | 3 refills | Status: DC

## 2022-08-09 ENCOUNTER — Other Ambulatory Visit: Payer: Self-pay | Admitting: Family Medicine

## 2022-08-09 DIAGNOSIS — E119 Type 2 diabetes mellitus without complications: Secondary | ICD-10-CM

## 2022-08-12 ENCOUNTER — Other Ambulatory Visit (HOSPITAL_BASED_OUTPATIENT_CLINIC_OR_DEPARTMENT_OTHER): Payer: Self-pay

## 2022-08-12 MED ORDER — TIRZEPATIDE 5 MG/0.5ML ~~LOC~~ SOAJ
5.0000 mg | SUBCUTANEOUS | 0 refills | Status: DC
  Filled 2022-08-12: qty 2, 28d supply, fill #0

## 2022-08-19 ENCOUNTER — Other Ambulatory Visit: Payer: Self-pay | Admitting: Family Medicine

## 2022-08-19 DIAGNOSIS — E785 Hyperlipidemia, unspecified: Secondary | ICD-10-CM

## 2022-09-03 ENCOUNTER — Other Ambulatory Visit: Payer: Self-pay | Admitting: Family Medicine

## 2022-09-03 ENCOUNTER — Other Ambulatory Visit (HOSPITAL_BASED_OUTPATIENT_CLINIC_OR_DEPARTMENT_OTHER): Payer: Self-pay

## 2022-09-10 ENCOUNTER — Other Ambulatory Visit: Payer: Self-pay | Admitting: Family Medicine

## 2022-09-10 ENCOUNTER — Other Ambulatory Visit (HOSPITAL_BASED_OUTPATIENT_CLINIC_OR_DEPARTMENT_OTHER): Payer: Self-pay

## 2022-09-16 ENCOUNTER — Other Ambulatory Visit (HOSPITAL_BASED_OUTPATIENT_CLINIC_OR_DEPARTMENT_OTHER): Payer: Self-pay

## 2022-09-16 ENCOUNTER — Ambulatory Visit: Payer: 59 | Admitting: Family Medicine

## 2022-09-16 ENCOUNTER — Other Ambulatory Visit: Payer: Self-pay | Admitting: Family Medicine

## 2022-09-16 ENCOUNTER — Telehealth: Payer: Self-pay | Admitting: Family Medicine

## 2022-09-16 ENCOUNTER — Encounter: Payer: Self-pay | Admitting: Family Medicine

## 2022-09-16 VITALS — BP 108/80 | HR 68 | Temp 98.3°F | Resp 18 | Ht 67.0 in | Wt 262.0 lb

## 2022-09-16 DIAGNOSIS — I10 Essential (primary) hypertension: Secondary | ICD-10-CM

## 2022-09-16 DIAGNOSIS — F419 Anxiety disorder, unspecified: Secondary | ICD-10-CM | POA: Diagnosis not present

## 2022-09-16 DIAGNOSIS — R079 Chest pain, unspecified: Secondary | ICD-10-CM | POA: Diagnosis not present

## 2022-09-16 DIAGNOSIS — E785 Hyperlipidemia, unspecified: Secondary | ICD-10-CM

## 2022-09-16 DIAGNOSIS — Z7985 Long-term (current) use of injectable non-insulin antidiabetic drugs: Secondary | ICD-10-CM

## 2022-09-16 DIAGNOSIS — R7989 Other specified abnormal findings of blood chemistry: Secondary | ICD-10-CM

## 2022-09-16 DIAGNOSIS — Z6841 Body Mass Index (BMI) 40.0 and over, adult: Secondary | ICD-10-CM

## 2022-09-16 DIAGNOSIS — E1169 Type 2 diabetes mellitus with other specified complication: Secondary | ICD-10-CM

## 2022-09-16 LAB — COMPREHENSIVE METABOLIC PANEL
ALT: 12 U/L (ref 0–35)
AST: 14 U/L (ref 0–37)
Albumin: 3.7 g/dL (ref 3.5–5.2)
Alkaline Phosphatase: 52 U/L (ref 39–117)
BUN: 14 mg/dL (ref 6–23)
CO2: 30 mEq/L (ref 19–32)
Calcium: 9.2 mg/dL (ref 8.4–10.5)
Chloride: 103 mEq/L (ref 96–112)
Creatinine, Ser: 0.77 mg/dL (ref 0.40–1.20)
GFR: 82.23 mL/min (ref >60.00)
Glucose, Bld: 87 mg/dL (ref 70–99)
Potassium: 4.3 mEq/L (ref 3.5–5.1)
Sodium: 140 mEq/L (ref 135–145)
Total Bilirubin: 0.3 mg/dL (ref 0.2–1.2)
Total Protein: 6.6 g/dL (ref 6.0–8.3)

## 2022-09-16 LAB — LIPID PANEL
Cholesterol: 130 mg/dL (ref 0–200)
HDL: 47.4 mg/dL (ref >39.00)
LDL Cholesterol: 69 mg/dL (ref 0–99)
NonHDL: 82.91
Total CHOL/HDL Ratio: 3
Triglycerides: 70 mg/dL (ref 0.0–149.0)
VLDL: 14 mg/dL (ref 0.0–40.0)

## 2022-09-16 LAB — CBC WITH DIFFERENTIAL/PLATELET
Basophils Absolute: 0 10*3/uL (ref 0.0–0.1)
Basophils Relative: 0.8 % (ref 0.0–3.0)
Eosinophils Absolute: 0.1 10*3/uL (ref 0.0–0.7)
Eosinophils Relative: 1.7 % (ref 0.0–5.0)
HCT: 38.7 % (ref 36.0–46.0)
Hemoglobin: 12.3 g/dL (ref 12.0–15.0)
Lymphocytes Relative: 40.1 % (ref 12.0–46.0)
Lymphs Abs: 2 10*3/uL (ref 0.7–4.0)
MCHC: 31.8 g/dL (ref 30.0–36.0)
MCV: 76.7 fl — ABNORMAL LOW (ref 78.0–100.0)
Monocytes Absolute: 0.4 10*3/uL (ref 0.1–1.0)
Monocytes Relative: 8.7 % (ref 3.0–12.0)
Neutro Abs: 2.5 10*3/uL (ref 1.4–7.7)
Neutrophils Relative %: 48.7 % (ref 43.0–77.0)
Platelets: 152 10*3/uL (ref 150.0–400.0)
RBC: 5.04 Mil/uL (ref 3.87–5.11)
RDW: 15.1 % (ref 11.5–15.5)
WBC: 5.1 10*3/uL (ref 4.0–10.5)

## 2022-09-16 LAB — D-DIMER, QUANTITATIVE: D-Dimer, Quant: 1.14 mcg/mL FEU — ABNORMAL HIGH (ref <0.50)

## 2022-09-16 LAB — TROPONIN I (HIGH SENSITIVITY): High Sens Troponin I: 2 ng/L (ref 2–17)

## 2022-09-16 LAB — TSH: TSH: 1.52 u[IU]/mL (ref 0.35–5.50)

## 2022-09-16 MED ORDER — ZEPBOUND 5 MG/0.5ML ~~LOC~~ SOAJ
5.0000 mg | SUBCUTANEOUS | 3 refills | Status: DC
Start: 2022-09-16 — End: 2022-09-17
  Filled 2022-09-16: qty 0.5, 7d supply, fill #0
  Filled 2022-09-16: qty 2, 28d supply, fill #0

## 2022-09-16 MED ORDER — ALPRAZOLAM 0.25 MG PO TABS
0.2500 mg | ORAL_TABLET | Freq: Two times a day (BID) | ORAL | 0 refills | Status: AC | PRN
Start: 2022-09-16 — End: ?
  Filled 2022-09-16: qty 20, 10d supply, fill #0

## 2022-09-16 NOTE — Assessment & Plan Note (Signed)
Xanax 0.25 mg 1 po bid as needed anxiety

## 2022-09-16 NOTE — Patient Instructions (Signed)
Chest Wall Pain Chest wall pain is pain in or around the bones and muscles of your chest. Sometimes, an injury causes this pain. Excessive coughing or overuse of arm and chest muscles may also cause chest wall pain. Sometimes, the cause may not be known. This pain may take several weeks or longer to get better. Follow these instructions at home: Managing pain, stiffness, and swelling  If directed, put ice on the painful area: Put ice in a plastic bag. Place a towel between your skin and the bag. Leave the ice on for 20 minutes, 2-3 times per day. Activity Rest as told by your health care provider. Avoid activities that cause pain. These include any activities that use your chest muscles or your abdominal and side muscles to lift heavy items. Ask your health care provider what activities are safe for you. General instructions  Take over-the-counter and prescription medicines only as told by your health care provider. Do not use any products that contain nicotine or tobacco, such as cigarettes, e-cigarettes, and chewing tobacco. These can delay healing after injury. If you need help quitting, ask your health care provider. Keep all follow-up visits as told by your health care provider. This is important. Contact a health care provider if: You have a fever. Your chest pain becomes worse. You have new symptoms. Get help right away if: You have nausea or vomiting. You feel sweaty or light-headed. You have a cough with mucus from your lungs (sputum) or you cough up blood. You develop shortness of breath. These symptoms may represent a serious problem that is an emergency. Do not wait to see if the symptoms will go away. Get medical help right away. Call your local emergency services (911 in the [place] Do not drive yourself to the hospital. Summary Chest wall pain is pain in or around the bones and muscles of your chest. Depending on the cause, it may be treated with ice, rest, medicines, and  avoiding activities that cause pain. Contact a health care provider if you have a fever, worsening chest pain, or new symptoms. Get help right away if you feel light-headed or you develop shortness of breath. These symptoms may be an emergency. This information is not intended to replace advice given to you by your health care provider. Make sure you discuss any questions you have with your health care provider. Document Revised: 03/31/2022 Document Reviewed: 03/31/2022 Elsevier Patient Education  2024 [employer]  

## 2022-09-16 NOTE — Assessment & Plan Note (Signed)
Tolerating statin, encouraged heart healthy diet, avoid trans fats, minimize simple carbs and saturated fats. Increase exercise as tolerated 

## 2022-09-16 NOTE — Progress Notes (Signed)
Subjective:   By signing my name below, I, Shehryar Baig, attest that this documentation has been prepared under the direction and in the presence of Donato Schultz, DO. 09/16/2022   Patient ID: Heather [name], female    DOB: [birth year], 62 y.o.   MRN: [id number]  Chief Complaint  Patient presents with   Stress    Pt states having lightheaded, pain under left arm pain, tingling down the left arm, close to 2 weeks.    HPI Patient is in today for a office visit.   She complains of lightheadedness and constant pain under her left arm for the past 2 weeks. She denies shortness of breathe and palpitations. She is taking aspirin to manage her symptoms. Her blood pressure is measuring normal during this visit. She reports 2 weeks ago she was at [place] nieces [place] when her mother had pain and had to go the emergency room. She needed a procedure but they decided to have the procedure in [place]. There was difficulty getting home due to delays in flight. Her mother is in in the ICU and is being taken care of at this time. These events caused her great stress which she thinks contributed to her symptoms.   BP Readings from Last 3 Encounters:  09/16/22 108/80  07/17/22 118/80  04/18/22 120/88   Pulse Readings from Last 3 Encounters:  09/16/22 68  07/17/22 71  04/18/22 83     Past Medical History:  Diagnosis Date   Acute bronchitis    Allergic rhinitis    Allergy    Anemia    Anxiety    Arthritis    knees   Back pain    Glaucoma    Hyperlipidemia    Hypertension    Joint pain    Leg edema    Neuromuscular disorder (HCC)    nerve issues in legs    Prediabetes     Past Surgical History:  Procedure Laterality Date   ABDOMINAL HYSTERECTOMY  1998   BREAST LUMPECTOMY     CESAREAN SECTION     COLONOSCOPY     MYOMECTOMY     POLYPECTOMY      Family History  Problem Relation Age of Onset   Dementia Father    Stroke Father    Atrial fibrillation  Father    Alzheimer's disease Father    Hyperlipidemia Father    Hypertension Father    Heart disease Father    Prostate cancer Father 4   Hyperlipidemia Brother    Hypertension Brother    Colon polyps Brother    Hyperlipidemia Brother    Hypertension Brother    Hypertension Brother    Pulmonary embolism Brother    Cancer - Colon Brother 56       colon CA--stage 4   Colon cancer Brother    Colon cancer Cousin 48   Hypertension Mother    Hyperlipidemia Mother    Heart disease Mother    Endometrial cancer Other    Atrial fibrillation Other    Diabetes Maternal Aunt    Diabetes Maternal Uncle    Kidney disease Maternal Uncle        dialysis   Uterine cancer Maternal Grandmother    Cancer Maternal Grandfather        pancreatic   Pancreatic cancer Maternal Grandfather    Esophageal cancer Neg Hx    Rectal cancer Neg Hx    Stomach cancer Neg Hx  Social History   Socioeconomic History   Marital status: Single    Spouse name: Not on file   Number of children: Not on file   Years of education: 20   Highest education level: Not on file  Occupational History   Occupation: IT consultant lab  Tobacco Use   Smoking status: Never   Smokeless tobacco: Never  Vaping Use   Vaping Use: Never used  Substance and Sexual Activity   Alcohol use: Yes    Alcohol/week: 0.0 standard drinks of alcohol    Comment: occ   Drug use: No   Sexual activity: Yes    Partners: Male  Other Topics Concern   Not on file  Social History Narrative   Exercise-- every night 5 min fat burning exercise    Social Determinants of Health   Financial Resource Strain: Not on file  Food Insecurity: Not on file  Transportation Needs: Not on file  Physical Activity: Not on file  Stress: Not on file  Social Connections: Not on file  Intimate Partner Violence: Not on file    Outpatient Medications Prior to Visit  Medication Sig Dispense Refill   cholecalciferol (VITAMIN D3)  25 MCG (1000 UNIT) tablet Take 1,000 Units by mouth daily.     cyclobenzaprine (FLEXERIL) 10 MG tablet Take 1 tablet by mouth three times daily as needed for muscle spasm 30 tablet 0   diclofenac sodium (VOLTAREN) 1 % GEL APPLY 4 GRAMS  TOPICALLY 4 TIMES DAILY 100 g 0   fluticasone (FLONASE) 50 MCG/ACT nasal spray Place 2 sprays into both nostrils daily. 48 g 1   folic acid (FOLVITE) 1 MG tablet Take 1 tablet (1 mg total) by mouth daily. 90 tablet 3   furosemide (LASIX) 20 MG tablet Take 1 tablet by mouth once daily 90 tablet 0   Lactobacillus (PROBIOTIC ACIDOPHILUS PO) Take by mouth.     latanoprost (XALATAN) 0.005 % ophthalmic solution      levocetirizine (XYZAL) 5 MG tablet Take 1 tablet (5 mg total) by mouth every evening. 90 tablet 1   lisinopril (ZESTRIL) 10 MG tablet Take 1 tablet by mouth once daily 90 tablet 0   Multiple Vitamin (MULTIVITAMIN) tablet Take 1 tablet by mouth daily.     Naproxen Sod-diphenhydrAMINE (ALEVE PM PO) Take 1 tablet by mouth at bedtime as needed.     rosuvastatin (CRESTOR) 10 MG tablet Take 1 tablet (10 mg total) by mouth daily. 30 tablet 2   tirzepatide (MOUNJARO) 5 MG/0.5ML Pen Inject 5 mg into the skin once a week. 2 mL 0   TURMERIC PO Take by mouth.     zinc gluconate 50 MG tablet Take 50 mg by mouth daily.     tirzepatide (ZEPBOUND) 5 MG/0.5ML Pen Inject 5 mg into the skin once a week. 0.5 mL 3   No facility-administered medications prior to visit.    Allergies  Allergen Reactions   Oseltamivir Phosphate Other (See Comments)    Couldn't walk. Blood shot eyes    Review of Systems  Musculoskeletal:        (+)left under arm pain  Neurological:  Positive for dizziness.   EKG--- nsr , no change since 06/2021     Objective:    Physical Exam Constitutional:      General: She is not in acute distress.    Appearance: Normal appearance. She is not ill-appearing.  HENT:     Head: Normocephalic and atraumatic.     Right Ear:  External ear normal.      Left Ear: External ear normal.  Eyes:     Extraocular Movements: Extraocular movements intact.     Pupils: Pupils are equal, round, and reactive to light.  Cardiovascular:     Rate and Rhythm: Normal rate and regular rhythm.     Heart sounds: Normal heart sounds. No murmur heard.    No gallop.  Pulmonary:     Effort: Pulmonary effort is normal. No respiratory distress.     Breath sounds: Normal breath sounds. No wheezing or rales.  Skin:    General: Skin is warm and dry.  Neurological:     Mental Status: She is alert and oriented to person, place, and time.  Psychiatric:        Judgment: Judgment normal.     BP 108/80 (BP Location: Right Arm, Patient Position: Sitting, Cuff Size: Large)   Pulse 68   Temp 98.3 F (36.8 C) (Oral)   Resp 18   Ht 5\' 7"  (1.702 m)   Wt 262 lb (118.8 kg)   SpO2 96%   BMI 41.04 kg/m  Wt Readings from Last 3 Encounters:  09/16/22 262 lb (118.8 kg)  07/17/22 269 lb 9.6 oz (122.3 kg)  04/18/22 272 lb 12.8 oz (123.7 kg)       Assessment & Plan:  Chest pain, unspecified type -     EKG 12-Lead -     CBC with Differential/Platelet -     Comprehensive metabolic panel -     Lipid panel -     TSH -     D-dimer, quantitative -     Troponin I (High Sensitivity) -     Ambulatory referral to Cardiology -     ALPRAZolam; Take 1 tablet (0.25 mg total) by mouth 2 (two) times daily as needed for anxiety.  Dispense: 20 tablet; Refill: 0  Morbid obesity (HCC) -     Zepbound; Inject 5 mg into the skin once a week.  Dispense: 0.5 mL; Refill: 3  Anxiety Assessment & Plan: Xanax 0.25 mg 1 po bid as needed anxiety    Hyperlipidemia associated with type 2 diabetes mellitus (HCC) Assessment & Plan: Tolerating statin, encouraged heart healthy diet, avoid trans fats, minimize simple carbs and saturated fats. Increase exercise as tolerated    Primary hypertension Assessment & Plan: Well controlled, no changes to meds. Encouraged heart healthy diet such as  the DASH diet and exercise as tolerated.       IDonato Schultz, DO, personally preformed the services described in this documentation.  All medical record entries made by the scribe were at my direction and in my presence.  I have reviewed the chart and discharge instructions (if applicable) and agree that the record reflects my personal performance and is accurate and complete. 09/16/2022   I,Shehryar Baig,acting as a scribe for Donato Schultz, DO.,have documented all relevant documentation on the behalf of Donato Schultz, DO,as directed by  Donato Schultz, DO while in the presence of Donato Schultz, DO.   Donato Schultz, DO

## 2022-09-16 NOTE — Telephone Encounter (Signed)
[facility] called stating that zepbound is not covered by the pt's ins and was wondering if the same amount could be sent for the [facility].

## 2022-09-16 NOTE — Assessment & Plan Note (Signed)
Well controlled, no changes to meds. Encouraged heart healthy diet such as the DASH diet and exercise as tolerated.  °

## 2022-09-17 ENCOUNTER — Other Ambulatory Visit: Payer: Self-pay | Admitting: Family Medicine

## 2022-09-17 ENCOUNTER — Encounter: Payer: Self-pay | Admitting: Family Medicine

## 2022-09-17 ENCOUNTER — Other Ambulatory Visit (HOSPITAL_BASED_OUTPATIENT_CLINIC_OR_DEPARTMENT_OTHER): Payer: Self-pay

## 2022-09-17 DIAGNOSIS — E119 Type 2 diabetes mellitus without complications: Secondary | ICD-10-CM

## 2022-09-17 MED ORDER — TIRZEPATIDE 5 MG/0.5ML ~~LOC~~ SOAJ
5.0000 mg | SUBCUTANEOUS | 0 refills | Status: DC
Start: 2022-09-17 — End: 2023-01-14
  Filled 2022-09-17 – 2022-10-10 (×2): qty 2, 28d supply, fill #0
  Filled 2022-11-02: qty 2, 28d supply, fill #1
  Filled 2022-12-10 – 2022-12-19 (×6): qty 2, 28d supply, fill #2

## 2022-09-18 ENCOUNTER — Ambulatory Visit (HOSPITAL_BASED_OUTPATIENT_CLINIC_OR_DEPARTMENT_OTHER)
Admission: RE | Admit: 2022-09-18 | Discharge: 2022-09-18 | Disposition: A | Discharge status: Home / Self Care | Payer: 59 | Source: Ambulatory Visit | Attending: Family Medicine | Admitting: Family Medicine

## 2022-09-18 DIAGNOSIS — R079 Chest pain, unspecified: Secondary | ICD-10-CM | POA: Diagnosis present

## 2022-09-18 DIAGNOSIS — R7989 Other specified abnormal findings of blood chemistry: Secondary | ICD-10-CM | POA: Insufficient documentation

## 2022-09-18 MED ORDER — IOHEXOL 350 MG/ML SOLN
100.0000 mL | Freq: Once | INTRAVENOUS | Status: AC | PRN
  Administered 2022-09-18: 100 mL via INTRAVENOUS

## 2022-10-09 ENCOUNTER — Other Ambulatory Visit: Payer: Self-pay | Admitting: Family Medicine

## 2022-10-09 DIAGNOSIS — R600 Localized edema: Secondary | ICD-10-CM

## 2022-10-10 ENCOUNTER — Other Ambulatory Visit (HOSPITAL_BASED_OUTPATIENT_CLINIC_OR_DEPARTMENT_OTHER): Payer: Self-pay

## 2022-11-02 ENCOUNTER — Other Ambulatory Visit: Payer: Self-pay | Admitting: Family Medicine

## 2022-11-02 DIAGNOSIS — R600 Localized edema: Secondary | ICD-10-CM

## 2022-11-02 DIAGNOSIS — I10 Essential (primary) hypertension: Secondary | ICD-10-CM

## 2022-11-04 MED ORDER — LISINOPRIL 10 MG PO TABS
10.0000 mg | ORAL_TABLET | Freq: Every day | ORAL | 0 refills | Status: DC
Start: 2022-11-04 — End: 2023-02-13

## 2022-11-06 ENCOUNTER — Encounter: Payer: Self-pay | Admitting: Internal Medicine

## 2022-11-06 ENCOUNTER — Ambulatory Visit: Payer: 59 | Attending: Internal Medicine | Admitting: Internal Medicine

## 2022-11-06 VITALS — BP 122/82 | HR 68 | Ht 65.0 in | Wt 255.4 lb

## 2022-11-06 DIAGNOSIS — I10 Essential (primary) hypertension: Secondary | ICD-10-CM

## 2022-11-06 DIAGNOSIS — R079 Chest pain, unspecified: Secondary | ICD-10-CM | POA: Diagnosis not present

## 2022-11-06 DIAGNOSIS — E785 Hyperlipidemia, unspecified: Secondary | ICD-10-CM | POA: Diagnosis not present

## 2022-11-06 DIAGNOSIS — Z01812 Encounter for preprocedural laboratory examination: Secondary | ICD-10-CM | POA: Diagnosis not present

## 2022-11-06 MED ORDER — METOPROLOL TARTRATE 50 MG PO TABS: 50.0000 mg | ORAL_TABLET | ORAL | 0 refills | Status: DC

## 2022-11-06 NOTE — Patient Instructions (Signed)
Medication Instructions:  Take a one-time dose of metoprolol tartrate two hours before your CT test  *If you need a refill on your cardiac medications before your next appointment, please call your pharmacy*   Lab Work: NON-FASTING BMET and LPa -- complete a few days before your CT  If you have labs (blood work) drawn today and your tests are completely normal, you will receive your results only by: MyChart Message (if you have MyChart) OR A paper copy in the mail If you have any lab test that is abnormal or we need to change your treatment, we will call you to review the results.   Testing/Procedures: Coronary CT at [facility] You will be called to schedule this once approved with insurance   Follow-Up: At [facility], you and your health needs are our priority.  As part of our continuing mission to provide you with exceptional heart care, we have created designated Provider Care Teams.  These Care Teams include your primary Cardiologist (physician) and Advanced Practice Providers (APPs -  Physician Assistants and Nurse Practitioners) who all work together to provide you with the care you need, when you need it.  We recommend signing up for the patient portal called "MyChart".  Sign up information is provided on this After Visit Summary.  MyChart is used to connect with patients for Virtual Visits (Telemedicine).  Patients are able to view lab/test results, encounter notes, upcoming appointments, etc.  Non-urgent messages can be sent to your provider as well.   To learn more about what you can do with MyChart, go to [email or web address].    Your next appointment:    6-8 weeks with Dr. Rennis Golden -- after CT  Other Instructions    Your cardiac CT will be scheduled at one of the below locations:   [facility] [address] [place], [place] [address] [phone]  OR  [facility] [address] [place], [place] [address] [phone]  OR   [facility] [address] [place], [place] [address] [phone]  If scheduled at [facility], please arrive at the [facility] Entrance (Entrance C2) of [facility] 30 minutes prior to test start time. You can use the FREE valet parking offered at entrance C (encouraged to control the heart rate for the test)  Proceed to the [facility] Radiology Department (first floor) to check-in and test prep.  All radiology patients and guests should use entrance C2 at [facility], accessed from [facility], even though the hospital's physical address listed is [address]    If scheduled at [facility] or [facility], please arrive 15 mins early for check-in and test prep.  There is spacious parking and easy access to the radiology department from the [facility] entrance. Please enter here and check-in with the desk attendant.   Please follow these instructions carefully (unless otherwise directed):  An IV will be required for this test and Nitroglycerin will be given.  Hold all erectile dysfunction medications at least 3 days (72 hrs) prior to test. (Ie viagra, cialis, sildenafil, tadalafil, etc)   On the Night Before the Test: Be sure to Drink plenty of water. Do not consume any caffeinated/decaffeinated beverages or chocolate 12 hours prior to your test. Do not take any antihistamines 12 hours prior to your test.  On the Day of the Test: Drink plenty  of water until 1 hour prior to the test. Do not eat any food 1 hour prior to test. You may take your regular medications prior to the test.  Take metoprolol (Lopressor) two hours prior to test. If you take Furosemide/Hydrochlorothiazide/Spironolactone, please HOLD on the morning of the test. FEMALES- please wear underwire-free bra if available, avoid  dresses & tight clothing      After the Test: Drink plenty of water. After receiving IV contrast, you may experience a mild flushed feeling. This is normal. On occasion, you may experience a mild rash up to 24 hours after the test. This is not dangerous. If this occurs, you can take Benadryl 25 mg and increase your fluid intake. If you experience trouble breathing, this can be serious. If it is severe call 911 IMMEDIATELY. If it is mild, please call our office. If you take any of these medications: Glipizide/Metformin, Avandament, Glucavance, please do not take 48 hours after completing test unless otherwise instructed.  We will call to schedule your test 2-4 weeks out understanding that some insurance companies will need an authorization prior to the service being performed.   For more information and frequently asked questions, please visit our website : [email or web address]  For non-scheduling related questions, please contact the cardiac imaging nurse navigator should you have any questions/concerns: Cardiac Imaging Nurse Navigators Direct Office Dial: [phone]   For scheduling needs, including cancellations and rescheduling, please call [place], [phone].

## 2022-11-06 NOTE — Progress Notes (Signed)
OFFICE CONSULT NOTE  Chief Complaint:  Chest pain  Primary Care Physician: Donato Schultz, DO  HPI:  Heather [name] is a 63 y.o. female who is being seen today for the evaluation of chest pain at the request of Donato Schultz, *.  This is a pleasant 62 year old female kindly referred for evaluation of chest pain.  She had an episode several months ago but reported she was under a lot of stress at the time.  She was taking care of her elderly mother who has unfortunately since passed.  She was seen by her PCP and noted to have an elevated D-dimer and subsequently underwent a CT angio of the chest at [facility] on Sep 18, 2022.  This demonstrated no pulmonary embolus however mild cardiomegaly and small bilateral pleural effusions.  No mention of coronary artery calcifications.  Since then she really has not had a lot of chest pain.  The pain was described as left-sided and radiating somewhat down her arm and to her back and up to her neck.  Not necessarily worse with exertion or relieved by rest.  She does have a lot of cardiovascular risk factors though including hypertension which is well-managed and dyslipidemia.  She has a strong family history of heart disease as well.  She also had a recent echo which showed normal LVEF of 60 to 65%, moderate left atrial enlargement and mild mitral regurgitation.  She was not aware of the mitral regurgitation and left atrial enlargement which we went over today.  She does have a history of A-fib in the family but does not endorse any palpitations.  EKG personally reviewed shows normal sinus rhythm.  PMHx:  Past Medical History:  Diagnosis Date   Acute bronchitis    Allergic rhinitis    Allergy    Anemia    Anxiety    Arthritis    knees   Back pain    Glaucoma    Hyperlipidemia    Hypertension    Joint pain    Leg edema    Neuromuscular disorder (HCC)    nerve issues in legs    Prediabetes     Past Surgical  History:  Procedure Laterality Date   ABDOMINAL HYSTERECTOMY  1998   BREAST LUMPECTOMY     CESAREAN SECTION     COLONOSCOPY     MYOMECTOMY     POLYPECTOMY      FAMHx:  Family History  Problem Relation Age of Onset   Dementia Father    Stroke Father    Atrial fibrillation Father    Alzheimer's disease Father    Hyperlipidemia Father    Hypertension Father    Heart disease Father    Prostate cancer Father 71   Hyperlipidemia Brother    Hypertension Brother    Colon polyps Brother    Hyperlipidemia Brother    Hypertension Brother    Hypertension Brother    Pulmonary embolism Brother    Cancer - Colon Brother 31       colon CA--stage 4   Colon cancer Brother    Colon cancer Cousin 42   Hypertension Mother    Hyperlipidemia Mother    Heart disease Mother    Endometrial cancer Other    Atrial fibrillation Other    Diabetes Maternal Aunt    Diabetes Maternal Uncle    Kidney disease Maternal Uncle        dialysis   Uterine cancer Maternal  Grandmother    Cancer Maternal Grandfather        pancreatic   Pancreatic cancer Maternal Grandfather    Esophageal cancer Neg Hx    Rectal cancer Neg Hx    Stomach cancer Neg Hx     SOCHx:   reports that she has never smoked. She has never used smokeless tobacco. She reports current alcohol use. She reports that she does not use drugs.  ALLERGIES:  Allergies  Allergen Reactions   Oseltamivir Phosphate Other (See Comments)    Couldn't walk. Blood shot eyes    ROS: Pertinent items noted in HPI and remainder of comprehensive ROS otherwise negative.  HOME MEDS: Current Outpatient Medications on File Prior to Visit  Medication Sig Dispense Refill   ALPRAZolam (XANAX) 0.25 MG tablet Take 1 tablet (0.25 mg total) by mouth 2 (two) times daily as needed for anxiety. 20 tablet 0   cholecalciferol (VITAMIN D3) 25 MCG (1000 UNIT) tablet Take 1,000 Units by mouth daily.     cyclobenzaprine (FLEXERIL) 10 MG tablet Take 1 tablet by  mouth three times daily as needed for muscle spasm 30 tablet 0   diclofenac sodium (VOLTAREN) 1 % GEL APPLY 4 GRAMS  TOPICALLY 4 TIMES DAILY 100 g 0   fluticasone (FLONASE) 50 MCG/ACT nasal spray Place 2 sprays into both nostrils daily. 48 g 1   folic acid (FOLVITE) 1 MG tablet Take 1 tablet (1 mg total) by mouth daily. 90 tablet 3   furosemide (LASIX) 20 MG tablet Take 1 tablet by mouth once daily 90 tablet 0   Lactobacillus (PROBIOTIC ACIDOPHILUS PO) Take by mouth.     latanoprost (XALATAN) 0.005 % ophthalmic solution      levocetirizine (XYZAL) 5 MG tablet Take 1 tablet (5 mg total) by mouth every evening. 90 tablet 1   lisinopril (ZESTRIL) 10 MG tablet Take 1 tablet (10 mg total) by mouth daily. 90 tablet 0   Multiple Vitamin (MULTIVITAMIN) tablet Take 1 tablet by mouth daily.     Naproxen Sod-diphenhydrAMINE (ALEVE PM PO) Take 1 tablet by mouth at bedtime as needed.     rosuvastatin (CRESTOR) 10 MG tablet Take 1 tablet (10 mg total) by mouth daily. 30 tablet 2   tirzepatide (MOUNJARO) 5 MG/0.5ML Pen Inject 5 mg into the skin once a week. 6 mL 0   TURMERIC PO Take by mouth.     zinc gluconate 50 MG tablet Take 50 mg by mouth daily.     No current facility-administered medications on file prior to visit.    LABS/IMAGING: No results found for this or any previous visit (from the past 48 hour(s)). No results found.  LIPID PANEL:    Component Value Date/Time   CHOL 130 09/16/2022 1120   CHOL 215 (H) 09/24/2017 0921   TRIG 70.0 09/16/2022 1120   HDL 47.40 09/16/2022 1120   HDL 72 09/24/2017 0921   CHOLHDL 3 09/16/2022 1120   VLDL 14.0 09/16/2022 1120   LDLCALC 69 09/16/2022 1120   LDLCALC 118 (H) 04/18/2022 1619   LDLDIRECT 128.0 01/03/2013 0829    WEIGHTS: Wt Readings from Last 3 Encounters:  11/06/22 255 lb 6.4 oz (115.8 kg)  09/16/22 262 lb (118.8 kg)  07/17/22 269 lb 9.6 oz (122.3 kg)    VITALS: BP 122/82   Pulse 68   Ht 5\' 5"  (1.651 m)   Wt 255 lb 6.4 oz (115.8  kg)   SpO2 98%   BMI 42.50 kg/m   EXAM: General  appearance: alert, no distress, and morbidly obese Neck: no carotid bruit, no JVD, and thyroid not enlarged, symmetric, no tenderness/mass/nodules Lungs: clear to auscultation bilaterally Heart: regular rate and rhythm Abdomen: soft, non-tender; bowel sounds normal; no masses,  no organomegaly Extremities: extremities normal, atraumatic, no cyanosis or edema Pulses: 2+ and symmetric Skin: Skin color, texture, turgor normal. 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   Ms. Brissette is describing chest pain but it has subsequently resolved.  She underwent evaluation including echocardiogram which showed moderate left atrial enlargement and mild MR with normal LVEF.  She has hypertension which is controlled.  She was also noted to have some pleural effusions on CT scan which she was sent for her to rule out PE in the setting of an abnormal D-dimer.  There is no evidence of PE or dissection.  No mention of coronary artery calcification.  I do think it would be reasonable to further evaluate for coronary disease although my suspicion for cardiac chest pain is low, she does have cardiac risk factors and a strong family history of heart disease.  Will recommend CT coronary angiography.  She will likely need 50 mg metoprolol prior to the procedure.  I will contact her with those results and recommend further treatment options as necessary.  Thanks again for the  kind referral.  Chrystie Nose, MD, [facility]  [place]  [facility]  Medical Director of the Advanced Lipid Disorders &  Cardiovascular Risk Reduction Clinic Diplomate of the American Board of Clinical Lipidology Attending Cardiologist  Direct Dial: [phone]  Fax: [phone]  Website:  [email or web address] Nicely Blandon [email or web address] 11/06/2022, 11:01 AM

## 2022-11-12 ENCOUNTER — Telehealth (HOSPITAL_COMMUNITY): Payer: Self-pay | Admitting: Emergency Medicine

## 2022-11-12 LAB — BASIC METABOLIC PANEL
BUN/Creatinine Ratio: 19 (ref 12–28)
BUN: 15 mg/dL (ref 8–27)
CO2: 26 mmol/L (ref 20–29)
Calcium: 9.7 mg/dL (ref 8.7–10.3)
Chloride: 102 mmol/L (ref 96–106)
Creatinine, Ser: 0.79 mg/dL (ref 0.57–1.00)
Glucose: 74 mg/dL (ref 70–99)
Potassium: 4.7 mmol/L (ref 3.5–5.2)
Sodium: 142 mmol/L (ref 134–144)
eGFR: 84 mL/min/{1.73_m2} (ref >59)

## 2022-11-12 LAB — LIPOPROTEIN A (LPA): Lipoprotein (a): 202.3 nmol/L — ABNORMAL HIGH (ref <75.0)

## 2022-11-12 NOTE — Telephone Encounter (Signed)
Reaching out to patient to offer assistance regarding upcoming cardiac imaging study; pt verbalizes understanding of appt date/time, parking situation and where to check in, pre-test NPO status and medications ordered, and verified current allergies; name and call back number provided for further questions should they arise Sara Wallace RN Navigator Cardiac Imaging [facility] [phone] office [phone] cell 

## 2022-11-13 ENCOUNTER — Ambulatory Visit (HOSPITAL_COMMUNITY)
Admission: RE | Admit: 2022-11-13 | Discharge: 2022-11-13 | Disposition: A | Discharge status: Home / Self Care | Payer: 59 | Source: Ambulatory Visit | Attending: Internal Medicine | Admitting: Internal Medicine

## 2022-11-13 DIAGNOSIS — R079 Chest pain, unspecified: Secondary | ICD-10-CM | POA: Diagnosis present

## 2022-11-13 DIAGNOSIS — I700 Atherosclerosis of aorta: Secondary | ICD-10-CM

## 2022-11-13 LAB — POCT I-STAT CREATININE: Creatinine, Ser: 1 mg/dL (ref 0.44–1.00)

## 2022-11-13 MED ORDER — IOHEXOL 350 MG/ML SOLN
95.0000 mL | Freq: Once | INTRAVENOUS | Status: AC | PRN
  Administered 2022-11-13: 95 mL via INTRAVENOUS

## 2022-11-13 MED ORDER — NITROGLYCERIN 0.4 MG SL SUBL
0.8000 mg | SUBLINGUAL_TABLET | Freq: Once | SUBLINGUAL | Status: AC
  Administered 2022-11-13: 0.8 mg via SUBLINGUAL

## 2022-11-13 MED ORDER — NITROGLYCERIN 0.4 MG SL SUBL
SUBLINGUAL_TABLET | SUBLINGUAL | Status: AC
  Filled 2022-11-13: qty 2

## 2022-11-16 ENCOUNTER — Other Ambulatory Visit: Payer: Self-pay | Admitting: Family Medicine

## 2022-11-19 ENCOUNTER — Telehealth: Payer: Self-pay | Admitting: Family Medicine

## 2022-11-19 NOTE — Telephone Encounter (Signed)
Pt states she got a message she is due for her second shingles shot. Immunizations records show she had her shot in 202. Please advise pt if she is due.

## 2022-11-20 NOTE — Telephone Encounter (Signed)
Pt is not due. LDVM

## 2022-11-25 LAB — HM DEXA SCAN: HM Dexa Scan: NORMAL

## 2022-11-25 LAB — HM MAMMOGRAPHY

## 2022-11-26 ENCOUNTER — Encounter: Payer: Self-pay | Admitting: Family Medicine

## 2022-12-10 ENCOUNTER — Other Ambulatory Visit: Payer: Self-pay | Admitting: Family Medicine

## 2022-12-15 ENCOUNTER — Other Ambulatory Visit (HOSPITAL_BASED_OUTPATIENT_CLINIC_OR_DEPARTMENT_OTHER): Payer: Self-pay

## 2022-12-17 ENCOUNTER — Other Ambulatory Visit (HOSPITAL_COMMUNITY): Payer: Self-pay

## 2022-12-17 ENCOUNTER — Other Ambulatory Visit (HOSPITAL_BASED_OUTPATIENT_CLINIC_OR_DEPARTMENT_OTHER): Payer: Self-pay

## 2022-12-18 ENCOUNTER — Other Ambulatory Visit (HOSPITAL_BASED_OUTPATIENT_CLINIC_OR_DEPARTMENT_OTHER): Payer: Self-pay

## 2022-12-19 ENCOUNTER — Other Ambulatory Visit (HOSPITAL_BASED_OUTPATIENT_CLINIC_OR_DEPARTMENT_OTHER): Payer: Self-pay

## 2022-12-31 ENCOUNTER — Ambulatory Visit: Payer: 59 | Attending: Internal Medicine | Admitting: Internal Medicine

## 2022-12-31 ENCOUNTER — Encounter: Payer: Self-pay | Admitting: Internal Medicine

## 2022-12-31 VITALS — BP 124/76 | HR 68 | Ht 65.0 in | Wt 251.8 lb

## 2022-12-31 DIAGNOSIS — I10 Essential (primary) hypertension: Secondary | ICD-10-CM

## 2022-12-31 DIAGNOSIS — R079 Chest pain, unspecified: Secondary | ICD-10-CM

## 2022-12-31 DIAGNOSIS — E785 Hyperlipidemia, unspecified: Secondary | ICD-10-CM

## 2022-12-31 DIAGNOSIS — E7841 Elevated Lipoprotein(a): Secondary | ICD-10-CM

## 2022-12-31 NOTE — Progress Notes (Signed)
OFFICE CONSULT NOTE  Chief Complaint:  Chest pain  Primary Care Physician: Donato Schultz, DO  HPI:  Heather [name] is a 62 y.o. female who is being seen today for the evaluation of chest pain at the request of Donato Schultz, *.  This is a pleasant 62 year old female kindly referred for evaluation of chest pain.  She had an episode several months ago but reported she was under a lot of stress at the time.  She was taking care of her elderly mother who has unfortunately since passed.  She was seen by her PCP and noted to have an elevated D-dimer and subsequently underwent a CT angio of the chest at [facility] on Sep 18, 2022.  This demonstrated no pulmonary embolus however mild cardiomegaly and small bilateral pleural effusions.  No mention of coronary artery calcifications.  Since then she really has not had a lot of chest pain.  The pain was described as left-sided and radiating somewhat down her arm and to her back and up to her neck.  Not necessarily worse with exertion or relieved by rest.  She does have a lot of cardiovascular risk factors though including hypertension which is well-managed and dyslipidemia.  She has a strong family history of heart disease as well.  She also had a recent echo which showed normal LVEF of 60 to 65%, moderate left atrial enlargement and mild mitral regurgitation.  She was not aware of the mitral regurgitation and left atrial enlargement which we went over today.  She does have a history of A-fib in the family but does not endorse any palpitations.  EKG personally reviewed shows normal sinus rhythm.  PMHx:  Past Medical History:  Diagnosis Date   Acute bronchitis    Allergic rhinitis    Allergy    Anemia    Anxiety    Arthritis    knees   Back pain    Glaucoma    Hyperlipidemia    Hypertension    Joint pain    Leg edema    Neuromuscular disorder (HCC)    nerve issues in legs    Prediabetes     Past Surgical  History:  Procedure Laterality Date   ABDOMINAL HYSTERECTOMY  1998   BREAST LUMPECTOMY     CESAREAN SECTION     COLONOSCOPY     MYOMECTOMY     POLYPECTOMY      FAMHx:  Family History  Problem Relation Age of Onset   Dementia Father    Stroke Father    Atrial fibrillation Father    Alzheimer's disease Father    Hyperlipidemia Father    Hypertension Father    Heart disease Father    Prostate cancer Father 36   Hyperlipidemia Brother    Hypertension Brother    Colon polyps Brother    Hyperlipidemia Brother    Hypertension Brother    Hypertension Brother    Pulmonary embolism Brother    Cancer - Colon Brother 72       colon CA--stage 4   Colon cancer Brother    Colon cancer Cousin 19   Hypertension Mother    Hyperlipidemia Mother    Heart disease Mother    Endometrial cancer Other    Atrial fibrillation Other    Diabetes Maternal Aunt    Diabetes Maternal Uncle    Kidney disease Maternal Uncle        dialysis   Uterine cancer Maternal  Grandmother    Cancer Maternal Grandfather        pancreatic   Pancreatic cancer Maternal Grandfather    Esophageal cancer Neg Hx    Rectal cancer Neg Hx    Stomach cancer Neg Hx     SOCHx:   reports that she has never smoked. She has never used smokeless tobacco. She reports current alcohol use. She reports that she does not use drugs.  ALLERGIES:  Allergies  Allergen Reactions   Oseltamivir Phosphate Other (See Comments)    Couldn't walk. Blood shot eyes    ROS: Pertinent items noted in HPI and remainder of comprehensive ROS otherwise negative.  HOME MEDS: Current Outpatient Medications on File Prior to Visit  Medication Sig Dispense Refill   ALPRAZolam (XANAX) 0.25 MG tablet Take 1 tablet (0.25 mg total) by mouth 2 (two) times daily as needed for anxiety. 20 tablet 0   cholecalciferol (VITAMIN D3) 25 MCG (1000 UNIT) tablet Take 1,000 Units by mouth daily.     cyclobenzaprine (FLEXERIL) 10 MG tablet Take 1 tablet by  mouth three times daily as needed for muscle spasm 30 tablet 0   diclofenac sodium (VOLTAREN) 1 % GEL APPLY 4 GRAMS  TOPICALLY 4 TIMES DAILY 100 g 0   fluticasone (FLONASE) 50 MCG/ACT nasal spray Place 2 sprays into both nostrils daily. 48 g 1   folic acid (FOLVITE) 1 MG tablet Take 1 tablet (1 mg total) by mouth daily. 90 tablet 3   furosemide (LASIX) 20 MG tablet Take 1 tablet by mouth once daily 90 tablet 0   Lactobacillus (PROBIOTIC ACIDOPHILUS PO) Take by mouth.     latanoprost (XALATAN) 0.005 % ophthalmic solution      levocetirizine (XYZAL) 5 MG tablet Take 1 tablet (5 mg total) by mouth every evening. 90 tablet 1   lisinopril (ZESTRIL) 10 MG tablet Take 1 tablet (10 mg total) by mouth daily. 90 tablet 0   metoprolol tartrate (LOPRESSOR) 50 MG tablet Take 1 tablet (50 mg total) by mouth as directed. Take TWO HOURS before CT test 1 tablet 0   Multiple Vitamin (MULTIVITAMIN) tablet Take 1 tablet by mouth daily.     Naproxen Sod-diphenhydrAMINE (ALEVE PM PO) Take 1 tablet by mouth at bedtime as needed.     rosuvastatin (CRESTOR) 10 MG tablet Take 1 tablet by mouth once daily 90 tablet 0   tirzepatide (MOUNJARO) 5 MG/0.5ML Pen Inject 5 mg into the skin once a week. 6 mL 0   TURMERIC PO Take by mouth.     zinc gluconate 50 MG tablet Take 50 mg by mouth daily.     No current facility-administered medications on file prior to visit.    LABS/IMAGING: No results found for this or any previous visit (from the past 48 hour(s)). No results found.  LIPID PANEL:    Component Value Date/Time   CHOL 130 09/16/2022 1120   CHOL 215 (H) 09/24/2017 0921   TRIG 70.0 09/16/2022 1120   HDL 47.40 09/16/2022 1120   HDL 72 09/24/2017 0921   CHOLHDL 3 09/16/2022 1120   VLDL 14.0 09/16/2022 1120   LDLCALC 69 09/16/2022 1120   LDLCALC 118 (H) 04/18/2022 1619   LDLDIRECT 128.0 01/03/2013 0829    WEIGHTS: Wt Readings from Last 3 Encounters:  12/31/22 251 lb 12.8 oz (114.2 kg)  11/06/22 255 lb 6.4  oz (115.8 kg)  09/16/22 262 lb (118.8 kg)    VITALS: BP 124/76 (BP Location: Left Arm, Patient Position: Sitting, Cuff  Size: Large)   Pulse 68   Ht 5\' 5"  (1.651 m)   Wt 251 lb 12.8 oz (114.2 kg)   SpO2 96%   BMI 41.90 kg/m   EXAM: General appearance: alert, no distress, and morbidly obese Neck: no carotid bruit, no JVD, and thyroid not enlarged, symmetric, no tenderness/mass/nodules Lungs: clear to auscultation bilaterally Heart: regular rate and rhythm Abdomen: soft, non-tender; bowel sounds normal; no masses,  no organomegaly Extremities: extremities normal, atraumatic, no cyanosis or edema Pulses: 2+ and symmetric Skin: Skin color, texture, turgor normal. 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   Ms. Entwisle had a reassuring coronary CT angiogram with a calcium score of 2 and minimal nonobstructive coronary disease.  She does have a high LP(a) however LDL is less than 70 on statin and she would not really qualify for PCSK9 inhibitor at this point.  She may however be a candidate for preventative LP(a) lowering trial.  Will hold onto her name for that.  Blood pressure is well-controlled.  Plan follow-up with me in 6 months or sooner as necessary.  Chrystie Nose, MD, [facility], FACP  [place]  [facility]  Medical Director of the Advanced Lipid Disorders &  Cardiovascular Risk Reduction Clinic Diplomate of the American Board of Clinical Lipidology Attending Cardiologist  Direct Dial: [phone]  Fax: [phone]  Website:  [email or web address] Nicely Chaley Castellanos 12/31/2022, 9:45 AM

## 2022-12-31 NOTE — Patient Instructions (Signed)
Medication Instructions:  NO CHANGES  *If you need a refill on your cardiac medications before your next appointment, please call your pharmacy*    Follow-Up: At [facility], you and your health needs are our priority.  As part of our continuing mission to provide you with exceptional heart care, we have created designated Provider Care Teams.  These Care Teams include your primary Cardiologist (physician) and Advanced Practice Providers (APPs -  Physician Assistants and Nurse Practitioners) who all work together to provide you with the care you need, when you need it.  We recommend signing up for the patient portal called "MyChart".  Sign up information is provided on this After Visit Summary.  MyChart is used to connect with patients for Virtual Visits (Telemedicine).  Patients are able to view lab/test results, encounter notes, upcoming appointments, etc.  Non-urgent messages can be sent to your provider as well.   To learn more about what you can do with MyChart, go to [email or web address].    Your next appointment:    6 months with Dr. Debara Pickett

## 2023-01-07 ENCOUNTER — Other Ambulatory Visit: Payer: Self-pay | Admitting: Family Medicine

## 2023-01-07 DIAGNOSIS — R600 Localized edema: Secondary | ICD-10-CM

## 2023-01-13 ENCOUNTER — Other Ambulatory Visit: Payer: Self-pay | Admitting: Family Medicine

## 2023-01-13 DIAGNOSIS — E119 Type 2 diabetes mellitus without complications: Secondary | ICD-10-CM

## 2023-01-14 ENCOUNTER — Other Ambulatory Visit: Payer: Self-pay | Admitting: Family

## 2023-01-14 MED ORDER — TIRZEPATIDE 7.5 MG/0.5ML ~~LOC~~ SOAJ: 7.5000 mg | SUBCUTANEOUS | 1 refills | Status: DC

## 2023-01-16 ENCOUNTER — Other Ambulatory Visit (HOSPITAL_BASED_OUTPATIENT_CLINIC_OR_DEPARTMENT_OTHER): Payer: Self-pay

## 2023-01-23 ENCOUNTER — Other Ambulatory Visit (HOSPITAL_BASED_OUTPATIENT_CLINIC_OR_DEPARTMENT_OTHER): Payer: Self-pay

## 2023-01-23 ENCOUNTER — Encounter: Payer: Self-pay | Admitting: Family Medicine

## 2023-01-23 ENCOUNTER — Ambulatory Visit: Payer: 59 | Admitting: Family Medicine

## 2023-01-23 VITALS — BP 120/80 | HR 78 | Temp 98.1°F | Resp 18 | Ht 65.0 in | Wt 251.0 lb

## 2023-01-23 DIAGNOSIS — E785 Hyperlipidemia, unspecified: Secondary | ICD-10-CM

## 2023-01-23 DIAGNOSIS — Z23 Encounter for immunization: Secondary | ICD-10-CM

## 2023-01-23 DIAGNOSIS — I10 Essential (primary) hypertension: Secondary | ICD-10-CM | POA: Diagnosis not present

## 2023-01-23 DIAGNOSIS — E119 Type 2 diabetes mellitus without complications: Secondary | ICD-10-CM

## 2023-01-23 DIAGNOSIS — E1169 Type 2 diabetes mellitus with other specified complication: Secondary | ICD-10-CM | POA: Diagnosis not present

## 2023-01-23 LAB — LIPID PANEL
Cholesterol: 160 mg/dL (ref 0–200)
HDL: 57.1 mg/dL (ref >39.00)
LDL Cholesterol: 89 mg/dL (ref 0–99)
NonHDL: 102.55
Total CHOL/HDL Ratio: 3
Triglycerides: 66 mg/dL (ref 0.0–149.0)
VLDL: 13.2 mg/dL (ref 0.0–40.0)

## 2023-01-23 LAB — CBC WITH DIFFERENTIAL/PLATELET
Basophils Absolute: 0 10*3/uL (ref 0.0–0.1)
Basophils Relative: 0.9 % (ref 0.0–3.0)
Eosinophils Absolute: 0.1 10*3/uL (ref 0.0–0.7)
Eosinophils Relative: 1.3 % (ref 0.0–5.0)
HCT: 40.5 % (ref 36.0–46.0)
Hemoglobin: 12.8 g/dL (ref 12.0–15.0)
Lymphocytes Relative: 41.6 % (ref 12.0–46.0)
Lymphs Abs: 2.1 10*3/uL (ref 0.7–4.0)
MCHC: 31.6 g/dL (ref 30.0–36.0)
MCV: 77.2 fL — ABNORMAL LOW (ref 78.0–100.0)
Monocytes Absolute: 0.4 10*3/uL (ref 0.1–1.0)
Monocytes Relative: 7.4 % (ref 3.0–12.0)
Neutro Abs: 2.5 10*3/uL (ref 1.4–7.7)
Neutrophils Relative %: 48.8 % (ref 43.0–77.0)
Platelets: 189 10*3/uL (ref 150.0–400.0)
RBC: 5.25 Mil/uL — ABNORMAL HIGH (ref 3.87–5.11)
RDW: 16 % — ABNORMAL HIGH (ref 11.5–15.5)
WBC: 5.1 10*3/uL (ref 4.0–10.5)

## 2023-01-23 LAB — COMPREHENSIVE METABOLIC PANEL
ALT: 11 U/L (ref 0–35)
AST: 13 U/L (ref 0–37)
Albumin: 4 g/dL (ref 3.5–5.2)
Alkaline Phosphatase: 64 U/L (ref 39–117)
BUN: 13 mg/dL (ref 6–23)
CO2: 33 meq/L — ABNORMAL HIGH (ref 19–32)
Calcium: 9.8 mg/dL (ref 8.4–10.5)
Chloride: 101 meq/L (ref 96–112)
Creatinine, Ser: 0.8 mg/dL (ref 0.40–1.20)
GFR: 78.35 mL/min (ref >60.00)
Glucose, Bld: 78 mg/dL (ref 70–99)
Potassium: 4.6 meq/L (ref 3.5–5.1)
Sodium: 141 meq/L (ref 135–145)
Total Bilirubin: 0.4 mg/dL (ref 0.2–1.2)
Total Protein: 6.9 g/dL (ref 6.0–8.3)

## 2023-01-23 LAB — TSH: TSH: 0.95 u[IU]/mL (ref 0.35–5.50)

## 2023-01-23 LAB — HEMOGLOBIN A1C: Hgb A1c MFr Bld: 5.7 % (ref 4.6–6.5)

## 2023-01-23 MED ORDER — COVID-19 MRNA VAC-TRIS(PFIZER) 30 MCG/0.3ML IM SUSY
0.3000 mL | PREFILLED_SYRINGE | Freq: Once | INTRAMUSCULAR | 0 refills | Status: AC
  Filled 2023-01-23: qty 0.3, 1d supply, fill #0

## 2023-01-23 NOTE — Assessment & Plan Note (Signed)
hgba1c acceptable, minimize simple carbs. Increase exercise as tolerated. Continue current meds 

## 2023-01-23 NOTE — Progress Notes (Signed)
Established Patient Office Visit  Subjective   Patient ID: Heather [name], female    DOB: [birth year]  Age: 62 y.o. MRN: [id number]  Chief Complaint  Patient presents with   Hypertension   Hyperlipidemia   Follow-up    HPI Discussed the use of AI scribe software for clinical note transcription with the patient, who gave verbal consent to proceed.  History of Present Illness   The patient presents for a six month follow up. She reports feeling well and has no new complaints. She recently had a follow up with her cardiologist in [place], who found something that will require monitoring, but it was not a major concern. Her blood work was good and she has no issues with her current medications. She has received her COVID-19 vaccine with no complications. She has been losing weight slowly and continues to do five minute exercises regularly. She has been adding extra protein to her diet. She has not had any issues with constipation, but takes Colace once a week as a preventative measure. She recently lost her mother to a bowel obstruction, which has made her more conscious of her bowel movements.      Patient Active Problem List   Diagnosis Date Noted   Anxiety 09/16/2022   Abscessed tooth 04/18/2022   Lower extremity edema 04/18/2022   Hyperlipidemia 04/18/2022   Suprapubic pain 04/18/2022   Class 3 severe obesity with serious comorbidity and body mass index (BMI) of 40.0 to 44.9 in adult, unspecified obesity type (HCC) 07/08/2021   Acute swimmer's ear of right side 12/03/2020   Alpha thalassemia (HCC) 07/02/2020   Head trauma 01/17/2020   Abnormal CBC 01/17/2020   Allergies 02/24/2018   Diet-controlled diabetes mellitus (HCC) 02/24/2018   Preventative health care 06/29/2016   CAD (coronary artery disease) 03/09/2014   Obesity (BMI 30-39.9) 01/07/2013   Acute non-recurrent pansinusitis 04/19/2009   GLAUCOMA 08/24/2008   HYPERGLYCEMIA, FASTING 08/24/2008   POSTMENOPAUSAL  STATUS 08/24/2008   EDEMA 06/07/2007   Primary hypertension 08/05/2006   ALLERGIC RHINITIS 08/05/2006   Hyperlipidemia associated with type 2 diabetes mellitus (HCC)    Chest pain    Past Medical History:  Diagnosis Date   Acute bronchitis    Allergic rhinitis    Allergy    Anemia    Anxiety    Arthritis    knees   Back pain    Glaucoma    Hyperlipidemia    Hypertension    Joint pain    Leg edema    Neuromuscular disorder (HCC)    nerve issues in legs    Prediabetes    Past Surgical History:  Procedure Laterality Date   ABDOMINAL HYSTERECTOMY  1998   BREAST LUMPECTOMY     CESAREAN SECTION     COLONOSCOPY     MYOMECTOMY     POLYPECTOMY     Social History   Tobacco Use   Smoking status: Never   Smokeless tobacco: Never  Vaping Use   Vaping status: Never Used  Substance Use Topics   Alcohol use: Yes    Alcohol/week: 0.0 standard drinks of alcohol    Comment: occ   Drug use: No   Social History   Socioeconomic History   Marital status: Single    Spouse name: Not on file   Number of children: Not on file   Years of education: 20   Highest education level: Master's degree (e.g., MA, MS, MEng, MEd, MSW, MBA)  Occupational History  Occupation: IT consultant lab  Tobacco Use   Smoking status: Never   Smokeless tobacco: Never  Vaping Use   Vaping status: Never Used  Substance and Sexual Activity   Alcohol use: Yes    Alcohol/week: 0.0 standard drinks of alcohol    Comment: occ   Drug use: No   Sexual activity: Yes    Partners: Male  Other Topics Concern   Not on file  Social History Narrative   Exercise-- every night 5 min fat burning exercise    Social Determinants of Health   Financial Resource Strain: Low Risk  (01/19/2023)   Overall Financial Resource Strain (CARDIA)    Difficulty of Paying Living Expenses: Not hard at all  Food Insecurity: No Food Insecurity (01/19/2023)   Hunger Vital Sign    Worried About Running Out of  Food in the Last Year: Never true    Ran Out of Food in the Last Year: Never true  Transportation Needs: No Transportation Needs (01/19/2023)   PRAPARE - Administrator, Civil Service (Medical): No    Lack of Transportation (Non-Medical): No  Physical Activity: Insufficiently Active (01/19/2023)   Exercise Vital Sign    Days of Exercise per Week: 5 days    Minutes of Exercise per Session: 10 min  Stress: No Stress Concern Present (01/19/2023)   [employer] of Occupational Health - Occupational Stress Questionnaire    Feeling of Stress : Only a little  Social Connections: Moderately Integrated (01/19/2023)   Social Connection and Isolation Panel [NHANES]    Frequency of Communication with Friends and Family: Twice a week    Frequency of Social Gatherings with Friends and Family: Once a week    Attends Religious Services: 1 to 4 times per year    Active Member of [employer] or Organizations: Yes    Attends Engineer, structural: More than 4 times per year    Marital Status: Divorced  Catering manager Violence: Not on file   Family Status  Relation Name Status   Father  Deceased   Brother  Alive   Brother  Alive   Brother  Alive   Brother stephen Deceased at age 48   Cousin  Alive   Mother  Alive   Other  (Not Specified)   Other  (Not Specified)   Youth worker  (Not Specified)   Mat Uncle  (Not Specified)   MGM  (Not Specified)   MGF  (Not Specified)   Neg Hx  (Not Specified)  No partnership data on file   Family History  Problem Relation Age of Onset   Dementia Father    Stroke Father    Atrial fibrillation Father    Alzheimer's disease Father    Hyperlipidemia Father    Hypertension Father    Heart disease Father    Prostate cancer Father 27   Hyperlipidemia Brother    Hypertension Brother    Colon polyps Brother    Hyperlipidemia Brother    Hypertension Brother    Hypertension Brother    Pulmonary embolism Brother    Cancer - Colon Brother 69        colon CA--stage 4   Colon cancer Brother    Colon cancer Cousin 6   Hypertension Mother    Hyperlipidemia Mother    Heart disease Mother    Endometrial cancer Other    Atrial fibrillation Other    Diabetes Maternal Aunt    Diabetes Maternal Uncle  Kidney disease Maternal Uncle        dialysis   Uterine cancer Maternal Grandmother    Cancer Maternal Grandfather        pancreatic   Pancreatic cancer Maternal Grandfather    Esophageal cancer Neg Hx    Rectal cancer Neg Hx    Stomach cancer Neg Hx    Allergies  Allergen Reactions   Oseltamivir Phosphate Other (See Comments)    Couldn't walk. Blood shot eyes      Review of Systems  Constitutional:  Negative for chills, fever and malaise/fatigue.  HENT:  Negative for congestion and hearing loss.   Eyes:  Negative for blurred vision and discharge.  Respiratory:  Negative for cough, sputum production and shortness of breath.   Cardiovascular:  Negative for chest pain, palpitations and leg swelling.  Gastrointestinal:  Negative for abdominal pain, blood in stool, constipation, diarrhea, heartburn, nausea and vomiting.  Genitourinary:  Negative for dysuria, frequency, hematuria and urgency.  Musculoskeletal:  Negative for back pain, falls and myalgias.  Skin:  Negative for rash.  Neurological:  Negative for dizziness, sensory change, loss of consciousness, weakness and headaches.  Endo/Heme/Allergies:  Negative for environmental allergies. Does not bruise/bleed easily.  Psychiatric/Behavioral:  Negative for depression and suicidal ideas. The patient is not nervous/anxious and does not have insomnia.       Objective:     BP 120/80 (BP Location: Left Arm, Patient Position: Sitting, Cuff Size: Large)   Pulse 78   Temp 98.1 F (36.7 C) (Oral)   Resp 18   Ht 5\' 5"  (1.651 m)   Wt 251 lb (113.9 kg)   SpO2 98%   BMI 41.77 kg/m  BP Readings from Last 3 Encounters:  01/23/23 120/80  12/31/22 124/76  11/13/22 128/72   Wt  Readings from Last 3 Encounters:  01/23/23 251 lb (113.9 kg)  12/31/22 251 lb 12.8 oz (114.2 kg)  11/06/22 255 lb 6.4 oz (115.8 kg)   SpO2 Readings from Last 3 Encounters:  01/23/23 98%  12/31/22 96%  11/06/22 98%      Physical Exam Vitals and nursing note reviewed.  Constitutional:      General: She is not in acute distress.    Appearance: Normal appearance. She is well-developed.  HENT:     Head: Normocephalic and atraumatic.     Right Ear: Tympanic membrane, ear canal and external ear normal. There is no impacted cerumen.     Left Ear: Tympanic membrane, ear canal and external ear normal. There is no impacted cerumen.     Nose: Nose normal.     Mouth/Throat:     Mouth: Mucous membranes are moist.     Pharynx: Oropharynx is clear. No oropharyngeal exudate or posterior oropharyngeal erythema.  Eyes:     General: No scleral icterus.       Right eye: No discharge.        Left eye: No discharge.     Conjunctiva/sclera: Conjunctivae normal.     Pupils: Pupils are equal, round, and reactive to light.  Neck:     Thyroid: No thyromegaly or thyroid tenderness.     Vascular: No JVD.  Cardiovascular:     Rate and Rhythm: Normal rate and regular rhythm.     Heart sounds: Normal heart sounds. No murmur heard. Pulmonary:     Effort: Pulmonary effort is normal. No respiratory distress.     Breath sounds: Normal breath sounds.  Abdominal:     General: Bowel sounds are  normal. There is no distension.     Palpations: Abdomen is soft. There is no mass.     Tenderness: There is no abdominal tenderness. There is no guarding or rebound.  Genitourinary:    Vagina: Normal.  Musculoskeletal:        General: Normal range of motion.     Cervical back: Normal range of motion and neck supple.     Right lower leg: No edema.     Left lower leg: No edema.  Lymphadenopathy:     Cervical: No cervical adenopathy.  Skin:    General: Skin is warm and dry.     Findings: No erythema or rash.   Neurological:     Mental Status: She is alert and oriented to person, place, and time.     Cranial Nerves: No cranial nerve deficit.     Deep Tendon Reflexes: Reflexes are normal and symmetric.  Psychiatric:        Mood and Affect: Mood normal.        Behavior: Behavior normal.        Thought Content: Thought content normal.        Judgment: Judgment normal.      No results found for any visits on 01/23/23.  Last CBC Lab Results  Component Value Date   WBC 5.1 09/16/2022   HGB 12.3 09/16/2022   HCT 38.7 09/16/2022   MCV 76.7 (L) 09/16/2022   MCH 24.9 (L) 04/18/2022   RDW 15.1 09/16/2022   PLT 152.0 09/16/2022   Last metabolic panel Lab Results  Component Value Date   GLUCOSE 74 11/11/2022   NA 142 11/11/2022   K 4.7 11/11/2022   CL 102 11/11/2022   CO2 26 11/11/2022   BUN 15 11/11/2022   CREATININE 1.00 11/13/2022   EGFR 84 11/11/2022   CALCIUM 9.7 11/11/2022   PROT 6.6 09/16/2022   ALBUMIN 3.7 09/16/2022   LABGLOB 3.1 09/24/2017   AGRATIO 1.4 09/24/2017   BILITOT 0.3 09/16/2022   ALKPHOS 52 09/16/2022   AST 14 09/16/2022   ALT 12 09/16/2022   ANIONGAP 7 02/16/2020   Last lipids Lab Results  Component Value Date   CHOL 130 09/16/2022   HDL 47.40 09/16/2022   LDLCALC 69 09/16/2022   LDLDIRECT 128.0 01/03/2013   TRIG 70.0 09/16/2022   CHOLHDL 3 09/16/2022   Last hemoglobin A1c Lab Results  Component Value Date   HGBA1C 6.2 07/17/2022   Last thyroid functions Lab Results  Component Value Date   TSH 1.52 09/16/2022   T3TOTAL 114 09/24/2017   Last vitamin D Lab Results  Component Value Date   VD25OH 34.1 09/24/2017   Last vitamin B12 and Folate Lab Results  Component Value Date   VITAMINB12 914 02/16/2020   FOLATE 17.1 02/16/2020      The 10-year ASCVD risk score (Arnett DK, et al., 2019) is: 11.4%    Assessment & Plan:   Problem List Items Addressed This Visit       Unprioritized   Primary hypertension    Well controlled, no  changes to meds. Encouraged heart healthy diet such as the DASH diet and exercise as tolerated.        Relevant Orders   CBC with Differential/Platelet   Comprehensive metabolic panel   Lipid panel   TSH   Hyperlipidemia associated with type 2 diabetes mellitus (HCC) - Primary    Encourage heart healthy diet such as MIND or DASH diet, increase exercise, avoid trans fats, simple  carbohydrates and processed foods, consider a krill or fish or flaxseed oil cap daily.        Relevant Orders   Comprehensive metabolic panel   Lipid panel   Diet-controlled diabetes mellitus (HCC)    hgba1c acceptable, minimize simple carbs. Increase exercise as tolerated. Continue current meds       Other Visit Diagnoses     Need for influenza vaccination       Relevant Orders   Flu vaccine trivalent PF, 6mos and older(Flulaval,Afluria,Fluarix,Fluzone) (Completed)   Diabetes mellitus type II, non insulin dependent (HCC)       Relevant Orders   Comprehensive metabolic panel   Hemoglobin A1c   Morbid obesity (HCC)         Assessment and Plan    Hyperlipidemia with elevated Lipoprotein(a) LDL less than 70 on Crestor. Discussed potential for inclusion in a preventative LPA lowering trial. -Continue current medication regimen. -Consider participation in LPA lowering trial if eligible.  Obesity Weight loss of 11 lbs since last visit. Patient is incorporating additional protein into diet and exercising. -Continue current weight loss strategies.  General Health Maintenance Completed COVID-19 vaccination with no complications. -Continue current medications. -Consider daily Colace for bowel regularity.        Return in about 6 months (around 07/24/2023), or if symptoms worsen or fail to improve, for fasting, annual exam.    Donato Schultz, DO

## 2023-01-23 NOTE — Assessment & Plan Note (Signed)
Encourage heart healthy diet such as MIND or DASH diet, increase exercise, avoid trans fats, simple carbohydrates and processed foods, consider a krill or fish or flaxseed oil cap daily.  °

## 2023-01-23 NOTE — Assessment & Plan Note (Signed)
Well controlled, no changes to meds. Encouraged heart healthy diet such as the DASH diet and exercise as tolerated.  °

## 2023-01-23 NOTE — Patient Instructions (Signed)

## 2023-01-24 ENCOUNTER — Other Ambulatory Visit: Payer: Self-pay | Admitting: Family Medicine

## 2023-01-24 DIAGNOSIS — R600 Localized edema: Secondary | ICD-10-CM

## 2023-01-26 MED ORDER — FUROSEMIDE 20 MG PO TABS: 20.0000 mg | ORAL_TABLET | Freq: Every day | ORAL | 1 refills | Status: DC

## 2023-02-10 ENCOUNTER — Encounter: Payer: Self-pay | Admitting: Family Medicine

## 2023-02-10 MED ORDER — TIRZEPATIDE 7.5 MG/0.5ML ~~LOC~~ SOAJ: 7.5000 mg | SUBCUTANEOUS | 1 refills | Status: DC

## 2023-02-13 ENCOUNTER — Other Ambulatory Visit: Payer: Self-pay | Admitting: Family Medicine

## 2023-02-13 DIAGNOSIS — I10 Essential (primary) hypertension: Secondary | ICD-10-CM

## 2023-03-02 ENCOUNTER — Other Ambulatory Visit: Payer: Self-pay | Admitting: Family Medicine

## 2023-05-01 ENCOUNTER — Other Ambulatory Visit: Payer: Self-pay | Admitting: Family Medicine

## 2023-05-31 ENCOUNTER — Other Ambulatory Visit: Payer: Self-pay | Admitting: Family Medicine

## 2023-06-15 ENCOUNTER — Ambulatory Visit: Payer: 59 | Admitting: Family Medicine

## 2023-06-15 ENCOUNTER — Encounter: Payer: Self-pay | Admitting: Family Medicine

## 2023-06-15 ENCOUNTER — Other Ambulatory Visit (HOSPITAL_BASED_OUTPATIENT_CLINIC_OR_DEPARTMENT_OTHER): Payer: Self-pay

## 2023-06-15 VITALS — BP 130/8 | HR 73 | Temp 98.4°F | Resp 18 | Ht 65.0 in | Wt 245.2 lb

## 2023-06-15 DIAGNOSIS — T7840XD Allergy, unspecified, subsequent encounter: Secondary | ICD-10-CM

## 2023-06-15 DIAGNOSIS — J0140 Acute pansinusitis, unspecified: Secondary | ICD-10-CM | POA: Diagnosis not present

## 2023-06-15 MED ORDER — LEVOCETIRIZINE DIHYDROCHLORIDE 5 MG PO TABS
5.0000 mg | ORAL_TABLET | Freq: Every evening | ORAL | 5 refills | Status: AC
  Filled 2023-06-15: qty 30, 30d supply, fill #0

## 2023-06-15 MED ORDER — AMOXICILLIN-POT CLAVULANATE 875-125 MG PO TABS
1.0000 | ORAL_TABLET | Freq: Two times a day (BID) | ORAL | 0 refills | Status: DC
Start: 2023-06-15 — End: 2023-07-24
  Filled 2023-06-15: qty 20, 10d supply, fill #0

## 2023-06-15 MED ORDER — CETIRIZINE HCL 10 MG PO TABS
10.0000 mg | ORAL_TABLET | Freq: Every day | ORAL | 11 refills | Status: DC
Start: 2023-06-15 — End: 2023-06-15
  Filled 2023-06-15: qty 30, 30d supply, fill #0

## 2023-06-15 NOTE — Progress Notes (Signed)
 Established Patient Office Visit  Subjective   Patient ID: Heather [name], female    DOB: 1960/05/10  Age: 63 y.o. MRN: [id number]  Chief Complaint  Patient presents with   Sinus Problem    X2 weeks ago, pt states it felt like she has bump in her nose and sneezing. Pt states otc flonase and zyrtec. Pt states over the weekend having pain going down her neck.     HPI Discussed the use of AI scribe software for clinical note transcription with the patient, who gave verbal consent to proceed.  History of Present Illness   [name] Talamantez "[name]" is a 63 year old female who presents with sinus headaches and pressure.  She began experiencing sinus headaches and pressure last week, describing the sensation as 'all around'. These symptoms have persisted despite the use of Flonase. No coughing, but there is sneezing and drainage down her neck.  Two weeks ago, she started with sneezing and a sensation of something in her nose that she could not blow out. Initial use of Flonase alleviated these symptoms, but she then developed 'bronchitis sneezes', which resolved on their own.  Over the weekend, she noticed symptoms starting in her neck and affecting her ear, which feels sensitive to cold air. However, she does not feel like her ear is full or underwater.  She has been taking Zyrtec for a long time and continues to use Flonase. She mentions not switching to Xyzal, which she previously used when Zyrtec went over the counter. She has previously used Augmentin without any issues. She has received a flu shot and has not had COVID or the flu.     ] Patient Active Problem List   Diagnosis Date Noted   Anxiety 09/16/2022   Abscessed tooth 04/18/2022   Lower extremity edema 04/18/2022   Hyperlipidemia 04/18/2022   Suprapubic pain 04/18/2022   Class 3 severe obesity with serious comorbidity and body mass index (BMI) of 40.0 to 44.9 in adult, unspecified obesity type (HCC) 07/08/2021   Acute  swimmer's ear of right side 12/03/2020   Alpha thalassemia (HCC) 07/02/2020   Head trauma 01/17/2020   Abnormal CBC 01/17/2020   Allergies 02/24/2018   Diet-controlled diabetes mellitus (HCC) 02/24/2018   Preventative health care 06/29/2016   CAD (coronary artery disease) 03/09/2014   Obesity (BMI 30-39.9) 01/07/2013   Acute non-recurrent pansinusitis 04/19/2009   GLAUCOMA 08/24/2008   HYPERGLYCEMIA, FASTING 08/24/2008   POSTMENOPAUSAL STATUS 08/24/2008   EDEMA 06/07/2007   Primary hypertension 08/05/2006   ALLERGIC RHINITIS 08/05/2006   Hyperlipidemia associated with type 2 diabetes mellitus (HCC)    Chest pain    Past Medical History:  Diagnosis Date   Acute bronchitis    Allergic rhinitis    Allergy    Anemia    Anxiety    Arthritis    knees   Back pain    Glaucoma    Hyperlipidemia    Hypertension    Joint pain    Leg edema    Neuromuscular disorder (HCC)    nerve issues in legs    Prediabetes    Past Surgical History:  Procedure Laterality Date   ABDOMINAL HYSTERECTOMY  1998   BREAST LUMPECTOMY     CESAREAN SECTION     COLONOSCOPY     MYOMECTOMY     POLYPECTOMY     Social History   Tobacco Use   Smoking status: Never   Smokeless tobacco: Never  Vaping Use   Vaping  status: Never Used  Substance Use Topics   Alcohol use: Yes    Alcohol/week: 0.0 standard drinks of alcohol    Comment: occ   Drug use: No   Social History   Socioeconomic History   Marital status: Single    Spouse name: Not on file   Number of children: Not on file   Years of education: 20   Highest education level: Master's degree (e.g., MA, MS, MEng, MEd, MSW, MBA)  Occupational History   Occupation: IT consultant lab  Tobacco Use   Smoking status: Never   Smokeless tobacco: Never  Vaping Use   Vaping status: Never Used  Substance and Sexual Activity   Alcohol use: Yes    Alcohol/week: 0.0 standard drinks of alcohol    Comment: occ   Drug use: No    Sexual activity: Yes    Partners: Male  Other Topics Concern   Not on file  Social History Narrative   Exercise-- every night 5 min fat burning exercise    Social Drivers of Health   Financial Resource Strain: Low Risk  (01/19/2023)   Overall Financial Resource Strain (CARDIA)    Difficulty of Paying Living Expenses: Not hard at all  Food Insecurity: No Food Insecurity (01/19/2023)   Hunger Vital Sign    Worried About Running Out of Food in the Last Year: Never true    Ran Out of Food in the Last Year: Never true  Transportation Needs: No Transportation Needs (01/19/2023)   PRAPARE - Administrator, Civil Service (Medical): No    Lack of Transportation (Non-Medical): No  Physical Activity: Insufficiently Active (01/19/2023)   Exercise Vital Sign    Days of Exercise per Week: 5 days    Minutes of Exercise per Session: 10 min  Stress: No Stress Concern Present (01/19/2023)   [employer] of Occupational Health - Occupational Stress Questionnaire    Feeling of Stress : Only a little  Social Connections: Moderately Integrated (01/19/2023)   Social Connection and Isolation Panel [NHANES]    Frequency of Communication with Friends and Family: Twice a week    Frequency of Social Gatherings with Friends and Family: Once a week    Attends Religious Services: 1 to 4 times per year    Active Member of [employer] or Organizations: Yes    Attends Engineer, structural: More than 4 times per year    Marital Status: Divorced  Catering manager Violence: Not on file   Family Status  Relation Name Status   Father  Deceased   Brother  Alive   Brother  Alive   Brother  Alive   Brother stephen Deceased at age 35   Cousin  Alive   Mother  Alive   Other  (Not Specified)   Other  (Not Specified)   Youth worker  (Not Specified)   Mat Uncle  (Not Specified)   MGM  (Not Specified)   MGF  (Not Specified)   Neg Hx  (Not Specified)  No partnership data on file   Family History   Problem Relation Age of Onset   Dementia Father    Stroke Father    Atrial fibrillation Father    Alzheimer's disease Father    Hyperlipidemia Father    Hypertension Father    Heart disease Father    Prostate cancer Father 10   Hyperlipidemia Brother    Hypertension Brother    Colon polyps Brother    Hyperlipidemia Brother  Hypertension Brother    Hypertension Brother    Pulmonary embolism Brother    Cancer - Colon Brother 52       colon CA--stage 4   Colon cancer Brother    Colon cancer Cousin 8   Hypertension Mother    Hyperlipidemia Mother    Heart disease Mother    Endometrial cancer Other    Atrial fibrillation Other    Diabetes Maternal Aunt    Diabetes Maternal Uncle    Kidney disease Maternal Uncle        dialysis   Uterine cancer Maternal Grandmother    Cancer Maternal Grandfather        pancreatic   Pancreatic cancer Maternal Grandfather    Esophageal cancer Neg Hx    Rectal cancer Neg Hx    Stomach cancer Neg Hx    Allergies  Allergen Reactions   Oseltamivir Phosphate Other (See Comments)    Couldn't walk. Blood shot eyes      Review of Systems  Constitutional:  Negative for fever and malaise/fatigue.  HENT:  Negative for congestion.   Eyes:  Negative for blurred vision.  Respiratory:  Negative for shortness of breath.   Cardiovascular:  Negative for chest pain, palpitations and leg swelling.  Gastrointestinal:  Negative for abdominal pain, blood in stool and nausea.  Genitourinary:  Negative for dysuria and frequency.  Musculoskeletal:  Negative for falls.  Skin:  Negative for rash.  Neurological:  Negative for dizziness, loss of consciousness and headaches.  Endo/Heme/Allergies:  Negative for environmental allergies.  Psychiatric/Behavioral:  Negative for depression. The patient is not nervous/anxious.       Objective:     BP (!) 130/8 (BP Location: Right Arm, Patient Position: Sitting, Cuff Size: Large)   Pulse 73   Temp 98.4 F  (36.9 C) (Oral)   Resp 18   Ht 5\' 5"  (1.651 m)   Wt 245 lb 3.2 oz (111.2 kg)   SpO2 98%   BMI 40.80 kg/m  BP Readings from Last 3 Encounters:  06/15/23 (!) 130/8  01/23/23 120/80  12/31/22 124/76   Wt Readings from Last 3 Encounters:  06/15/23 245 lb 3.2 oz (111.2 kg)  01/23/23 251 lb (113.9 kg)  12/31/22 251 lb 12.8 oz (114.2 kg)   SpO2 Readings from Last 3 Encounters:  06/15/23 98%  01/23/23 98%  12/31/22 96%      Physical Exam Vitals and nursing note reviewed.  Constitutional:      General: She is not in acute distress.    Appearance: Normal appearance. She is well-developed.  HENT:     Head: Normocephalic and atraumatic.     Nose:     Right Sinus: Maxillary sinus tenderness and frontal sinus tenderness present.     Left Sinus: Maxillary sinus tenderness and frontal sinus tenderness present.  Eyes:     General: No scleral icterus.       Right eye: No discharge.        Left eye: No discharge.  Cardiovascular:     Rate and Rhythm: Normal rate and regular rhythm.     Heart sounds: No murmur heard. Pulmonary:     Effort: Pulmonary effort is normal. No respiratory distress.     Breath sounds: Normal breath sounds.  Musculoskeletal:        General: Normal range of motion.     Cervical back: Normal range of motion and neck supple.     Right lower leg: No edema.  Left lower leg: No edema.  Skin:    General: Skin is warm and dry.  Neurological:     Mental Status: She is alert and oriented to person, place, and time.  Psychiatric:        Mood and Affect: Mood normal.        Behavior: Behavior normal.        Thought Content: Thought content normal.        Judgment: Judgment normal.      No results found for any visits on 06/15/23.  Last CBC Lab Results  Component Value Date   WBC 5.1 01/23/2023   HGB 12.8 01/23/2023   HCT 40.5 01/23/2023   MCV 77.2 (L) 01/23/2023   MCH 24.9 (L) 04/18/2022   RDW 16.0 (H) 01/23/2023   PLT 189.0 01/23/2023   Last  metabolic panel Lab Results  Component Value Date   GLUCOSE 78 01/23/2023   NA 141 01/23/2023   K 4.6 01/23/2023   CL 101 01/23/2023   CO2 33 (H) 01/23/2023   BUN 13 01/23/2023   CREATININE 0.80 01/23/2023   GFR 78.35 01/23/2023   CALCIUM 9.8 01/23/2023   PROT 6.9 01/23/2023   ALBUMIN 4.0 01/23/2023   LABGLOB 3.1 09/24/2017   AGRATIO 1.4 09/24/2017   BILITOT 0.4 01/23/2023   ALKPHOS 64 01/23/2023   AST 13 01/23/2023   ALT 11 01/23/2023   ANIONGAP 7 02/16/2020   Last lipids Lab Results  Component Value Date   CHOL 160 01/23/2023   HDL 57.10 01/23/2023   LDLCALC 89 01/23/2023   LDLDIRECT 128.0 01/03/2013   TRIG 66.0 01/23/2023   CHOLHDL 3 01/23/2023   Last hemoglobin A1c Lab Results  Component Value Date   HGBA1C 5.7 01/23/2023   Last thyroid functions Lab Results  Component Value Date   TSH 0.95 01/23/2023   T3TOTAL 114 09/24/2017   Last vitamin D Lab Results  Component Value Date   VD25OH 34.1 09/24/2017   Last vitamin B12 and Folate Lab Results  Component Value Date   VITAMINB12 914 02/16/2020   FOLATE 17.1 02/16/2020      The 10-year ASCVD risk score (Arnett DK, et al., 2019) is: 15.6%    Assessment & Plan:   Problem List Items Addressed This Visit       Unprioritized   Allergies   Acute non-recurrent pansinusitis - Primary   Relevant Medications   amoxicillin-clavulanate (AUGMENTIN) 875-125 MG tablet   levocetirizine (XYZAL) 5 MG tablet  Assessment and Plan    Sinusitis Presents with sinus headaches, pressure, sneezing, and sensation of fluid in the ear for two weeks. Symptoms initially improved with Flonase but have returned. No cough or fever. Likely sinusitis given symptom duration and nature. Start Augmentin. Continue Flonase. Switch from Zyrtec to Xyzal if Zyrtec is ineffective. Call if symptoms do not improve in 3-4 days.  General Health Maintenance Received flu shot. Discussed the importance of flu vaccination in preventing severe  illness. No additional interventions needed.  Follow-up Follow up if symptoms persist by Thursday.       No follow-ups on file.    Donato Schultz, DO

## 2023-06-17 ENCOUNTER — Other Ambulatory Visit (HOSPITAL_COMMUNITY): Payer: Self-pay

## 2023-07-03 ENCOUNTER — Ambulatory Visit: Payer: 59 | Admitting: Internal Medicine

## 2023-07-06 ENCOUNTER — Other Ambulatory Visit (HOSPITAL_BASED_OUTPATIENT_CLINIC_OR_DEPARTMENT_OTHER): Payer: Self-pay

## 2023-07-24 ENCOUNTER — Ambulatory Visit: Payer: 59 | Admitting: Family Medicine

## 2023-07-24 ENCOUNTER — Encounter: Payer: Self-pay | Admitting: Family Medicine

## 2023-07-24 VITALS — BP 120/70 | HR 67 | Temp 98.7°F | Resp 18 | Ht 65.0 in | Wt 250.0 lb

## 2023-07-24 DIAGNOSIS — Z23 Encounter for immunization: Secondary | ICD-10-CM | POA: Diagnosis not present

## 2023-07-24 DIAGNOSIS — E785 Hyperlipidemia, unspecified: Secondary | ICD-10-CM | POA: Diagnosis not present

## 2023-07-24 DIAGNOSIS — E1169 Type 2 diabetes mellitus with other specified complication: Secondary | ICD-10-CM | POA: Diagnosis not present

## 2023-07-24 DIAGNOSIS — Z7985 Long-term (current) use of injectable non-insulin antidiabetic drugs: Secondary | ICD-10-CM

## 2023-07-24 DIAGNOSIS — E559 Vitamin D deficiency, unspecified: Secondary | ICD-10-CM | POA: Diagnosis not present

## 2023-07-24 DIAGNOSIS — Z0000 Encounter for general adult medical examination without abnormal findings: Secondary | ICD-10-CM | POA: Diagnosis not present

## 2023-07-24 DIAGNOSIS — E119 Type 2 diabetes mellitus without complications: Secondary | ICD-10-CM

## 2023-07-24 DIAGNOSIS — I10 Essential (primary) hypertension: Secondary | ICD-10-CM

## 2023-07-24 LAB — COMPREHENSIVE METABOLIC PANEL WITH GFR
ALT: 22 U/L (ref 0–35)
AST: 19 U/L (ref 0–37)
Albumin: 4.4 g/dL (ref 3.5–5.2)
Alkaline Phosphatase: 61 U/L (ref 39–117)
BUN: 16 mg/dL (ref 6–23)
CO2: 32 meq/L (ref 19–32)
Calcium: 9.7 mg/dL (ref 8.4–10.5)
Chloride: 102 meq/L (ref 96–112)
Creatinine, Ser: 0.81 mg/dL (ref 0.40–1.20)
GFR: 76.92 mL/min (ref >60.00)
Glucose, Bld: 80 mg/dL (ref 70–99)
Potassium: 4.7 meq/L (ref 3.5–5.1)
Sodium: 142 meq/L (ref 135–145)
Total Bilirubin: 0.4 mg/dL (ref 0.2–1.2)
Total Protein: 7 g/dL (ref 6.0–8.3)

## 2023-07-24 LAB — CBC WITH DIFFERENTIAL/PLATELET
Basophils Absolute: 0 10*3/uL (ref 0.0–0.1)
Basophils Relative: 0.9 % (ref 0.0–3.0)
Eosinophils Absolute: 0.1 10*3/uL (ref 0.0–0.7)
Eosinophils Relative: 1.7 % (ref 0.0–5.0)
HCT: 40.4 % (ref 36.0–46.0)
Hemoglobin: 13.1 g/dL (ref 12.0–15.0)
Lymphocytes Relative: 43.2 % (ref 12.0–46.0)
Lymphs Abs: 2.1 10*3/uL (ref 0.7–4.0)
MCHC: 32.3 g/dL (ref 30.0–36.0)
MCV: 76.7 fl — ABNORMAL LOW (ref 78.0–100.0)
Monocytes Absolute: 0.4 10*3/uL (ref 0.1–1.0)
Monocytes Relative: 9 % (ref 3.0–12.0)
Neutro Abs: 2.2 10*3/uL (ref 1.4–7.7)
Neutrophils Relative %: 45.2 % (ref 43.0–77.0)
Platelets: 162 10*3/uL (ref 150.0–400.0)
RBC: 5.27 Mil/uL — ABNORMAL HIGH (ref 3.87–5.11)
RDW: 16 % — ABNORMAL HIGH (ref 11.5–15.5)
WBC: 4.8 10*3/uL (ref 4.0–10.5)

## 2023-07-24 LAB — HEMOGLOBIN A1C: Hgb A1c MFr Bld: 5.8 % (ref 4.6–6.5)

## 2023-07-24 LAB — LIPID PANEL
Cholesterol: 156 mg/dL (ref 0–200)
HDL: 57.2 mg/dL (ref >39.00)
LDL Cholesterol: 85 mg/dL (ref 0–99)
NonHDL: 99.1
Total CHOL/HDL Ratio: 3
Triglycerides: 72 mg/dL (ref 0.0–149.0)
VLDL: 14.4 mg/dL (ref 0.0–40.0)

## 2023-07-24 LAB — MICROALBUMIN / CREATININE URINE RATIO
Creatinine,U: 22.6 mg/dL
Microalb Creat Ratio: UNDETERMINED mg/g (ref 0.0–30.0)
Microalb, Ur: <0.7 mg/dL

## 2023-07-24 LAB — VITAMIN D 25 HYDROXY (VIT D DEFICIENCY, FRACTURES): VITD: 62.62 ng/mL (ref 30.00–100.00)

## 2023-07-24 MED ORDER — TIRZEPATIDE 10 MG/0.5ML ~~LOC~~ SOAJ: 10.0000 mg | SUBCUTANEOUS | 2 refills | Status: DC

## 2023-07-24 NOTE — Assessment & Plan Note (Signed)
 Ghm utd Check labs  See [facility] Health Maintenance  Topic Date Due   Diabetic kidney evaluation - Urine ACR  07/17/2023   OPHTHALMOLOGY EXAM  06/26/2023   HEMOGLOBIN A1C  07/24/2023   INFLUENZA VACCINE  11/20/2023   MAMMOGRAM  11/25/2023   Diabetic kidney evaluation - eGFR measurement  01/23/2024   FOOT EXAM  07/23/2024   Colonoscopy  03/02/2025   DTaP/Tdap/Td (3 - Td or Tdap) 12/20/2028   Pneumococcal Vaccine 47-64 Years old  Completed   COVID-19 Vaccine  Completed   Hepatitis C Screening  Completed   HIV Screening  Completed   Zoster Vaccines- Shingrix  Completed   HPV VACCINES  Aged Out

## 2023-07-24 NOTE — Progress Notes (Signed)
 Established Patient Office Visit  Subjective   Patient ID: Heather [name], female    DOB: 23-Sep-1960  Age: 63 y.o. MRN: [id number]  Chief Complaint  Patient presents with   Annual Exam    Pt states fasting     HPI Discussed the use of AI scribe software for clinical note transcription with the patient, who gave verbal consent to proceed.  History of Present Illness Heather [name] "Heather [name]" is a 63 year old female who presents for a routine follow-up visit.  She is currently taking Mounjaro 7.5 mg for diabetes management without experiencing any side effects. Her weight has increased from 245 lbs to 250 lbs despite exercising for more than 30 minutes twice a week. She is concerned about increasing the dose due to potential side effects. Her blood sugars are not being monitored at home.  She has a history of glaucoma and is scheduled for cataract surgery with Dr. Fredderick Phenix at [facility]. Her last regular eye exam was in March of the previous year, and she needs to schedule another one.  She is due for a pneumonia vaccine, which she receives every five years due to her diabetes. She has received her shingles vaccine, and her tetanus shot is not due until 2030.  She has been working at [employer] for 26 years and plans to retire next year. She exercises aerobically for at least 30 minutes twice a week. No issues with her stomach or joints, except for some knee discomfort.  She is planning a mission trip in June and a tour in [place] in November.   Patient Active Problem List   Diagnosis Date Noted   Anxiety 09/16/2022   Abscessed tooth 04/18/2022   Lower extremity edema 04/18/2022   Hyperlipidemia 04/18/2022   Suprapubic pain 04/18/2022   Class 3 severe obesity with serious comorbidity and body mass index (BMI) of 40.0 to 44.9 in adult, unspecified obesity type (HCC) 07/08/2021   Acute swimmer's ear of right side 12/03/2020   Alpha thalassemia (HCC) 07/02/2020   Head trauma  01/17/2020   Abnormal CBC 01/17/2020   Allergies 02/24/2018   Diet-controlled diabetes mellitus (HCC) 02/24/2018   Preventative health care 06/29/2016   CAD (coronary artery disease) 03/09/2014   Obesity (BMI 30-39.9) 01/07/2013   Acute non-recurrent pansinusitis 04/19/2009   GLAUCOMA 08/24/2008   HYPERGLYCEMIA, FASTING 08/24/2008   POSTMENOPAUSAL STATUS 08/24/2008   EDEMA 06/07/2007   Primary hypertension 08/05/2006   ALLERGIC RHINITIS 08/05/2006   Hyperlipidemia associated with type 2 diabetes mellitus (HCC)    Chest pain    Past Medical History:  Diagnosis Date   Acute bronchitis    Allergic rhinitis    Allergy    Anemia    Anxiety    Arthritis    knees   Back pain    Glaucoma    Hyperlipidemia    Hypertension    Joint pain    Leg edema    Neuromuscular disorder (HCC)    nerve issues in legs    Prediabetes    Past Surgical History:  Procedure Laterality Date   ABDOMINAL HYSTERECTOMY  1998   BREAST LUMPECTOMY     CESAREAN SECTION     COLONOSCOPY     MYOMECTOMY     POLYPECTOMY     Social History   Tobacco Use   Smoking status: Never   Smokeless tobacco: Never  Vaping Use   Vaping status: Never Used  Substance Use Topics   Alcohol use: Yes  Alcohol/week: 0.0 standard drinks of alcohol    Comment: occ   Drug use: No   Social History   Socioeconomic History   Marital status: Single    Spouse name: Not on file   Number of children: Not on file   Years of education: 20   Highest education level: Master's degree (e.g., MA, MS, MEng, MEd, MSW, MBA)  Occupational History   Occupation: IT consultant lab  Tobacco Use   Smoking status: Never   Smokeless tobacco: Never  Vaping Use   Vaping status: Never Used  Substance and Sexual Activity   Alcohol use: Yes    Alcohol/week: 0.0 standard drinks of alcohol    Comment: occ   Drug use: No   Sexual activity: Yes    Partners: Male  Other Topics Concern   Not on file  Social  History Narrative   Exercise-- every night min30+min burning exercise --  min 2x a week    Social Drivers of Corporate investment banker Strain: Low Risk  (01/19/2023)   Overall Financial Resource Strain (CARDIA)    Difficulty of Paying Living Expenses: Not hard at all  Food Insecurity: No Food Insecurity (01/19/2023)   Hunger Vital Sign    Worried About Running Out of Food in the Last Year: Never true    Ran Out of Food in the Last Year: Never true  Transportation Needs: No Transportation Needs (01/19/2023)   PRAPARE - Administrator, Civil Service (Medical): No    Lack of Transportation (Non-Medical): No  Physical Activity: Insufficiently Active (01/19/2023)   Exercise Vital Sign    Days of Exercise per Week: 5 days    Minutes of Exercise per Session: 10 min  Stress: No Stress Concern Present (01/19/2023)   [employer] of Occupational Health - Occupational Stress Questionnaire    Feeling of Stress : Only a little  Social Connections: Moderately Integrated (01/19/2023)   Social Connection and Isolation Panel [NHANES]    Frequency of Communication with Friends and Family: Twice a week    Frequency of Social Gatherings with Friends and Family: Once a week    Attends Religious Services: 1 to 4 times per year    Active Member of [employer] or Organizations: Yes    Attends Engineer, structural: More than 4 times per year    Marital Status: Divorced  Catering manager Violence: Not on file   Family Status  Relation Name Status   Father  Deceased   Brother  Alive   Brother  Alive   Brother  Alive   Brother stephen Deceased at age 42   Cousin  Alive   Mother  Alive   Other  (Not Specified)   Other  (Not Specified)   Youth worker  (Not Specified)   Mat Uncle  (Not Specified)   MGM  (Not Specified)   MGF  (Not Specified)   Neg Hx  (Not Specified)  No partnership data on file   Family History  Problem Relation Age of Onset   Dementia Father    Stroke Father     Atrial fibrillation Father    Alzheimer's disease Father    Hyperlipidemia Father    Hypertension Father    Heart disease Father    Prostate cancer Father 51   Hyperlipidemia Brother    Hypertension Brother    Colon polyps Brother    Hyperlipidemia Brother    Hypertension Brother    Hypertension Brother  Pulmonary embolism Brother    Cancer - Colon Brother 32       colon CA--stage 4   Colon cancer Brother    Colon cancer Cousin 68   Hypertension Mother    Hyperlipidemia Mother    Heart disease Mother    Endometrial cancer Other    Atrial fibrillation Other    Diabetes Maternal Aunt    Diabetes Maternal Uncle    Kidney disease Maternal Uncle        dialysis   Uterine cancer Maternal Grandmother    Cancer Maternal Grandfather        pancreatic   Pancreatic cancer Maternal Grandfather    Esophageal cancer Neg Hx    Rectal cancer Neg Hx    Stomach cancer Neg Hx    Allergies  Allergen Reactions   Oseltamivir Phosphate Other (See Comments)    Couldn't walk. Blood shot eyes      Review of Systems  Constitutional:  Negative for fever and malaise/fatigue.  HENT:  Negative for congestion.   Eyes:  Negative for blurred vision.  Respiratory:  Negative for cough and shortness of breath.   Cardiovascular:  Negative for chest pain, palpitations and leg swelling.  Gastrointestinal:  Negative for vomiting.  Musculoskeletal:  Negative for back pain.  Skin:  Negative for rash.  Neurological:  Negative for loss of consciousness and headaches.      Objective:     BP 120/70 (BP Location: Left Arm, Patient Position: Sitting, Cuff Size: Large)   Pulse 67   Temp 98.7 F (37.1 C) (Oral)   Resp 18   Ht 5\' 5"  (1.651 m)   Wt 250 lb (113.4 kg)   SpO2 98%   BMI 41.60 kg/m  BP Readings from Last 3 Encounters:  07/24/23 120/70  06/15/23 (!) 130/8  01/23/23 120/80   Wt Readings from Last 3 Encounters:  07/24/23 250 lb (113.4 kg)  06/15/23 245 lb 3.2 oz (111.2 kg)  01/23/23  251 lb (113.9 kg)   SpO2 Readings from Last 3 Encounters:  07/24/23 98%  06/15/23 98%  01/23/23 98%      Physical Exam Vitals and nursing note reviewed.  Constitutional:      General: She is not in acute distress.    Appearance: Normal appearance. She is well-developed.  HENT:     Head: Normocephalic and atraumatic.     Right Ear: Tympanic membrane, ear canal and external ear normal. There is no impacted cerumen.     Left Ear: Tympanic membrane, ear canal and external ear normal. There is no impacted cerumen.     Nose: Nose normal.     Mouth/Throat:     Mouth: Mucous membranes are moist.     Pharynx: Oropharynx is clear. No oropharyngeal exudate or posterior oropharyngeal erythema.  Eyes:     General: No scleral icterus.       Right eye: No discharge.        Left eye: No discharge.     Conjunctiva/sclera: Conjunctivae normal.     Pupils: Pupils are equal, round, and reactive to light.  Neck:     Thyroid: No thyromegaly or thyroid tenderness.     Vascular: No JVD.  Cardiovascular:     Rate and Rhythm: Normal rate and regular rhythm.     Heart sounds: Normal heart sounds. No murmur heard. Pulmonary:     Effort: Pulmonary effort is normal. No respiratory distress.     Breath sounds: Normal breath sounds.  Abdominal:  General: Bowel sounds are normal. There is no distension.     Palpations: Abdomen is soft. There is no mass.     Tenderness: There is no abdominal tenderness. There is no guarding or rebound.  Musculoskeletal:        General: Normal range of motion.     Cervical back: Normal range of motion and neck supple.     Right lower leg: No edema.     Left lower leg: No edema.  Lymphadenopathy:     Cervical: No cervical adenopathy.  Skin:    General: Skin is warm and dry.     Findings: No erythema or rash.  Neurological:     Mental Status: She is alert and oriented to person, place, and time.     Cranial Nerves: No cranial nerve deficit.     Deep Tendon  Reflexes: Reflexes are normal and symmetric.  Psychiatric:        Mood and Affect: Mood normal.        Behavior: Behavior normal.        Thought Content: Thought content normal.        Judgment: Judgment normal.      No results found for any visits on 07/24/23.  Last CBC Lab Results  Component Value Date   WBC 5.1 01/23/2023   HGB 12.8 01/23/2023   HCT 40.5 01/23/2023   MCV 77.2 (L) 01/23/2023   MCH 24.9 (L) 04/18/2022   RDW 16.0 (H) 01/23/2023   PLT 189.0 01/23/2023   Last metabolic panel Lab Results  Component Value Date   GLUCOSE 78 01/23/2023   NA 141 01/23/2023   K 4.6 01/23/2023   CL 101 01/23/2023   CO2 33 (H) 01/23/2023   BUN 13 01/23/2023   CREATININE 0.80 01/23/2023   GFR 78.35 01/23/2023   CALCIUM 9.8 01/23/2023   PROT 6.9 01/23/2023   ALBUMIN 4.0 01/23/2023   LABGLOB 3.1 09/24/2017   AGRATIO 1.4 09/24/2017   BILITOT 0.4 01/23/2023   ALKPHOS 64 01/23/2023   AST 13 01/23/2023   ALT 11 01/23/2023   ANIONGAP 7 02/16/2020   Last lipids Lab Results  Component Value Date   CHOL 160 01/23/2023   HDL 57.10 01/23/2023   LDLCALC 89 01/23/2023   LDLDIRECT 128.0 01/03/2013   TRIG 66.0 01/23/2023   CHOLHDL 3 01/23/2023   Last hemoglobin A1c Lab Results  Component Value Date   HGBA1C 5.7 01/23/2023   Last thyroid functions Lab Results  Component Value Date   TSH 0.95 01/23/2023   T3TOTAL 114 09/24/2017   Last vitamin D Lab Results  Component Value Date   VD25OH 34.1 09/24/2017   Last vitamin B12 and Folate Lab Results  Component Value Date   VITAMINB12 914 02/16/2020   FOLATE 17.1 02/16/2020      The 10-year ASCVD risk score (Arnett DK, et al., 2019) is: 13.7%    Assessment & Plan:   Problem List Items Addressed This Visit       Unprioritized   Primary hypertension   Well controlled, no changes to meds. Encouraged heart healthy diet such as the DASH diet and exercise as tolerated.        Relevant Orders   Lipid panel   CBC  with Differential/Platelet   Comprehensive metabolic panel with GFR   Preventative health care - Primary   Ghm utd Check labs  See AVS Health Maintenance  Topic Date Due   Diabetic kidney evaluation - Urine ACR  07/17/2023  OPHTHALMOLOGY EXAM  06/26/2023   HEMOGLOBIN A1C  07/24/2023   INFLUENZA VACCINE  11/20/2023   MAMMOGRAM  11/25/2023   Diabetic kidney evaluation - eGFR measurement  01/23/2024   FOOT EXAM  07/23/2024   Colonoscopy  03/02/2025   DTaP/Tdap/Td (3 - Td or Tdap) 12/20/2028   Pneumococcal Vaccine 49-43 Years old  Completed   COVID-19 Vaccine  Completed   Hepatitis C Screening  Completed   HIV Screening  Completed   Zoster Vaccines- Shingrix  Completed   HPV VACCINES  Aged Out         Hyperlipidemia associated with type 2 diabetes mellitus (HCC)   Encourage heart healthy diet such as MIND or DASH diet, increase exercise, avoid trans fats, simple carbohydrates and processed foods, consider a krill or fish or flaxseed oil cap daily.        Relevant Medications   tirzepatide (MOUNJARO) 10 MG/0.5ML Pen   Other Relevant Orders   Lipid panel   Comprehensive metabolic panel with GFR   Other Visit Diagnoses       Diabetes mellitus type II, non insulin dependent (HCC)       Relevant Medications   tirzepatide (MOUNJARO) 10 MG/0.5ML Pen   Other Relevant Orders   Comprehensive metabolic panel with GFR   Hemoglobin A1c   Microalbumin / creatinine urine ratio     Morbid obesity (HCC)       Relevant Medications   tirzepatide (MOUNJARO) 10 MG/0.5ML Pen     Vitamin D deficiency       Relevant Orders   VITAMIN D 25 Hydroxy (Vit-D Deficiency, Fractures)     Need for pneumococcal 20-valent conjugate vaccination       Relevant Orders   Pneumococcal conjugate vaccine 20-valent (Prevnar 20) (Completed)     Assessment and Plan Assessment & Plan Type 2 Diabetes Mellitus   Currently managed with Mounjaro 7.5 mg. Her weight increased from 245 lbs to 250 lbs despite  increased exercise. She is hesitant to increase the dose due to potential side effects, though none are currently experienced. Suggested trying a higher dose with the option to revert if side effects occur. Continue Mounjaro 7.5 mg. Consider increasing the dose with the option to revert if side effects occur. Monitor weight and blood glucose levels.  Glaucoma   Under the care of ophthalmologist Dr. Fredderick Phenix. She is scheduled for cataract surgery and has an eye appointment on June 27th. Continue follow-up with Dr. Fredderick Phenix and proceed with scheduled cataract surgery.  General Health Maintenance   She is due for a pneumonia vaccine. Exercises aerobically for at least 30 minutes twice a week. Bone density was last checked in August of the previous year, and a mammogram is scheduled with [facility]. Administer pneumonia vaccine. Continue aerobic exercise at least twice a week. Schedule a bone density test next year.  Follow-up   Plans to schedule an annual physical and has a follow-up appointment with a heart specialist in June. She is planning a mission trip in June and a trip to [place] in November. Schedule annual physical. Attend follow-up appointment with heart specialist in June. Plan for mission trip in June and trip to [place] in November.    Return in about 6 months (around 01/23/2024), or if symptoms worsen or fail to improve, for hypertension, hyperlipidemia, diabetes II.    Donato Schultz, DO

## 2023-07-24 NOTE — Patient Instructions (Signed)
 Preventive Care 16-64 Years Old, Female  Preventive care refers to lifestyle choices and visits with your health care provider that can promote health and wellness. Preventive care visits are also called wellness exams.  What can I expect for my preventive care visit?  Counseling  Your health care provider may ask you questions about your:  Medical history, including:  Past medical problems.  Family medical history.  Pregnancy history.  Current health, including:  Menstrual cycle.  Method of birth control.  Emotional well-being.  Home life and relationship well-being.  Sexual activity and sexual health.  Lifestyle, including:  Alcohol, nicotine or tobacco, and drug use.  Access to firearms.  Diet, exercise, and sleep habits.  Work and work Astronomer.  Sunscreen use.  Safety issues such as seatbelt and bike helmet use.  Physical exam  Your health care provider will check your:  Height and weight. These may be used to calculate your BMI (body mass index). BMI is a measurement that tells if you are at a healthy weight.  Waist circumference. This measures the distance around your waistline. This measurement also tells if you are at a healthy weight and may help predict your risk of certain diseases, such as type 2 diabetes and high blood pressure.  Heart rate and blood pressure.  Body temperature.  Skin for abnormal spots.  What immunizations do I need?    Vaccines are usually given at various ages, according to a schedule. Your health care provider will recommend vaccines for you based on your age, medical history, and lifestyle or other factors, such as travel or where you work.  What tests do I need?  Screening  Your health care provider may recommend screening tests for certain conditions. This may include:  Lipid and cholesterol levels.  Diabetes screening. This is done by checking your blood sugar (glucose) after you have not eaten for a while (fasting).  Pelvic exam and Pap test.  Hepatitis B test.  Hepatitis C  test.  HIV (human immunodeficiency virus) test.  STI (sexually transmitted infection) testing, if you are at risk.  Lung cancer screening.  Colorectal cancer screening.  Mammogram. Talk with your health care provider about when you should start having regular mammograms. This may depend on whether you have a family history of breast cancer.  BRCA-related cancer screening. This may be done if you have a family history of breast, ovarian, tubal, or peritoneal cancers.  Bone density scan. This is done to screen for osteoporosis.  Talk with your health care provider about your test results, treatment options, and if necessary, the need for more tests.  Follow these instructions at home:  Eating and drinking    Eat a diet that includes fresh fruits and vegetables, whole grains, lean protein, and low-fat dairy products.  Take vitamin and mineral supplements as recommended by your health care provider.  Do not drink alcohol if:  Your health care provider tells you not to drink.  You are pregnant, may be pregnant, or are planning to become pregnant.  If you drink alcohol:  Limit how much you have to 0-1 drink a day.  Know how much alcohol is in your drink. In the [place] one drink equals one 12 oz bottle of beer (355 mL), one 5 oz glass of wine (148 mL), or one 1 oz glass of hard liquor (44 mL).  Lifestyle  Brush your teeth every morning and night with fluoride toothpaste. Floss one time each day.  Exercise for at least  30 minutes 5 or more days each week.  Do not use any products that contain nicotine or tobacco. These products include cigarettes, chewing tobacco, and vaping devices, such as e-cigarettes. If you need help quitting, ask your health care provider.  Do not use drugs.  If you are sexually active, practice safe sex. Use a condom or other form of protection to prevent STIs.  If you do not wish to become pregnant, use a form of birth control. If you plan to become pregnant, see your health care provider for a  prepregnancy visit.  Take aspirin only as told by your health care provider. Make sure that you understand how much to take and what form to take. Work with your health care provider to find out whether it is safe and beneficial for you to take aspirin daily.  Find healthy ways to manage stress, such as:  Meditation, yoga, or listening to music.  Journaling.  Talking to a trusted person.  Spending time with friends and family.  Minimize exposure to UV radiation to reduce your risk of skin cancer.  Safety  Always wear your seat belt while driving or riding in a vehicle.  Do not drive:  If you have been drinking alcohol. Do not ride with someone who has been drinking.  When you are tired or distracted.  While texting.  If you have been using any mind-altering substances or drugs.  Wear a helmet and other protective equipment during sports activities.  If you have firearms in your house, make sure you follow all gun safety procedures.  Seek help if you have been physically or sexually abused.  What's next?  Visit your health care provider once a year for an annual wellness visit.  Ask your health care provider how often you should have your eyes and teeth checked.  Stay up to date on all vaccines.  This information is not intended to replace advice given to you by your health care provider. Make sure you discuss any questions you have with your health care provider.  Document Revised: 10/03/2020 Document Reviewed: 10/03/2020  Elsevier Patient Education  2024 [employer].

## 2023-07-24 NOTE — Assessment & Plan Note (Signed)
 Well controlled, no changes to meds. Encouraged heart healthy diet such as the DASH diet and exercise as tolerated.

## 2023-07-24 NOTE — Assessment & Plan Note (Signed)
 Encourage heart healthy diet such as MIND or DASH diet, increase exercise, avoid trans fats, simple carbohydrates and processed foods, consider a krill or fish or flaxseed oil cap daily.

## 2023-07-25 ENCOUNTER — Encounter: Payer: Self-pay | Admitting: Family Medicine

## 2023-07-28 ENCOUNTER — Other Ambulatory Visit: Payer: Self-pay | Admitting: Family Medicine

## 2023-07-28 DIAGNOSIS — I10 Essential (primary) hypertension: Secondary | ICD-10-CM

## 2023-08-31 ENCOUNTER — Other Ambulatory Visit: Payer: Self-pay | Admitting: Family Medicine

## 2023-09-14 ENCOUNTER — Encounter: Payer: Self-pay | Admitting: Internal Medicine

## 2023-10-16 ENCOUNTER — Ambulatory Visit: Admitting: Internal Medicine

## 2023-10-20 HISTORY — PX: CATARACT EXTRACTION, BILATERAL: SHX1313

## 2023-10-28 ENCOUNTER — Other Ambulatory Visit: Payer: Self-pay | Admitting: Family Medicine

## 2023-10-28 DIAGNOSIS — R600 Localized edema: Secondary | ICD-10-CM

## 2023-12-01 LAB — HM MAMMOGRAPHY

## 2023-12-03 ENCOUNTER — Encounter: Payer: Self-pay | Admitting: Family Medicine

## 2023-12-28 ENCOUNTER — Telehealth: Payer: Self-pay

## 2023-12-28 NOTE — Telephone Encounter (Signed)
 Copied from CRM [id number]. Topic: Clinical - Medication Question >> Dec 28, 2023  8:38 AM Gustabo D wrote: Pt wants to know when the covid vaccine will arrive

## 2023-12-31 ENCOUNTER — Ambulatory Visit: Attending: Internal Medicine | Admitting: Internal Medicine

## 2023-12-31 ENCOUNTER — Encounter: Payer: Self-pay | Admitting: Internal Medicine

## 2023-12-31 VITALS — BP 120/76 | HR 66 | Ht 65.0 in | Wt 250.8 lb

## 2023-12-31 DIAGNOSIS — E1169 Type 2 diabetes mellitus with other specified complication: Secondary | ICD-10-CM | POA: Diagnosis not present

## 2023-12-31 DIAGNOSIS — R079 Chest pain, unspecified: Secondary | ICD-10-CM | POA: Diagnosis not present

## 2023-12-31 DIAGNOSIS — I10 Essential (primary) hypertension: Secondary | ICD-10-CM | POA: Diagnosis not present

## 2023-12-31 DIAGNOSIS — E7841 Elevated Lipoprotein(a): Secondary | ICD-10-CM

## 2023-12-31 DIAGNOSIS — E785 Hyperlipidemia, unspecified: Secondary | ICD-10-CM

## 2023-12-31 NOTE — Patient Instructions (Signed)
 Medication Instructions:  Your physician recommends that you continue on your current medications as directed. Please refer to the Current Medication list given to you today.  *If you need a refill on your cardiac medications before your next appointment, please call your pharmacy*  Lab Work: NMR, Hgb A1-C and Lpa today  If you have labs (blood work) drawn today and your tests are completely normal, you will receive your results only by: MyChart Message (if you have MyChart) OR A paper copy in the mail If you have any lab test that is abnormal or we need to change your treatment, we will call you to review the results.  Testing/Procedures: None ordered.   Follow-Up: At [facility], you and your health needs are our priority.  As part of our continuing mission to provide you with exceptional heart care, our providers are all part of one team.  This team includes your primary Cardiologist (physician) and Advanced Practice Providers or APPs (Physician Assistants and Nurse Practitioners) who all work together to provide you with the care you need, when you need it.  Your next appointment:   To be determined

## 2023-12-31 NOTE — Progress Notes (Signed)
 OFFICE CONSULT NOTE  Chief Complaint:  Follow-up  Primary Care Physician: Antonio Cyndee Jamee JONELLE, DO  HPI:  Heather [name] is a 63 y.o. female who is being seen today for the evaluation of chest pain at the request of Antonio Cyndee Jamee JONELLE, *.  This is a pleasant 63 year old female kindly referred for evaluation of chest pain.  She had an episode several months ago but reported she was under a lot of stress at the time.  She was taking care of her elderly mother who has unfortunately since passed.  She was seen by her PCP and noted to have an elevated D-dimer and subsequently underwent a CT angio of the chest at [facility] on Sep 18, 2022.  This demonstrated no pulmonary embolus however mild cardiomegaly and small bilateral pleural effusions.  No mention of coronary artery calcifications.  Since then she really has not had a lot of chest pain.  The pain was described as left-sided and radiating somewhat down her arm and to her back and up to her neck.  Not necessarily worse with exertion or relieved by rest.  She does have a lot of cardiovascular risk factors though including hypertension which is well-managed and dyslipidemia.  She has a strong family history of heart disease as well.  She also had a recent echo which showed normal LVEF of 60 to 65%, moderate left atrial enlargement and mild mitral regurgitation.  She was not aware of the mitral regurgitation and left atrial enlargement which we went over today.  She does have a history of A-fib in the family but does not endorse any palpitations.  EKG personally reviewed shows normal sinus rhythm.  12/31/2023  Heather [name] returns today for follow-up.  She denies any recurrent chest pain.  She had very minimal coronary artery calcification on her CT scan.  No obstructive coronary disease.  Her lipids have gone up somewhat since I last saw her.  Total cholesterol 156, HDL 57, triglycerides 72 and LDL 85.  She has been working on  improving her diet and has had weight loss on Mounjaro .  Recent A1c was 5.8%.  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  PMHx:  Past Medical History:  Diagnosis Date   Acute bronchitis    Allergic rhinitis    Allergy    Anemia    Anxiety    Arthritis    knees   Back pain    Glaucoma    Hyperlipidemia    Hypertension    Joint pain    Leg edema    Neuromuscular disorder (HCC)    nerve issues in legs    Prediabetes     Past Surgical History:  Procedure Laterality Date   ABDOMINAL HYSTERECTOMY  1998   BREAST LUMPECTOMY     CESAREAN SECTION     COLONOSCOPY     MYOMECTOMY     POLYPECTOMY      FAMHx:  Family History  Problem Relation Age of Onset   Dementia Father    Stroke Father    Atrial fibrillation Father    Alzheimer's disease Father    Hyperlipidemia Father    Hypertension Father    Heart disease Father    Prostate cancer Father 66   Hyperlipidemia Brother    Hypertension Brother    Colon polyps Brother    Hyperlipidemia Brother    Hypertension Brother    Hypertension Brother    Pulmonary embolism Brother  Cancer - Colon Brother 47       colon CA--stage 4   Colon cancer Brother    Colon cancer Cousin 11   Hypertension Mother    Hyperlipidemia Mother    Heart disease Mother    Endometrial cancer Other    Atrial fibrillation Other    Diabetes Maternal Aunt    Diabetes Maternal Uncle    Kidney disease Maternal Uncle        dialysis   Uterine cancer Maternal Grandmother    Cancer Maternal Grandfather        pancreatic   Pancreatic cancer Maternal Grandfather    Esophageal cancer Neg Hx    Rectal cancer Neg Hx    Stomach cancer Neg Hx     SOCHx:   reports that she has never smoked. She has never used smokeless tobacco. She reports current alcohol use. She reports that she does not use drugs.  ALLERGIES:  Allergies  Allergen Reactions   Oseltamivir Phosphate Other (See Comments)    Couldn't walk. Blood shot eyes    ROS: Pertinent  items noted in HPI and remainder of comprehensive ROS otherwise negative.  HOME MEDS: Current Outpatient Medications on File Prior to Visit  Medication Sig Dispense Refill   ALPRAZolam  (XANAX ) 0.25 MG tablet Take 1 tablet (0.25 mg total) by mouth 2 (two) times daily as needed for anxiety. 20 tablet 0   cholecalciferol (VITAMIN D3) 25 MCG (1000 UNIT) tablet Take 1,000 Units by mouth daily.     cyclobenzaprine  (FLEXERIL ) 10 MG tablet Take 1 tablet by mouth three times daily as needed for muscle spasm 30 tablet 0   diclofenac  sodium (VOLTAREN ) 1 % GEL APPLY 4 GRAMS  TOPICALLY 4 TIMES DAILY 100 g 0   fluticasone  (FLONASE ) 50 MCG/ACT nasal spray Place 2 sprays into both nostrils daily. 48 g 1   folic acid  (FOLVITE ) 1 MG tablet Take 1 tablet (1 mg total) by mouth daily. 90 tablet 3   furosemide  (LASIX ) 20 MG tablet Take 1 tablet by mouth once daily 90 tablet 0   Lactobacillus (PROBIOTIC ACIDOPHILUS PO) Take by mouth.     latanoprost (XALATAN) 0.005 % ophthalmic solution      levocetirizine (XYZAL ) 5 MG tablet Take 1 tablet (5 mg total) by mouth every evening. 30 tablet 5   lisinopril  (ZESTRIL ) 10 MG tablet Take 1 tablet (10 mg total) by mouth daily. 90 tablet 1   metoprolol  tartrate (LOPRESSOR ) 50 MG tablet Take 1 tablet (50 mg total) by mouth as directed. Take TWO HOURS before CT test 1 tablet 0   Multiple Vitamin (MULTIVITAMIN) tablet Take 1 tablet by mouth daily.     Naproxen Sod-diphenhydrAMINE (ALEVE PM PO) Take 1 tablet by mouth at bedtime as needed.     rosuvastatin  (CRESTOR ) 10 MG tablet Take 1 tablet by mouth once daily 90 tablet 1   tirzepatide  (MOUNJARO ) 10 MG/0.5ML Pen Inject 10 mg into the skin once a week. 6 mL 2   TURMERIC PO Take by mouth.     zinc gluconate 50 MG tablet Take 50 mg by mouth daily.     No current facility-administered medications on file prior to visit.    LABS/IMAGING: No results found for this or any previous visit (from the past 48 hours). No results  found.  LIPID PANEL:    Component Value Date/Time   CHOL 156 07/24/2023 0921   CHOL 215 (H) 09/24/2017 0921   TRIG 72.0 07/24/2023 0921   HDL 57.20  07/24/2023 0921   HDL 72 09/24/2017 0921   CHOLHDL 3 07/24/2023 0921   VLDL 14.4 07/24/2023 0921   LDLCALC 85 07/24/2023 0921   LDLCALC 118 (H) 04/18/2022 1619   LDLDIRECT 128.0 01/03/2013 0829    WEIGHTS: Wt Readings from Last 3 Encounters:  12/31/23 250 lb 12.8 oz (113.8 kg)  07/24/23 250 lb (113.4 kg)  06/15/23 245 lb 3.2 oz (111.2 kg)    VITALS: BP 120/76 (BP Location: Left Arm, Patient Position: Sitting)   Pulse 66   Ht 5' 5 (1.651 m)   Wt 250 lb 12.8 oz (113.8 kg)   SpO2 98%   BMI 41.74 kg/m   EXAM: General appearance: alert, no distress, and morbidly obese Lungs: clear to auscultation bilaterally Heart: regular rate and rhythm Extremities: extremities normal, atraumatic, no cyanosis or edema Neurologic: Grossly normal  EKG: EKG Interpretation Date/Time:  Thursday December 31 2023 08:02:50 EDT Ventricular Rate:  66 PR Interval:  160 QRS Duration:  78 QT Interval:  412 QTC Calculation: 431 R Axis:   -9  Text Interpretation: Normal sinus rhythm Normal ECG When compared with ECG of 06-Nov-2022 10:55, No significant change was found Confirmed by [name] Heather [phone]) on 12/31/2023 8:11:50 AM    ASSESSMENT: Unspecified chest pain - CAC score of 2, minimal non-obstructive CAD (10/2022) 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   Heather [name] has had no recurrent chest pain.  She had a calcium  score of 2 with minimal nonobstructive disease last year.  Her LP(a) is elevated.  Her target LDL is less than 70 but recently she has been over that.  She feels it may be a little bit better now therefore we will plan to repeat her lipids including LPA and A1c since she has been on the Mounjaro  and had  some recent weight loss.  Blood pressure is well-controlled today 120/76.  I will reach out for further medication adjustments if necessary.  She can likely follow-up on an as needed basis.  I will hold onto her name for possible clinical research trial.  Although her LP(a) has been elevated to a level that may qualify for that due to her minimal coronary artery disease she may not qualify.  Heather KYM Mona, MD, [facility], FNLA, FACP    [facility]  Medical Director of the [facility]  [facility] Diplomate of the American Board of Clinical Lipidology Attending Cardiologist  Direct Dial: [phone]  Fax: [phone]  Website:  [email or web address] Heather BROCKS Azilee Pirro 12/31/2023, 8:11 AM

## 2024-01-01 LAB — NMR, LIPOPROFILE
Cholesterol, Total: 148 mg/dL (ref 100–199)
HDL Particle Number: 36.9 umol/L (ref >=30.5)
HDL-C: 65 mg/dL (ref >39)
LDL Particle Number: 847 nmol/L (ref <1000)
LDL Size: 20.5 nm — AB (ref >20.5)
LDL-C (NIH Calc): 71 mg/dL (ref 0–99)
LP-IR Score: 30 (ref <=45)
Small LDL Particle Number: 421 nmol/L (ref <=527)
Triglycerides: 58 mg/dL (ref 0–149)

## 2024-01-01 LAB — LIPOPROTEIN A (LPA): Lipoprotein (a): 170.2 nmol/L — AB (ref <75.0)

## 2024-01-01 LAB — HEMOGLOBIN A1C
Est. average glucose Bld gHb Est-mCnc: 111 mg/dL
Hgb A1c MFr Bld: 5.5 % (ref 4.8–5.6)

## 2024-01-09 ENCOUNTER — Ambulatory Visit: Payer: Self-pay | Admitting: Internal Medicine

## 2024-01-19 ENCOUNTER — Encounter: Payer: Self-pay | Admitting: Family Medicine

## 2024-01-22 ENCOUNTER — Encounter: Payer: Self-pay | Admitting: Family Medicine

## 2024-01-22 ENCOUNTER — Other Ambulatory Visit: Payer: Self-pay | Admitting: Family Medicine

## 2024-01-22 ENCOUNTER — Ambulatory Visit: Payer: Self-pay | Admitting: Family Medicine

## 2024-01-22 ENCOUNTER — Ambulatory Visit (HOSPITAL_BASED_OUTPATIENT_CLINIC_OR_DEPARTMENT_OTHER)
Admission: RE | Admit: 2024-01-22 | Discharge: 2024-01-22 | Disposition: A | Discharge status: Home / Self Care | Source: Ambulatory Visit | Attending: Family Medicine | Admitting: Family Medicine

## 2024-01-22 ENCOUNTER — Ambulatory Visit: Admitting: Family Medicine

## 2024-01-22 VITALS — BP 100/70 | HR 68 | Temp 98.0°F | Resp 18 | Ht 65.0 in | Wt 249.4 lb

## 2024-01-22 DIAGNOSIS — I10 Essential (primary) hypertension: Secondary | ICD-10-CM | POA: Diagnosis not present

## 2024-01-22 DIAGNOSIS — R600 Localized edema: Secondary | ICD-10-CM

## 2024-01-22 DIAGNOSIS — S0990XA Unspecified injury of head, initial encounter: Secondary | ICD-10-CM | POA: Diagnosis present

## 2024-01-22 DIAGNOSIS — E785 Hyperlipidemia, unspecified: Secondary | ICD-10-CM

## 2024-01-22 DIAGNOSIS — E119 Type 2 diabetes mellitus without complications: Secondary | ICD-10-CM | POA: Diagnosis not present

## 2024-01-22 DIAGNOSIS — E1169 Type 2 diabetes mellitus with other specified complication: Secondary | ICD-10-CM

## 2024-01-22 LAB — CBC WITH DIFFERENTIAL/PLATELET
Basophils Absolute: 0 K/uL (ref 0.0–0.1)
Basophils Relative: 1 % (ref 0.0–3.0)
Eosinophils Absolute: 0 K/uL (ref 0.0–0.7)
Eosinophils Relative: 1.2 % (ref 0.0–5.0)
HCT: 40.8 % (ref 36.0–46.0)
Hemoglobin: 13.1 g/dL (ref 12.0–15.0)
Lymphocytes Relative: 42.8 % (ref 12.0–46.0)
Lymphs Abs: 1.7 K/uL (ref 0.7–4.0)
MCHC: 32.1 g/dL (ref 30.0–36.0)
MCV: 76.3 fl — ABNORMAL LOW (ref 78.0–100.0)
Monocytes Absolute: 0.3 K/uL (ref 0.1–1.0)
Monocytes Relative: 7.3 % (ref 3.0–12.0)
Neutro Abs: 1.8 K/uL (ref 1.4–7.7)
Neutrophils Relative %: 47.7 % (ref 43.0–77.0)
Platelets: 163 K/uL (ref 150.0–400.0)
RBC: 5.35 Mil/uL — ABNORMAL HIGH (ref 3.87–5.11)
RDW: 15.6 % — ABNORMAL HIGH (ref 11.5–15.5)
WBC: 3.9 K/uL — ABNORMAL LOW (ref 4.0–10.5)

## 2024-01-22 LAB — COMPREHENSIVE METABOLIC PANEL WITH GFR
ALT: 14 U/L (ref 0–35)
AST: 15 U/L (ref 0–37)
Albumin: 4.3 g/dL (ref 3.5–5.2)
Alkaline Phosphatase: 54 U/L (ref 39–117)
BUN: 16 mg/dL (ref 6–23)
CO2: 30 meq/L (ref 19–32)
Calcium: 9.5 mg/dL (ref 8.4–10.5)
Chloride: 103 meq/L (ref 96–112)
Creatinine, Ser: 0.76 mg/dL (ref 0.40–1.20)
GFR: 82.74 mL/min (ref >60.00)
Glucose, Bld: 81 mg/dL (ref 70–99)
Potassium: 3.9 meq/L (ref 3.5–5.1)
Sodium: 140 meq/L (ref 135–145)
Total Bilirubin: 0.3 mg/dL (ref 0.2–1.2)
Total Protein: 7.2 g/dL (ref 6.0–8.3)

## 2024-01-22 LAB — LIPID PANEL
Cholesterol: 153 mg/dL (ref 0–200)
HDL: 58.5 mg/dL (ref >39.00)
LDL Cholesterol: 84 mg/dL (ref 0–99)
NonHDL: 94.72
Total CHOL/HDL Ratio: 3
Triglycerides: 55 mg/dL (ref 0.0–149.0)
VLDL: 11 mg/dL (ref 0.0–40.0)

## 2024-01-22 LAB — HEMOGLOBIN A1C: Hgb A1c MFr Bld: 5.9 % (ref 4.6–6.5)

## 2024-01-22 NOTE — Assessment & Plan Note (Signed)
 Well controlled, no changes to meds. Encouraged heart healthy diet such as the DASH diet and exercise as tolerated.

## 2024-01-22 NOTE — Assessment & Plan Note (Signed)
 Encourage heart healthy diet such as MIND or DASH diet, increase exercise, avoid trans fats, simple carbohydrates and processed foods, consider a krill or fish or flaxseed oil cap daily.

## 2024-01-22 NOTE — Progress Notes (Signed)
 Subjective:    Patient ID: Heather [name], female    DOB: 1961-03-02, 63 y.o.   MRN: [id number]  Chief Complaint  Patient presents with   Head Injury    Pt states a cross fell on her head on Monday. Pt states the cross hit her right temple. Pt states having headache. No nausea or dizziness    HPI Patient is in today for head injury.  Discussed the use of AI scribe software for clinical note transcription with the patient, who gave verbal consent to proceed.  History of Present Illness Heather [name] is a 63 year old female who presents with headaches following a head injury.  She sustained a head injury on Monday when a large cross fell and hit her on the head while she was out of town. She describes the impact as a 'thump' and notes that it did not cause any bleeding. Since the incident, she has been experiencing persistent headaches despite taking Tylenol , which only provides temporary relief. No nausea or vomiting is present.  She recently underwent cataract surgery and has been trying to keep her eye open despite the headaches. Her current medications include asthma medication and Tylenol , which she takes as needed for headache relief. She also mentions having oatmeal for breakfast around 6 AM.    Past Medical History:  Diagnosis Date   Acute bronchitis    Allergic rhinitis    Allergy    Anemia    Anxiety    Arthritis    knees   Back pain    Glaucoma    Hyperlipidemia    Hypertension    Joint pain    Leg edema    Neuromuscular disorder (HCC)    nerve issues in legs    Prediabetes     Past Surgical History:  Procedure Laterality Date   ABDOMINAL HYSTERECTOMY  1998   BREAST LUMPECTOMY     CESAREAN SECTION     COLONOSCOPY     MYOMECTOMY     POLYPECTOMY      Family History  Problem Relation Age of Onset   Dementia Father    Stroke Father    Atrial fibrillation Father    Alzheimer's disease Father    Hyperlipidemia Father    Hypertension  Father    Heart disease Father    Prostate cancer Father 69   Hyperlipidemia Brother    Hypertension Brother    Colon polyps Brother    Hyperlipidemia Brother    Hypertension Brother    Hypertension Brother    Pulmonary embolism Brother    Cancer - Colon Brother 34       colon CA--stage 4   Colon cancer Brother    Colon cancer Cousin 72   Hypertension Mother    Hyperlipidemia Mother    Heart disease Mother    Endometrial cancer Other    Atrial fibrillation Other    Diabetes Maternal Aunt    Diabetes Maternal Uncle    Kidney disease Maternal Uncle        dialysis   Uterine cancer Maternal Grandmother    Cancer Maternal Grandfather        pancreatic   Pancreatic cancer Maternal Grandfather    Esophageal cancer Neg Hx    Rectal cancer Neg Hx    Stomach cancer Neg Hx     Social History   Socioeconomic History   Marital status: Single    Spouse name: Not on file   Number of children:  Not on file   Years of education: 20   Highest education level: Master's degree (e.g., MA, MS, MEng, MEd, MSW, MBA)  Occupational History   Occupation: IT consultant lab  Tobacco Use   Smoking status: Never   Smokeless tobacco: Never  Vaping Use   Vaping status: Never Used  Substance and Sexual Activity   Alcohol use: Yes    Alcohol/week: 0.0 standard drinks of alcohol    Comment: occ   Drug use: No   Sexual activity: Yes    Partners: Male  Other Topics Concern   Not on file  Social History Narrative   Exercise-- every night min30+min burning exercise --  min 2x a week    Social Drivers of Corporate investment banker Strain: Low Risk  (01/20/2024)   Overall Financial Resource Strain (CARDIA)    Difficulty of Paying Living Expenses: Not hard at all  Food Insecurity: No Food Insecurity (01/20/2024)   Hunger Vital Sign    Worried About Running Out of Food in the Last Year: Never true    Ran Out of Food in the Last Year: Never true  Transportation Needs: No  Transportation Needs (01/20/2024)   PRAPARE - Administrator, Civil Service (Medical): No    Lack of Transportation (Non-Medical): No  Physical Activity: Insufficiently Active (01/20/2024)   Exercise Vital Sign    Days of Exercise per Week: 4 days    Minutes of Exercise per Session: 30 min  Stress: No Stress Concern Present (01/20/2024)   [employer] of Occupational Health - Occupational Stress Questionnaire    Feeling of Stress: Only a little  Social Connections: Moderately Integrated (01/20/2024)   Social Connection and Isolation Panel    Frequency of Communication with Friends and Family: Three times a week    Frequency of Social Gatherings with Friends and Family: Once a week    Attends Religious Services: 1 to 4 times per year    Active Member of [employer] or Organizations: Yes    Attends Engineer, structural: More than 4 times per year    Marital Status: Divorced  Catering manager Violence: Not on file    Outpatient Medications Prior to Visit  Medication Sig Dispense Refill   ALPRAZolam  (XANAX ) 0.25 MG tablet Take 1 tablet (0.25 mg total) by mouth 2 (two) times daily as needed for anxiety. 20 tablet 0   cholecalciferol (VITAMIN D3) 25 MCG (1000 UNIT) tablet Take 1,000 Units by mouth daily.     cyclobenzaprine  (FLEXERIL ) 10 MG tablet Take 1 tablet by mouth three times daily as needed for muscle spasm 30 tablet 0   diclofenac  sodium (VOLTAREN ) 1 % GEL APPLY 4 GRAMS  TOPICALLY 4 TIMES DAILY 100 g 0   fluticasone  (FLONASE ) 50 MCG/ACT nasal spray Place 2 sprays into both nostrils daily. 48 g 1   folic acid  (FOLVITE ) 1 MG tablet Take 1 tablet (1 mg total) by mouth daily. 90 tablet 3   furosemide  (LASIX ) 20 MG tablet Take 1 tablet (20 mg total) by mouth daily. 90 tablet 0   Lactobacillus (PROBIOTIC ACIDOPHILUS PO) Take by mouth.     latanoprost (XALATAN) 0.005 % ophthalmic solution      levocetirizine (XYZAL ) 5 MG tablet Take 1 tablet (5 mg total) by mouth every  evening. 30 tablet 5   lisinopril  (ZESTRIL ) 10 MG tablet Take 1 tablet (10 mg total) by mouth daily. 90 tablet 1   Multiple Vitamin (MULTIVITAMIN) tablet Take 1 tablet  by mouth daily.     Naproxen Sod-diphenhydrAMINE (ALEVE PM PO) Take 1 tablet by mouth at bedtime as needed.     rosuvastatin  (CRESTOR ) 10 MG tablet Take 1 tablet by mouth once daily 90 tablet 1   tirzepatide  (MOUNJARO ) 10 MG/0.5ML Pen Inject 10 mg into the skin once a week. 6 mL 2   TURMERIC PO Take by mouth.     zinc gluconate 50 MG tablet Take 50 mg by mouth daily.     metoprolol  tartrate (LOPRESSOR ) 50 MG tablet Take 1 tablet (50 mg total) by mouth as directed. Take TWO HOURS before CT test (Patient not taking: Reported on 01/22/2024) 1 tablet 0   No facility-administered medications prior to visit.    Allergies  Allergen Reactions   Oseltamivir Phosphate Other (See Comments)    Couldn't walk. Blood shot eyes    Review of Systems  Constitutional:  Negative for fever and malaise/fatigue.  HENT:  Negative for congestion.   Eyes:  Negative for blurred vision.  Respiratory:  Negative for cough and shortness of breath.   Cardiovascular:  Negative for chest pain, palpitations and leg swelling.  Gastrointestinal:  Negative for vomiting.  Musculoskeletal:  Negative for back pain.  Skin:  Negative for rash.  Neurological:  Positive for headaches. Negative for loss of consciousness.       Objective:    Physical Exam Vitals and nursing note reviewed.  Constitutional:      Appearance: Normal appearance.  HENT:     Head:      Comments: Small hematoma---  tender to touch Neurological:     Mental Status: She is alert.     BP 100/70 (BP Location: Left Arm, Patient Position: Sitting, Cuff Size: Large)   Pulse 68   Temp 98 F (36.7 C) (Oral)   Resp 18   Ht 5' 5 (1.651 m)   Wt 249 lb 6.4 oz (113.1 kg)   SpO2 97%   BMI 41.50 kg/m  Wt Readings from Last 3 Encounters:  01/22/24 249 lb 6.4 oz (113.1 kg)  12/31/23  250 lb 12.8 oz (113.8 kg)  07/24/23 250 lb (113.4 kg)    Diabetic Foot Exam - Simple   No data filed    Lab Results  Component Value Date   WBC 4.8 07/24/2023   HGB 13.1 07/24/2023   HCT 40.4 07/24/2023   PLT 162.0 07/24/2023   GLUCOSE 80 07/24/2023   CHOL 156 07/24/2023   TRIG 72.0 07/24/2023   HDL 57.20 07/24/2023   LDLDIRECT 128.0 01/03/2013   LDLCALC 85 07/24/2023   ALT 22 07/24/2023   AST 19 07/24/2023   NA 142 07/24/2023   K 4.7 07/24/2023   CL 102 07/24/2023   CREATININE 0.81 07/24/2023   BUN 16 07/24/2023   CO2 32 07/24/2023   TSH 0.95 01/23/2023   HGBA1C 5.5 12/31/2023   MICROALBUR <0.7 07/24/2023    Lab Results  Component Value Date   TSH 0.95 01/23/2023   Lab Results  Component Value Date   WBC 4.8 07/24/2023   HGB 13.1 07/24/2023   HCT 40.4 07/24/2023   MCV 76.7 (L) 07/24/2023   PLT 162.0 07/24/2023   Lab Results  Component Value Date   NA 142 07/24/2023   K 4.7 07/24/2023   CO2 32 07/24/2023   GLUCOSE 80 07/24/2023   BUN 16 07/24/2023   CREATININE 0.81 07/24/2023   BILITOT 0.4 07/24/2023   ALKPHOS 61 07/24/2023   AST 19 07/24/2023   ALT  22 07/24/2023   PROT 7.0 07/24/2023   ALBUMIN 4.4 07/24/2023   CALCIUM  9.7 07/24/2023   ANIONGAP 7 02/16/2020   EGFR 84 11/11/2022   GFR 76.92 07/24/2023   Lab Results  Component Value Date   CHOL 156 07/24/2023   Lab Results  Component Value Date   HDL 57.20 07/24/2023   Lab Results  Component Value Date   LDLCALC 85 07/24/2023   Lab Results  Component Value Date   TRIG 72.0 07/24/2023   Lab Results  Component Value Date   CHOLHDL 3 07/24/2023   Lab Results  Component Value Date   HGBA1C 5.5 12/31/2023       Assessment & Plan:  Traumatic injury of head, initial encounter Assessment & Plan: Ct head ordered  If inc headaches or other worsening symptoms return to office or go to ER  Orders: -     CT HEAD WO CONTRAST ( ); Future -     CBC with Differential/Platelet -      Comprehensive metabolic panel with GFR -     Lipid panel  Essential hypertension -     CBC with Differential/Platelet -     Comprehensive metabolic panel with GFR -     Lipid panel  Diabetes mellitus type II, non insulin  dependent (HCC) -     Comprehensive metabolic panel with GFR -     Hemoglobin A1c  Hyperlipidemia associated with type 2 diabetes mellitus (HCC) Assessment & Plan: Encourage heart healthy diet such as MIND or DASH diet, increase exercise, avoid trans fats, simple carbohydrates and processed foods, consider a krill or fish or flaxseed oil cap daily.    Orders: -     CBC with Differential/Platelet -     Comprehensive metabolic panel with GFR -     Lipid panel  Primary hypertension Assessment & Plan: Well controlled, no changes to meds. Encouraged heart healthy diet such as the DASH diet and exercise as tolerated.     Assessment and Plan Assessment & Plan Head injury with persistent headache   She sustained a head injury on Monday when a cross fell and hit her head. Persistent headaches have been somewhat alleviated by Tylenol . There is tenderness upon palpation of the affected area, but no nausea or vomiting. Order a CT scan of the head to assess for intracranial injury. Perform blood work in preparation for potential contrast use during the CT scan.    Cambri Plourde R Lowne Chase, DO

## 2024-01-22 NOTE — Assessment & Plan Note (Addendum)
 Ct head ordered  If inc headaches or other worsening symptoms return to office or go to ER

## 2024-01-26 ENCOUNTER — Encounter: Payer: Self-pay | Admitting: Family Medicine

## 2024-01-26 ENCOUNTER — Ambulatory Visit: Admitting: Family Medicine

## 2024-01-26 VITALS — BP 122/82 | HR 78 | Temp 98.1°F | Resp 18 | Ht 65.0 in | Wt 247.0 lb

## 2024-01-26 DIAGNOSIS — Z7985 Long-term (current) use of injectable non-insulin antidiabetic drugs: Secondary | ICD-10-CM

## 2024-01-26 DIAGNOSIS — I10 Essential (primary) hypertension: Secondary | ICD-10-CM

## 2024-01-26 DIAGNOSIS — E1169 Type 2 diabetes mellitus with other specified complication: Secondary | ICD-10-CM | POA: Diagnosis not present

## 2024-01-26 DIAGNOSIS — E785 Hyperlipidemia, unspecified: Secondary | ICD-10-CM

## 2024-01-26 DIAGNOSIS — S0990XA Unspecified injury of head, initial encounter: Secondary | ICD-10-CM

## 2024-01-26 DIAGNOSIS — E119 Type 2 diabetes mellitus without complications: Secondary | ICD-10-CM

## 2024-01-26 NOTE — Progress Notes (Signed)
 Subjective:    Patient ID: Heather [name], female    DOB: 1960-12-01, 63 y.o.   MRN: [id number]  Chief Complaint  Patient presents with   Hypertension   Hyperlipidemia   Follow-up    HPI Patient is in today for f/u bp, chol and dm.  Discussed the use of AI scribe software for clinical note transcription with the patient, who gave verbal consent to proceed.  History of Present Illness Heather [name] is a 63 year old female who presents for a six-month follow-up visit.  She experienced a recent head injury resulting in a concussion. Initially, she had headaches and soreness, but these symptoms have since resolved, leaving only tenderness in the area.  She is concerned about her recent blood test results, noting a slightly low white blood cell count of 3.9 and a slightly high red blood cell count of 5.3. Her cholesterol levels are good, and her A1c is well-controlled.  She underwent cataract surgery with lens replacement in both eyes in July and August, with the procedures performed two weeks apart.  She recently received her flu shot and COVID-19 vaccine, with the COVID-19 vaccine administered three weeks ago.  Her current medications include Mounjaro , rosuvastatin , lisinopril , and furosemide . She is not taking metoprolol  and reports no issues with Mounjaro , expressing satisfaction with her current dose.    Past Medical History:  Diagnosis Date   Acute bronchitis    Allergic rhinitis    Allergy    Anemia    Anxiety    Arthritis    knees   Back pain    Glaucoma    Hyperlipidemia    Hypertension    Joint pain    Leg edema    Neuromuscular disorder (HCC)    nerve issues in legs    Prediabetes     Past Surgical History:  Procedure Laterality Date   ABDOMINAL HYSTERECTOMY  1998   BREAST LUMPECTOMY     CATARACT EXTRACTION, BILATERAL  10/2023   Dr Austin   CESAREAN SECTION     COLONOSCOPY     MYOMECTOMY     POLYPECTOMY      Family History  Problem  Relation Age of Onset   Dementia Father    Stroke Father    Atrial fibrillation Father    Alzheimer's disease Father    Hyperlipidemia Father    Hypertension Father    Heart disease Father    Prostate cancer Father 52   Hyperlipidemia Brother    Hypertension Brother    Colon polyps Brother    Hyperlipidemia Brother    Hypertension Brother    Hypertension Brother    Pulmonary embolism Brother    Cancer - Colon Brother 59       colon CA--stage 4   Colon cancer Brother    Colon cancer Cousin 66   Hypertension Mother    Hyperlipidemia Mother    Heart disease Mother    Endometrial cancer Other    Atrial fibrillation Other    Diabetes Maternal Aunt    Diabetes Maternal Uncle    Kidney disease Maternal Uncle        dialysis   Uterine cancer Maternal Grandmother    Cancer Maternal Grandfather        pancreatic   Pancreatic cancer Maternal Grandfather    Esophageal cancer Neg Hx    Rectal cancer Neg Hx    Stomach cancer Neg Hx     Social History   Socioeconomic History  Marital status: Single    Spouse name: Not on file   Number of children: Not on file   Years of education: 20   Highest education level: Master's degree (e.g., MA, MS, MEng, MEd, MSW, MBA)  Occupational History   Occupation: IT consultant lab  Tobacco Use   Smoking status: Never   Smokeless tobacco: Never  Vaping Use   Vaping status: Never Used  Substance and Sexual Activity   Alcohol use: Yes    Alcohol/week: 0.0 standard drinks of alcohol    Comment: occ   Drug use: No   Sexual activity: Yes    Partners: Male  Other Topics Concern   Not on file  Social History Narrative   Exercise-- every night min30+min burning exercise --  min 2x a week    Social Drivers of Corporate investment banker Strain: Low Risk  (01/20/2024)   Overall Financial Resource Strain (CARDIA)    Difficulty of Paying Living Expenses: Not hard at all  Food Insecurity: No Food Insecurity (01/20/2024)    Hunger Vital Sign    Worried About Running Out of Food in the Last Year: Never true    Ran Out of Food in the Last Year: Never true  Transportation Needs: No Transportation Needs (01/20/2024)   PRAPARE - Administrator, Civil Service (Medical): No    Lack of Transportation (Non-Medical): No  Physical Activity: Insufficiently Active (01/20/2024)   Exercise Vital Sign    Days of Exercise per Week: 4 days    Minutes of Exercise per Session: 30 min  Stress: No Stress Concern Present (01/20/2024)   [employer] of Occupational Health - Occupational Stress Questionnaire    Feeling of Stress: Only a little  Social Connections: Moderately Integrated (01/20/2024)   Social Connection and Isolation Panel    Frequency of Communication with Friends and Family: Three times a week    Frequency of Social Gatherings with Friends and Family: Once a week    Attends Religious Services: 1 to 4 times per year    Active Member of [employer] or Organizations: Yes    Attends Engineer, structural: More than 4 times per year    Marital Status: Divorced  Catering manager Violence: Not on file    Outpatient Medications Prior to Visit  Medication Sig Dispense Refill   ALPRAZolam  (XANAX ) 0.25 MG tablet Take 1 tablet (0.25 mg total) by mouth 2 (two) times daily as needed for anxiety. 20 tablet 0   cholecalciferol (VITAMIN D3) 25 MCG (1000 UNIT) tablet Take 1,000 Units by mouth daily.     cyclobenzaprine  (FLEXERIL ) 10 MG tablet Take 1 tablet by mouth three times daily as needed for muscle spasm 30 tablet 0   diclofenac  sodium (VOLTAREN ) 1 % GEL APPLY 4 GRAMS  TOPICALLY 4 TIMES DAILY 100 g 0   fluticasone  (FLONASE ) 50 MCG/ACT nasal spray Place 2 sprays into both nostrils daily. 48 g 1   folic acid  (FOLVITE ) 1 MG tablet Take 1 tablet (1 mg total) by mouth daily. 90 tablet 3   furosemide  (LASIX ) 20 MG tablet Take 1 tablet (20 mg total) by mouth daily. 90 tablet 0   Lactobacillus (PROBIOTIC ACIDOPHILUS  PO) Take by mouth.     latanoprost (XALATAN) 0.005 % ophthalmic solution      levocetirizine (XYZAL ) 5 MG tablet Take 1 tablet (5 mg total) by mouth every evening. 30 tablet 5   lisinopril  (ZESTRIL ) 10 MG tablet Take 1 tablet (10 mg  total) by mouth daily. 90 tablet 1   Multiple Vitamin (MULTIVITAMIN) tablet Take 1 tablet by mouth daily.     Naproxen Sod-diphenhydrAMINE (ALEVE PM PO) Take 1 tablet by mouth at bedtime as needed.     rosuvastatin  (CRESTOR ) 10 MG tablet Take 1 tablet by mouth once daily 90 tablet 1   tirzepatide  (MOUNJARO ) 10 MG/0.5ML Pen Inject 10 mg into the skin once a week. 6 mL 2   TURMERIC PO Take by mouth.     zinc gluconate 50 MG tablet Take 50 mg by mouth daily.     metoprolol  tartrate (LOPRESSOR ) 50 MG tablet Take 1 tablet (50 mg total) by mouth as directed. Take TWO HOURS before CT test (Patient not taking: Reported on 01/22/2024) 1 tablet 0   No facility-administered medications prior to visit.    Allergies  Allergen Reactions   Oseltamivir Phosphate Other (See Comments)    Couldn't walk. Blood shot eyes    Review of Systems  Constitutional:  Negative for fever and malaise/fatigue.  HENT:  Negative for congestion.   Eyes:  Negative for blurred vision.  Respiratory:  Negative for shortness of breath.   Cardiovascular:  Negative for chest pain, palpitations and leg swelling.  Gastrointestinal:  Negative for abdominal pain, blood in stool and nausea.  Genitourinary:  Negative for dysuria and frequency.  Musculoskeletal:  Negative for falls.  Skin:  Negative for rash.  Neurological:  Negative for dizziness, loss of consciousness and headaches.  Endo/Heme/Allergies:  Negative for environmental allergies.  Psychiatric/Behavioral:  Negative for depression. The patient is not nervous/anxious.        Objective:    Physical Exam Vitals and nursing note reviewed.  Constitutional:      General: She is not in acute distress.    Appearance: Normal appearance. She  is well-developed.  HENT:     Head: Normocephalic and atraumatic.  Eyes:     General: No scleral icterus.       Right eye: No discharge.        Left eye: No discharge.  Cardiovascular:     Rate and Rhythm: Normal rate and regular rhythm.     Heart sounds: No murmur heard. Pulmonary:     Effort: Pulmonary effort is normal. No respiratory distress.     Breath sounds: Normal breath sounds.  Musculoskeletal:        General: Normal range of motion.     Cervical back: Normal range of motion and neck supple.     Right lower leg: No edema.     Left lower leg: No edema.  Skin:    General: Skin is warm and dry.  Neurological:     Mental Status: She is alert and oriented to person, place, and time.  Psychiatric:        Mood and Affect: Mood normal.        Behavior: Behavior normal.        Thought Content: Thought content normal.        Judgment: Judgment normal.     BP 122/82 (BP Location: Left Arm, Patient Position: Sitting, Cuff Size: Large)   Pulse 78   Temp 98.1 F (36.7 C) (Oral)   Resp 18   Ht 5' 5 (1.651 m)   Wt 247 lb (112 kg)   SpO2 97%   BMI 41.10 kg/m  Wt Readings from Last 3 Encounters:  01/26/24 247 lb (112 kg)  01/22/24 249 lb 6.4 oz (113.1 kg)  12/31/23 250 lb 12.8  oz (113.8 kg)    Diabetic Foot Exam - Simple   No data filed    Lab Results  Component Value Date   WBC 3.9 (L) 01/22/2024   HGB 13.1 01/22/2024   HCT 40.8 01/22/2024   PLT 163.0 01/22/2024   GLUCOSE 81 01/22/2024   CHOL 153 01/22/2024   TRIG 55.0 01/22/2024   HDL 58.50 01/22/2024   LDLDIRECT 128.0 01/03/2013   LDLCALC 84 01/22/2024   ALT 14 01/22/2024   AST 15 01/22/2024   NA 140 01/22/2024   K 3.9 01/22/2024   CL 103 01/22/2024   CREATININE 0.76 01/22/2024   BUN 16 01/22/2024   CO2 30 01/22/2024   TSH 0.95 01/23/2023   HGBA1C 5.9 01/22/2024   MICROALBUR <0.7 07/24/2023    Lab Results  Component Value Date   TSH 0.95 01/23/2023   Lab Results  Component Value Date   WBC  3.9 (L) 01/22/2024   HGB 13.1 01/22/2024   HCT 40.8 01/22/2024   MCV 76.3 (L) 01/22/2024   PLT 163.0 01/22/2024   Lab Results  Component Value Date   NA 140 01/22/2024   K 3.9 01/22/2024   CO2 30 01/22/2024   GLUCOSE 81 01/22/2024   BUN 16 01/22/2024   CREATININE 0.76 01/22/2024   BILITOT 0.3 01/22/2024   ALKPHOS 54 01/22/2024   AST 15 01/22/2024   ALT 14 01/22/2024   PROT 7.2 01/22/2024   ALBUMIN 4.3 01/22/2024   CALCIUM  9.5 01/22/2024   ANIONGAP 7 02/16/2020   EGFR 84 11/11/2022   GFR 82.74 01/22/2024   Lab Results  Component Value Date   CHOL 153 01/22/2024   Lab Results  Component Value Date   HDL 58.50 01/22/2024   Lab Results  Component Value Date   LDLCALC 84 01/22/2024   Lab Results  Component Value Date   TRIG 55.0 01/22/2024   Lab Results  Component Value Date   CHOLHDL 3 01/22/2024   Lab Results  Component Value Date   HGBA1C 5.9 01/22/2024       Assessment and Plan Assessment & Plan Type 2 diabetes mellitus   Diabetes management is well-controlled with Mounjaro , as A1c levels indicate effective glycemic control. Continue Mounjaro .  Essential hypertension   Continue lisinopril  and furosemide .  Bilateral cataract surgery with lens replacement   Bilateral cataract surgery with lens replacement was completed in July and August. Follow-up visits with the ophthalmologist have been conducted.  General Health Maintenance   She recently received her flu shot and COVID-19 vaccination. Mammogram results from the 12th were good. Schedule the next physical examination for the end of April or beginning of May.   Caisley Baxendale R Lowne Chase, DO

## 2024-01-26 NOTE — Assessment & Plan Note (Signed)
 Encourage heart healthy diet such as MIND or DASH diet, increase exercise, avoid trans fats, simple carbohydrates and processed foods, consider a krill or fish or flaxseed oil cap daily.

## 2024-01-26 NOTE — Assessment & Plan Note (Signed)
 Well controlled, no changes to meds. Encouraged heart healthy diet such as the DASH diet and exercise as tolerated.

## 2024-01-26 NOTE — Assessment & Plan Note (Signed)
 hgba1c to be checked, minimize simple carbs. Increase exercise as tolerated. Continue current meds

## 2024-03-12 ENCOUNTER — Other Ambulatory Visit: Payer: Self-pay | Admitting: Family Medicine

## 2024-03-12 DIAGNOSIS — I10 Essential (primary) hypertension: Secondary | ICD-10-CM

## 2024-03-14 ENCOUNTER — Other Ambulatory Visit: Payer: Self-pay | Admitting: Family Medicine

## 2024-04-04 ENCOUNTER — Telehealth: Admitting: Physician Assistant

## 2024-04-04 ENCOUNTER — Ambulatory Visit: Payer: Self-pay

## 2024-04-04 DIAGNOSIS — B9689 Other specified bacterial agents as the cause of diseases classified elsewhere: Secondary | ICD-10-CM

## 2024-04-04 MED ORDER — AMOXICILLIN-POT CLAVULANATE 875-125 MG PO TABS: 1.0000 | ORAL_TABLET | Freq: Two times a day (BID) | ORAL | 0 refills | Status: AC

## 2024-04-04 MED ORDER — PROMETHAZINE-DM 6.25-15 MG/5ML PO SYRP: 5.0000 mL | ORAL_SOLUTION | Freq: Four times a day (QID) | ORAL | 0 refills | Status: AC | PRN

## 2024-04-04 NOTE — Patient Instructions (Signed)
 Heather [name], thank you for joining Delon CHRISTELLA Dickinson, PA-C for today's virtual visit.  While this provider is not your primary care provider (PCP), if your PCP is located in our provider database this encounter information will be shared with them immediately following your visit.   A [facility] MyChart account gives you access to today's visit and all your visits, tests, and labs performed at [facility]  click here if you don't have a [facility] MyChart account or go to [email or web address]  Consent: (Patient) Heather [name] provided verbal consent for this virtual visit at the beginning of the encounter.  Current Medications:  Current Outpatient Medications:    amoxicillin -clavulanate (AUGMENTIN ) 875-125 MG tablet, Take 1 tablet by mouth 2 (two) times daily., Disp: 14 tablet, Rfl: 0   promethazine -dextromethorphan (PROMETHAZINE -DM) 6.25-15 MG/5ML syrup, Take 5 mLs by mouth 4 (four) times daily as needed., Disp: 118 mL, Rfl: 0   ALPRAZolam  (XANAX ) 0.25 MG tablet, Take 1 tablet (0.25 mg total) by mouth 2 (two) times daily as needed for anxiety., Disp: 20 tablet, Rfl: 0   cholecalciferol (VITAMIN D3) 25 MCG (1000 UNIT) tablet, Take 1,000 Units by mouth daily., Disp: , Rfl:    cyclobenzaprine  (FLEXERIL ) 10 MG tablet, Take 1 tablet by mouth three times daily as needed for muscle spasm, Disp: 30 tablet, Rfl: 0   diclofenac  sodium (VOLTAREN ) 1 % GEL, APPLY 4 GRAMS  TOPICALLY 4 TIMES DAILY, Disp: 100 g, Rfl: 0   fluticasone  (FLONASE ) 50 MCG/ACT nasal spray, Place 2 sprays into both nostrils daily., Disp: 48 g, Rfl: 1   folic acid  (FOLVITE ) 1 MG tablet, Take 1 tablet (1 mg total) by mouth daily., Disp: 90 tablet, Rfl: 3   furosemide  (LASIX ) 20 MG tablet, Take 1 tablet (20 mg total) by mouth daily., Disp: 90 tablet, Rfl: 0   Lactobacillus (PROBIOTIC ACIDOPHILUS PO), Take by mouth., Disp: , Rfl:    latanoprost (XALATAN) 0.005 % ophthalmic solution, , Disp: , Rfl:     levocetirizine (XYZAL ) 5 MG tablet, Take 1 tablet (5 mg total) by mouth every evening., Disp: 30 tablet, Rfl: 5   lisinopril  (ZESTRIL ) 10 MG tablet, Take 1 tablet by mouth once daily, Disp: 90 tablet, Rfl: 1   Multiple Vitamin (MULTIVITAMIN) tablet, Take 1 tablet by mouth daily., Disp: , Rfl:    Naproxen Sod-diphenhydrAMINE (ALEVE PM PO), Take 1 tablet by mouth at bedtime as needed., Disp: , Rfl:    rosuvastatin  (CRESTOR ) 10 MG tablet, Take 1 tablet (10 mg total) by mouth daily., Disp: 90 tablet, Rfl: 1   tirzepatide  (MOUNJARO ) 10 MG/0.5ML Pen, Inject 10 mg into the skin once a week., Disp: 6 mL, Rfl: 2   TURMERIC PO, Take by mouth., Disp: , Rfl:    zinc gluconate 50 MG tablet, Take 50 mg by mouth daily., Disp: , Rfl:    Medications ordered in this encounter:  Meds ordered this encounter  Medications   amoxicillin -clavulanate (AUGMENTIN ) 875-125 MG tablet    Sig: Take 1 tablet by mouth 2 (two) times daily.    Dispense:  14 tablet    Refill:  0    Supervising Provider:   LAMPTEY, PHILIP O [[id number]]   promethazine -dextromethorphan (PROMETHAZINE -DM) 6.25-15 MG/5ML syrup    Sig: Take 5 mLs by mouth 4 (four) times daily as needed.    Dispense:  118 mL    Refill:  0    Supervising Provider:   BLAISE ALEENE KIDD [id number]     *If you need  refills on other medications prior to your next appointment, please contact your pharmacy*  Follow-Up: Call back or seek an in-person evaluation if the symptoms worsen or if the condition fails to improve as anticipated.  [facility] [phone]  Other Instructions  Sinus Infection, Adult A sinus infection, also called sinusitis, is inflammation of your sinuses. Sinuses are hollow spaces in the bones around your face. Your sinuses are located: Around your eyes. In the middle of your forehead. Behind your nose. In your cheekbones. Mucus normally drains out of your sinuses. When your nasal tissues become inflamed or swollen, mucus can  become trapped or blocked. This allows bacteria, viruses, and fungi to grow, which leads to infection. Most infections of the sinuses are caused by a virus. A sinus infection can develop quickly. It can last for up to 4 weeks (acute) or for more than 12 weeks (chronic). A sinus infection often develops after a cold. What are the causes? This condition is caused by anything that creates swelling in the sinuses or stops mucus from draining. This includes: Allergies. Asthma. Infection from bacteria or viruses. Deformities or blockages in your nose or sinuses. Abnormal growths in the nose (nasal polyps). Pollutants, such as chemicals or irritants in the air. Infection from fungi. This is rare. What increases the risk? You are more likely to develop this condition if you: Have a weak body defense system (immune system). Do a lot of swimming or diving. Overuse nasal sprays. Smoke. What are the signs or symptoms? The main symptoms of this condition are pain and a feeling of pressure around the affected sinuses. Other symptoms include: Stuffy nose or congestion that makes it difficult to breathe through your nose. Thick yellow or greenish drainage from your nose. Tenderness, swelling, and warmth over the affected sinuses. A cough that may get worse at night. Decreased sense of smell and taste. Extra mucus that collects in the throat or the back of the nose (postnasal drip) causing a sore throat or bad breath. Tiredness (fatigue). Fever. How is this diagnosed? This condition is diagnosed based on: Your symptoms. Your medical history. A physical exam. Tests to find out if your condition is acute or chronic. This may include: Checking your nose for nasal polyps. Viewing your sinuses using a device that has a light (endoscope). Testing for allergies or bacteria. Imaging tests, such as an MRI or CT scan. In rare cases, a bone biopsy may be done to rule out more serious types of fungal sinus  disease. How is this treated? Treatment for a sinus infection depends on the cause and whether your condition is chronic or acute. If caused by a virus, your symptoms should go away on their own within 10 days. You may be given medicines to relieve symptoms. They include: Medicines that shrink swollen nasal passages (decongestants). A spray that eases inflammation of the nostrils (topical intranasal corticosteroids). Rinses that help get rid of thick mucus in your nose (nasal saline washes). Medicines that treat allergies (antihistamines). Over-the-counter pain relievers. If caused by bacteria, your health care provider may recommend waiting to see if your symptoms improve. Most bacterial infections will get better without antibiotic medicine. You may be given antibiotics if you have: A severe infection. A weak immune system. If caused by narrow nasal passages or nasal polyps, surgery may be needed. Follow these instructions at home: Medicines Take, use, or apply over-the-counter and prescription medicines only as told by your health care provider. These may include nasal  sprays. If you were prescribed an antibiotic medicine, take it as told by your health care provider. Do not stop taking the antibiotic even if you start to feel better. Hydrate and humidify  Drink enough fluid to keep your urine pale yellow. Staying hydrated will help to thin your mucus. Use a cool mist humidifier to keep the humidity level in your home above 50%. Inhale steam for 10-15 minutes, 3-4 times a day, or as told by your health care provider. You can do this in the bathroom while a hot shower is running. Limit your exposure to cool or dry air. Rest Rest as much as possible. Sleep with your head raised (elevated). Make sure you get enough sleep each night. General instructions  Apply a warm, moist washcloth to your face 3-4 times a day or as told by your health care provider. This will help with  discomfort. Use nasal saline washes as often as told by your health care provider. Wash your hands often with soap and water to reduce your exposure to germs. If soap and water are not available, use hand sanitizer. Do not smoke. Avoid being around people who are smoking (secondhand smoke). Keep all follow-up visits. This is important. Contact a health care provider if: You have a fever. Your symptoms get worse. Your symptoms do not improve within 10 days. Get help right away if: You have a severe headache. You have persistent vomiting. You have severe pain or swelling around your face or eyes. You have vision problems. You develop confusion. Your neck is stiff. You have trouble breathing. These symptoms may be an emergency. Get help right away. Call 911. Do not wait to see if the symptoms will go away. Do not drive yourself to the hospital. Summary A sinus infection is soreness and inflammation of your sinuses. Sinuses are hollow spaces in the bones around your face. This condition is caused by nasal tissues that become inflamed or swollen. The swelling traps or blocks the flow of mucus. This allows bacteria, viruses, and fungi to grow, which leads to infection. If you were prescribed an antibiotic medicine, take it as told by your health care provider. Do not stop taking the antibiotic even if you start to feel better. Keep all follow-up visits. This is important. This information is not intended to replace advice given to you by your health care provider. Make sure you discuss any questions you have with your health care provider. Document Revised: 03/12/2021 Document Reviewed: 03/12/2021 Elsevier Patient Education  2024 Elsevier Inc.   If you have been instructed to have an in-person evaluation today at a local Urgent Care facility, please use the link below. It will take you to a list of all of our available [facility], including address, phone number and hours of  operation. Please do not delay care.  [facility]  If you or a family member do not have a primary care provider, use the link below to schedule a visit and establish care. When you choose a [facility] primary care physician or advanced practice provider, you gain a long-term partner in health. Find a Primary Care Provider  Learn more about [facility] in-office and virtual care options: [facility] Get Care Now

## 2024-04-04 NOTE — Telephone Encounter (Signed)
 FYI Only or Action Required?: FYI only for provider: appointment scheduled on 12.15.25 virtual UC.  Patient was last seen in primary care on 01/26/2024 by Antonio Meth, Jamee SAUNDERS, DO.  Called Nurse Triage reporting Facial Pain and Cough.  Symptoms began several days ago.  Interventions attempted: OTC medications: mucinex , zyrtec .  Symptoms are: gradually worsening.  Triage Disposition: See HCP Within 4 Hours (Or PCP Triage)  Patient/caregiver understands and will follow disposition?: Yes   Copied from CRM #[id number]. Topic: Clinical - Red Word Triage >> Apr 04, 2024 10:27 AM Roselie BROCKS wrote: [facility] that prompted transfer to Nurse Triage: Patient states she has bad pain around her eye sockets, coughing up phlegm and feels bad and achy. Reason for Disposition  [1] SEVERE sinus pain (e.g., excruciating) AND [2] not improved 2 hours after pain medicine  Answer Assessment - Initial Assessment Questions Pt states that she was in [place]  and came on home Saturday. That night began to have wheezing, coughing, headache. She states she has so much pain around her eye sockets she just wants to keep her eyes closed. She has tried zyrtec  and mucinex . Pt denies chest pain, shortness of breath, or fever.    1. LOCATION: Where does it hurt?      Above and below both eyes 2. ONSET: When did the sinus pain start?  (e.g., hours, days)      Saturday 3. SEVERITY: How bad is the pain?   (Scale 0-10; or none, mild, moderate or severe)     severe 4. NASAL CONGESTION: Is the nose blocked? If Yes, ask: Can you open it or must you breathe through your mouth?     yes 5. NASAL DISCHARGE: Do you have discharge from your nose? If so ask, What color?     She has a cough unsure what color 6. FEVER: Do you have a fever? If Yes, ask: What is it, how was it measured, and when did it start?      States she has not measured but had chills yesterday 7 OTHER SYMPTOMS: Do you have any other  symptoms? (e.g., sore throat, cough, earache, difficulty breathing)     Cough, denies chest pain or shortness of breath  Answer Assessment - Initial Assessment Questions 1. ONSET: When did the cough begin?      Saturday 2. SEVERITY: How bad is the cough today?      Mild to mod 3. SPUTUM: Describe the color of your sputum (e.g., none, dry cough; clear, white, yellow, green)     Unsure  4. HEMOPTYSIS: Are you coughing up any blood? If Yes, ask: How much? (e.g., flecks, streaks, tablespoons, etc.)     no 5. DIFFICULTY BREATHING: Are you having difficulty breathing? If Yes, ask: How bad is it? (e.g., mild, moderate, severe)      denies 6. FEVER: Do you have a fever? If Yes, ask: What is your temperature, how was it measured, and when did it start?     Hasn't measured but had chills yesterday 7. CARDIAC HISTORY: Do you have any history of heart disease? (e.g., heart attack, congestive heart failure)      cad 8. LUNG HISTORY: Do you have any history of lung disease?  (e.g., pulmonary embolus, asthma, emphysema)     no 9. PE RISK FACTORS: Do you have a history of blood clots? (or: recent major surgery, recent prolonged travel, bedridden)    Recent trip to [place].  10. OTHER SYMPTOMS: Do you have any other  symptoms? (e.g., runny nose, wheezing, chest pain)       Wheezing on Saturday- none since.  Protocols used: Cough - Acute Productive-A-AH, Sinus Pain or Congestion-A-AH

## 2024-04-04 NOTE — Telephone Encounter (Signed)
 Telehealth appt scheduled.

## 2024-04-04 NOTE — Progress Notes (Signed)
 Virtual Visit Consent   Heather [name], you are scheduled for a virtual visit with a [facility] provider today. Just as with appointments in the office, your consent must be obtained to participate. Your consent will be active for this visit and any virtual visit you may have with one of our providers in the next 365 days. If you have a MyChart account, a copy of this consent can be sent to you electronically.  As this is a virtual visit, video technology does not allow for your provider to perform a traditional examination. This may limit your provider's ability to fully assess your condition. If your provider identifies any concerns that need to be evaluated in person or the need to arrange testing (such as labs, EKG, etc.), we will make arrangements to do so. Although advances in technology are sophisticated, we cannot ensure that it will always work on either your end or our end. If the connection with a video visit is poor, the visit may have to be switched to a telephone visit. With either a video or telephone visit, we are not always able to ensure that we have a secure connection.  By engaging in this virtual visit, you consent to the provision of healthcare and authorize for your insurance to be billed (if applicable) for the services provided during this visit. Depending on your insurance coverage, you may receive a charge related to this service.  I need to obtain your verbal consent now. Are you willing to proceed with your visit today? Heather [name] has provided verbal consent on 04/04/2024 for a virtual visit (video or telephone). Heather Heather Dickinson, PA-C  Date: 04/04/2024 4:29 PM   Virtual Visit via Video Note   I, Heather [name], connected with  Heather [name]  ([id number], 1960/08/15) on 04/04/2024 at  4:15 PM EST by a video-enabled telemedicine application and verified that I am speaking with the correct person using two identifiers.  Location: Patient: Virtual  Visit Location Patient: Home Provider: Virtual Visit Location Provider: Home Office   I discussed the limitations of evaluation and management by telemedicine and the availability of in person appointments. The patient expressed understanding and agreed to proceed.    History of Present Illness: Heather [name] is a 63 y.o. who identifies as a female who was assigned female at birth, and is being seen today for sinus.  HPI: Sinusitis This is a new problem. The current episode started in the past 7 days. The problem has been gradually worsening since onset. Maximum temperature: Subjective fever yesterday, felt chilled. Associated symptoms include chills, congestion, coughing, headaches, shortness of breath and sinus pressure. Pertinent negatives include no ear pain or sore throat. (wheezing) Treatments tried: zyrtec  and mucinex . The treatment provided no relief.     Problems:  Patient Active Problem List   Diagnosis Date Noted   Anxiety 09/16/2022   Abscessed tooth 04/18/2022   Lower extremity edema 04/18/2022   Hyperlipidemia 04/18/2022   Suprapubic pain 04/18/2022   Class 3 severe obesity with serious comorbidity and body mass index (BMI) of 40.0 to 44.9 in adult, unspecified obesity type (HCC) 07/08/2021   Acute swimmer's ear of right side 12/03/2020   Alpha thalassemia 07/02/2020   Head trauma 01/17/2020   Abnormal CBC 01/17/2020   Allergies 02/24/2018   Diabetes mellitus type II, non insulin  dependent (HCC) 02/24/2018   Preventative health care 06/29/2016   CAD (coronary artery disease) 03/09/2014   Obesity (BMI 30-39.9) 01/07/2013   Acute  non-recurrent pansinusitis 04/19/2009   GLAUCOMA 08/24/2008   HYPERGLYCEMIA, FASTING 08/24/2008   POSTMENOPAUSAL STATUS 08/24/2008   EDEMA 06/07/2007   Essential hypertension 08/05/2006   ALLERGIC RHINITIS 08/05/2006   Hyperlipidemia associated with type 2 diabetes mellitus (HCC)    Chest pain     Allergies: Allergies[1] Medications:  Current Medications[2]  Observations/Objective: Patient is well-developed, well-nourished in no acute distress.  Resting comfortably at home.  Head is normocephalic, atraumatic.  No labored breathing. Speech is clear and coherent with logical content.  Patient is alert and oriented at baseline.    Assessment and Plan: 1. Acute bacterial sinusitis (Primary) - amoxicillin -clavulanate (AUGMENTIN ) 875-125 MG tablet; Take 1 tablet by mouth 2 (two) times daily.  Dispense: 14 tablet; Refill: 0 - promethazine -dextromethorphan (PROMETHAZINE -DM) 6.25-15 MG/5ML syrup; Take 5 mLs by mouth 4 (four) times daily as needed.  Dispense: 118 mL; Refill: 0  - Worsening symptoms that have not responded to OTC medications.  - Will give Augmentin  - Promethazine  DM for cough - Continue allergy medications.  - Steam and humidifier can help - Stay well hydrated and get plenty of rest.  - Seek in person evaluation if no symptom improvement or if symptoms worsen   Follow Up Instructions: I discussed the assessment and treatment plan with the patient. The patient was provided an opportunity to ask questions and all were answered. The patient agreed with the plan and demonstrated an understanding of the instructions.  A copy of instructions were sent to the patient via MyChart unless otherwise noted below.    The patient was advised to call back or seek an in-person evaluation if the symptoms worsen or if the condition fails to improve as anticipated.    Heather [name] M Heather Bentler, PA-C     [1]  Allergies Allergen Reactions   Oseltamivir Phosphate Other (See Comments)    Couldn't walk. Blood shot eyes  [2]  Current Outpatient Medications:    amoxicillin -clavulanate (AUGMENTIN ) 875-125 MG tablet, Take 1 tablet by mouth 2 (two) times daily., Disp: 14 tablet, Rfl: 0   promethazine -dextromethorphan (PROMETHAZINE -DM) 6.25-15 MG/5ML syrup, Take 5 mLs by mouth 4 (four) times daily as needed., Disp: 118 mL, Rfl: 0    ALPRAZolam  (XANAX ) 0.25 MG tablet, Take 1 tablet (0.25 mg total) by mouth 2 (two) times daily as needed for anxiety., Disp: 20 tablet, Rfl: 0   cholecalciferol (VITAMIN D3) 25 MCG (1000 UNIT) tablet, Take 1,000 Units by mouth daily., Disp: , Rfl:    cyclobenzaprine  (FLEXERIL ) 10 MG tablet, Take 1 tablet by mouth three times daily as needed for muscle spasm, Disp: 30 tablet, Rfl: 0   diclofenac  sodium (VOLTAREN ) 1 % GEL, APPLY 4 GRAMS  TOPICALLY 4 TIMES DAILY, Disp: 100 g, Rfl: 0   fluticasone  (FLONASE ) 50 MCG/ACT nasal spray, Place 2 sprays into both nostrils daily., Disp: 48 g, Rfl: 1   folic acid  (FOLVITE ) 1 MG tablet, Take 1 tablet (1 mg total) by mouth daily., Disp: 90 tablet, Rfl: 3   furosemide  (LASIX ) 20 MG tablet, Take 1 tablet (20 mg total) by mouth daily., Disp: 90 tablet, Rfl: 0   Lactobacillus (PROBIOTIC ACIDOPHILUS PO), Take by mouth., Disp: , Rfl:    latanoprost (XALATAN) 0.005 % ophthalmic solution, , Disp: , Rfl:    levocetirizine (XYZAL ) 5 MG tablet, Take 1 tablet (5 mg total) by mouth every evening., Disp: 30 tablet, Rfl: 5   lisinopril  (ZESTRIL ) 10 MG tablet, Take 1 tablet by mouth once daily, Disp: 90 tablet, Rfl: 1  Multiple Vitamin (MULTIVITAMIN) tablet, Take 1 tablet by mouth daily., Disp: , Rfl:    Naproxen Sod-diphenhydrAMINE (ALEVE PM PO), Take 1 tablet by mouth at bedtime as needed., Disp: , Rfl:    rosuvastatin  (CRESTOR ) 10 MG tablet, Take 1 tablet (10 mg total) by mouth daily., Disp: 90 tablet, Rfl: 1   tirzepatide  (MOUNJARO ) 10 MG/0.5ML Pen, Inject 10 mg into the skin once a week., Disp: 6 mL, Rfl: 2   TURMERIC PO, Take by mouth., Disp: , Rfl:    zinc gluconate 50 MG tablet, Take 50 mg by mouth daily., Disp: , Rfl:

## 2024-04-06 ENCOUNTER — Other Ambulatory Visit (HOSPITAL_BASED_OUTPATIENT_CLINIC_OR_DEPARTMENT_OTHER): Payer: Self-pay

## 2024-04-06 ENCOUNTER — Ambulatory Visit: Admitting: Student

## 2024-04-06 ENCOUNTER — Ambulatory Visit: Payer: Self-pay

## 2024-04-06 ENCOUNTER — Encounter: Payer: Self-pay | Admitting: Student

## 2024-04-06 VITALS — BP 138/77 | HR 105 | Temp 101.1°F | Ht 65.0 in | Wt 249.6 lb

## 2024-04-06 DIAGNOSIS — R509 Fever, unspecified: Secondary | ICD-10-CM | POA: Diagnosis not present

## 2024-04-06 DIAGNOSIS — J3489 Other specified disorders of nose and nasal sinuses: Secondary | ICD-10-CM

## 2024-04-06 DIAGNOSIS — J069 Acute upper respiratory infection, unspecified: Secondary | ICD-10-CM

## 2024-04-06 LAB — POCT INFLUENZA A/B
Influenza A, POC: NEGATIVE
Influenza B, POC: NEGATIVE

## 2024-04-06 LAB — POC COVID19 BINAXNOW: SARS Coronavirus 2 Ag: NEGATIVE

## 2024-04-06 MED ORDER — FLUTICASONE PROPIONATE 50 MCG/ACT NA SUSP
2.0000 | Freq: Every day | NASAL | 6 refills | Status: AC
  Filled 2024-04-06: qty 16, 30d supply, fill #0

## 2024-04-06 MED ORDER — BENZONATATE 100 MG PO CAPS
100.0000 mg | ORAL_CAPSULE | Freq: Three times a day (TID) | ORAL | 0 refills | Status: AC | PRN
  Filled 2024-04-06: qty 30, 10d supply, fill #0

## 2024-04-06 MED ORDER — ACETAMINOPHEN 325 MG PO TABS
650.0000 mg | ORAL_TABLET | Freq: Once | ORAL | Status: AC
  Administered 2024-04-06: 15:00:00 650 mg via ORAL

## 2024-04-06 NOTE — Progress Notes (Signed)
 No chief complaint on file.  Discussed the use of AI scribe software for clinical note transcription with the patient, who gave verbal consent to proceed.   Heather [name] here for URI complaints.  History of Present Illness Heather [name] is a 62 year old female who presents with a persistent cough and nasal congestion following a recent trip to [place] .  She developed a cough on the night of her return from [place]  on Saturday, April 02, 2024. Symptoms were mild while traveling but worsened after she returned. The cough is persistent, sometimes productive, and severe enough to wake her at night, with nasal congestion as well. She started Augmentin  04/04/2024 after a video visit. She has a fever but no shortness of breath, myalgias, or sore throat. She has used Mucinex  for relief and has not used her Flonase . Symptoms have been present for about four days since her return.  Patient denies chills, SOB, CP, palpitations, dyspnea, edema, HA, vision changes, N/V/D, abdominal pain.  Past Medical History:  Diagnosis Date   Acute bronchitis    Allergic rhinitis    Allergy    Anemia    Anxiety    Arthritis    knees   Back pain    Glaucoma    Hyperlipidemia    Hypertension    Joint pain    Leg edema    Neuromuscular disorder (HCC)    nerve issues in legs    Prediabetes     Objective BP 138/77   Pulse (!) 105   Temp (!) 101.1 F (38.4 C)   Ht 5' 5 (1.651 m)   Wt 249 lb 9.6 oz (113.2 kg)   SpO2 96%   BMI 41.54 kg/m  General: Awake, alert, appears stated age HEENT: AT, [place], ears patent b/l and TM's neg, nares patent w/o discharge, pharynx pink and without exudates, MMM Neck: No masses or asymmetry Heart: RRR Lungs: CTAB, no accessory muscle use Psych: Age appropriate judgment and insight, normal mood and affect  Rhinorrhea  Fever, unspecified fever cause - Plan: POC COVID-19 BinaxNow, POCT Influenza A/B, acetaminophen  (TYLENOL ) tablet 650 mg  Upper  respiratory tract infection, unspecified type Negative Covid and FLU.  - Continue Augmentin  as prescribed. - Use Tylenol  every 4-6 hours, up to 3000 mg per day, for fever and symptom management. - Increase fluid intake. - Use Flonase  for nasal congestion. - Consider Tessalon  Perles for cough suppression, up to three times a day. - Use Afrin for severe nasal drainage, not exceeding three days to avoid rebound congestion. - Continue Mucinex  OTC - Monitor for worsening symptoms indicating possible secondary bacterial infection, Pt educated on this Continue to push fluids, practice good hand hygiene, cover mouth when coughing. F/u prn. If starting to experience fevers, shaking, or shortness of breath, seek immediate care. Pt voiced understanding and agreement to the plan.  Heather LITTIE Jolly, DNP, AGNP-C 04/06/2024 3:25 PM

## 2024-04-06 NOTE — Telephone Encounter (Signed)
 FYI Only or Action Required?: FYI only for provider: appointment scheduled on 04/06/2024.  Patient was last seen in primary care on 04/04/2024 by Vivienne Delon HERO, PA-C.  Called Nurse Triage reporting Sinusitis.  Symptoms began several days ago.  Interventions attempted: Prescription medications: amoxicillin -clavulanate (AUGMENTIN ) 875-125 MG tablet, promethazine -dextromethorphan (PROMETHAZINE -DM) 6.25-15 MG/5ML.  Symptoms are: unchanged.  Triage Disposition: See Physician Within 24 Hours  Patient/caregiver understands and will follow disposition?: Yes         Copied from CRM #[id number]. Topic: Clinical - Red Word Triage >> Apr 06, 2024  7:36 AM Heather [name] wrote: Red Word that prompted transfer to Nurse Triage: Received medicine on Monday for pneumonia and it is not helping feels like symptoms got worse, maybe bronchiolitis Reason for Disposition  [1] Taking antibiotic > 48 hours (2 days) AND [2] fever persists  Answer Assessment - Initial Assessment Questions This RN scheduled pt an appointment today in office. This RN educated pt on  new-worsening symptoms and when to call back/seek emergent care. Pt verbalized understanding and agrees to plan.    Pt started Augmentin  and Promethazine  on 12/15 after a virtual appointment.  Symptoms: headache, intermittent dry/productive cough, hissing noise with breathing, headache  Pt thinks she may have a fever, can't check as no thermometer. Pt has chills  Denies SOB, sinus pain, chest pain  Protocols used: Sinus Infection on Antibiotic Follow-up Call-A-AH

## 2024-04-06 NOTE — Telephone Encounter (Signed)
 Appt scheduled

## 2024-04-15 ENCOUNTER — Other Ambulatory Visit: Payer: Self-pay | Admitting: Family Medicine

## 2024-04-15 DIAGNOSIS — E119 Type 2 diabetes mellitus without complications: Secondary | ICD-10-CM

## 2024-04-29 ENCOUNTER — Other Ambulatory Visit: Payer: Self-pay | Admitting: Family Medicine

## 2024-04-29 DIAGNOSIS — R600 Localized edema: Secondary | ICD-10-CM

## 2024-07-26 ENCOUNTER — Encounter: Admitting: Family Medicine

## 2024-08-15 ENCOUNTER — Encounter: Admitting: Family Medicine
